# Patient Record
Sex: Female | Born: 1980 | Race: White | Hispanic: No | Marital: Single | Smoking: Former smoker
Health system: Southern US, Community
[De-identification: ages and names within clinical notes are randomized; demographics above are authoritative.]

## PROBLEM LIST (undated history)

## (undated) DIAGNOSIS — M5030 Other cervical disc degeneration, unspecified cervical region: Secondary | ICD-10-CM

## (undated) DIAGNOSIS — N1330 Unspecified hydronephrosis: Secondary | ICD-10-CM

## (undated) DIAGNOSIS — M791 Myalgia, unspecified site: Secondary | ICD-10-CM

## (undated) DIAGNOSIS — M2550 Pain in unspecified joint: Secondary | ICD-10-CM

## (undated) DIAGNOSIS — Z9114 Patient's other noncompliance with medication regimen: Secondary | ICD-10-CM

## (undated) DIAGNOSIS — E282 Polycystic ovarian syndrome: Secondary | ICD-10-CM

## (undated) DIAGNOSIS — F419 Anxiety disorder, unspecified: Secondary | ICD-10-CM

## (undated) DIAGNOSIS — F329 Major depressive disorder, single episode, unspecified: Secondary | ICD-10-CM

## (undated) DIAGNOSIS — M722 Plantar fascial fibromatosis: Secondary | ICD-10-CM

## (undated) DIAGNOSIS — F909 Attention-deficit hyperactivity disorder, unspecified type: Secondary | ICD-10-CM

## (undated) DIAGNOSIS — R918 Other nonspecific abnormal finding of lung field: Secondary | ICD-10-CM

## (undated) DIAGNOSIS — B009 Herpesviral infection, unspecified: Secondary | ICD-10-CM

## (undated) DIAGNOSIS — Z8719 Personal history of other diseases of the digestive system: Secondary | ICD-10-CM

## (undated) DIAGNOSIS — Z8711 Personal history of peptic ulcer disease: Secondary | ICD-10-CM

## (undated) DIAGNOSIS — K5190 Ulcerative colitis, unspecified, without complications: Secondary | ICD-10-CM

## (undated) HISTORY — DX: Pain in unspecified joint: M25.50

## (undated) HISTORY — DX: Other cervical disc degeneration, unspecified cervical region: M50.30

## (undated) HISTORY — DX: Personal history of other diseases of the digestive system: Z87.19

## (undated) HISTORY — DX: Unspecified hydronephrosis: N13.30

## (undated) HISTORY — DX: Myalgia, unspecified site: M79.10

## (undated) HISTORY — DX: Other nonspecific abnormal finding of lung field: R91.8

## (undated) HISTORY — PX: OTHER SURGICAL HISTORY: SHX169

## (undated) HISTORY — DX: Anxiety disorder, unspecified: F41.9

## (undated) HISTORY — DX: Attention-deficit hyperactivity disorder, unspecified type: F90.9

## (undated) HISTORY — DX: Plantar fascial fibromatosis: M72.2

## (undated) HISTORY — PX: PILONIDAL CYST EXCISION: SHX744

## (undated) HISTORY — DX: Herpesviral infection, unspecified: B00.9

## (undated) HISTORY — DX: Patient's other noncompliance with medication regimen: Z91.14

## (undated) HISTORY — DX: Major depressive disorder, single episode, unspecified: F32.9

## (undated) HISTORY — DX: Personal history of peptic ulcer disease: Z87.11

---
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
## 2006-12-09 ENCOUNTER — Observation Stay: Payer: Self-pay | Admitting: Obstetrics and Gynecology

## 2007-03-17 ENCOUNTER — Observation Stay: Payer: Self-pay | Admitting: Obstetrics and Gynecology

## 2007-03-27 ENCOUNTER — Inpatient Hospital Stay: Payer: Self-pay | Admitting: Obstetrics and Gynecology

## 2013-12-28 LAB — HM PAP SMEAR: HM Pap smear: NEGATIVE

## 2015-04-30 ENCOUNTER — Telehealth: Payer: Self-pay | Admitting: Obstetrics and Gynecology

## 2015-04-30 NOTE — Telephone Encounter (Signed)
[facility] [address] [address]

## 2015-04-30 NOTE — Telephone Encounter (Signed)
Patient would like a topical cream sent in for her hsv

## 2015-04-30 NOTE — Telephone Encounter (Signed)
PT CALLED AND WOULD LIKE A CALL BACK SHE NEEDS A REFILL CALLED IN FOR HER BUT TO A DIFFERENT PHARMACY. PT DID NOT TELL ME WHAT THE NEW PHARMACY IS ONLY THAT SHE WOULD LIKE A CALL BACK FROM YOU

## 2015-04-30 NOTE — Telephone Encounter (Signed)
Left message for patient to return call.

## 2015-05-01 ENCOUNTER — Other Ambulatory Visit: Payer: Self-pay | Admitting: Obstetrics and Gynecology

## 2015-05-01 DIAGNOSIS — R768 Other specified abnormal immunological findings in serum: Secondary | ICD-10-CM

## 2015-05-01 MED ORDER — ACYCLOVIR 5 % EX OINT: 1.0000 "application " | TOPICAL_OINTMENT | CUTANEOUS | Status: DC

## 2015-05-01 NOTE — Telephone Encounter (Signed)
Notified patient medication sent to pharmacy

## 2015-05-01 NOTE — Telephone Encounter (Signed)
Please let her know it was sent in today

## 2015-05-07 ENCOUNTER — Other Ambulatory Visit: Payer: Self-pay | Admitting: Obstetrics and Gynecology

## 2015-05-07 ENCOUNTER — Telehealth: Payer: Self-pay | Admitting: Obstetrics and Gynecology

## 2015-05-07 DIAGNOSIS — F988 Other specified behavioral and emotional disorders with onset usually occurring in childhood and adolescence: Secondary | ICD-10-CM

## 2015-05-07 MED ORDER — AMPHETAMINE-DEXTROAMPHETAMINE 20 MG PO TABS: 20.0000 mg | ORAL_TABLET | Freq: Two times a day (BID) | ORAL | Status: DC

## 2015-05-07 NOTE — Telephone Encounter (Signed)
thanks

## 2015-05-07 NOTE — Telephone Encounter (Signed)
Notified pt rx ready for pick up

## 2015-05-07 NOTE — Telephone Encounter (Signed)
Please let her know they are ready for pickup

## 2015-05-07 NOTE — Telephone Encounter (Signed)
Needs refill on adderal please

## 2015-05-08 NOTE — Telephone Encounter (Signed)
DONE. TINA

## 2015-05-21 ENCOUNTER — Ambulatory Visit (INDEPENDENT_AMBULATORY_CARE_PROVIDER_SITE_OTHER): Payer: BLUE CROSS/BLUE SHIELD | Admitting: Psychiatry

## 2015-05-21 ENCOUNTER — Encounter: Payer: Self-pay | Admitting: Psychiatry

## 2015-05-21 DIAGNOSIS — F331 Major depressive disorder, recurrent, moderate: Secondary | ICD-10-CM

## 2015-05-21 MED ORDER — BUPROPION HCL ER (XL) 300 MG PO TB24: 300.0000 mg | ORAL_TABLET | Freq: Every day | ORAL | Status: DC

## 2015-05-21 MED ORDER — QUETIAPINE FUMARATE 50 MG PO TABS: ORAL_TABLET | ORAL | Status: DC

## 2015-05-21 MED ORDER — ESCITALOPRAM OXALATE 20 MG PO TABS: 20.0000 mg | ORAL_TABLET | Freq: Every day | ORAL | Status: DC

## 2015-05-21 NOTE — Progress Notes (Signed)
Psychiatric Initial Adult Assessment   Patient Identification: Tracy Whitaker MRN:  [id number] Date of Evaluation:  05/21/2015 Referral Source: PCP Chief Complaint:  "I had a relationship just and in February." Chief Complaint    Depression     Visit Diagnosis: No diagnosis found. Diagnosis:  There are no active problems to display for this patient.  History of Present Illness:  Patient indicates that she had a relationship and in February after a 10 year period. She indicates she has an 69-year-old daughter with this person. He stated that since then she's been experiencing depressed mood, crying all the time, lack of interest, poor appetite and difficulty sleeping. She states she has been on Lexapro for 2 years and that her primary care added Wellbutrin last month. Patient indicates that the Wellbutrin has been somewhat helpful for the depression. She also states her primary care added Seroquel last month as well and that is helped her with her sleep. Of note patient indicates that she shares the custody of the daughter with this ex boyfriend and that she has now found out that the boyfriend has moved on and has a another girlfriend living in the home. She states this bothers her very much. She does still see her daughter as they split the days up evenly as to who the daughter stays with.  She indicated that in February and March she was somewhat hyper, was going out every night and had risky sexual behavior. She also states she was drinking heavily at that time and would drink until she blacked out and drink enough such that she had occasions where she fell asleep in the shower. She states that that level of drinking ended at the end of March and currently she consumes 3-4 beers a week.  Elements:  Duration:  Discussed above.. Associated Signs/Symptoms: Depression Symptoms:  depressed mood, anhedonia, insomnia, loss of energy/fatigue, disturbed sleep, decreased appetite, (Hypo) Manic  Symptoms:  Irritable Mood, Sexually Inapproprite Behavior, Decreased need for sleep. Ever patient relates these symptoms indicated at the end of March 2016 also concurrent with her stopping heavy use of alcohol. Anxiety Symptoms:  Panic Symptoms, Psychotic Symptoms:  none PTSD Symptoms: Negative  Past Medical History: No past medical history on file. No past surgical history on file. Family History: No family history on file. Social History:   History   Social History  . Marital Status: Single    Spouse Name: N/A  . Number of Children: N/A  . Years of Education: N/A   Social History Main Topics  . Smoking status: Not on file  . Smokeless tobacco: Not on file  . Alcohol Use: Not on file  . Drug Use: Not on file  . Sexual Activity: Not on file   Other Topics Concern  . Not on file   Social History Narrative  . No narrative on file   Additional Social History: Patient has been married in the past and divorced in 2003 areas she has an 81-year-old daughter from a long-term 10 year relationship.  Asian describes her childhood as good and denies any forms of abuse. She states her parents continue to be married. She has a younger sister. 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 She went to the [facility] dental assistance program and was working as a Art therapist up until recently.  She states that she had quit cigarettes for 2 years but then resumed smoking in December 2015, currently using 1 pack per day. She  denies any use of illicit drugs aside from the alcohol use discussed above.  Musculoskeletal: Strength & Muscle Tone: within normal limits Gait & Station: normal Patient leans: N/A  Psychiatric Specialty Exam: HPI  ROS  There were no vitals taken for this visit.There is no height or weight on file to calculate BMI.  General Appearance: Neat and Well Groomed  Eye Contact:  Good  Speech:  Normal Rate  Volume:  Normal  Mood:  Crying all the  time  Affect:  Depressed and Tearful  Thought Process:  Linear and Logical  Orientation:  Full (Time, Place, and Person)  Thought Content:  Negative  Suicidal Thoughts:  No  Homicidal Thoughts:  No  Memory:  Immediate;   Good Recent;   Good Remote;   Good  Judgement:  Good  Insight:  Good  Psychomotor Activity:  Negative  Concentration:  Good  Recall:  Good  Fund of Knowledge:Good  Language: Good  Akathisia:  Negative  Handed:  Right unknown   AIMS (if indicated): done, normal, no dentures or partials  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  No. Has the patient been a risk to self in the past 6 months?  No. Has the patient been a risk to self within the distant past?  Yes.   cut wrist in 2002 Is the patient a risk to others?  No. Has the patient been a risk to others in the past 6 months?  No. Has the patient been a risk to others within the distant past?  No.  Allergies:  No Known Allergies Current Medications: Current Outpatient Prescriptions  Medication Sig Dispense Refill  . acyclovir ointment (ZOVIRAX) 5 % Apply 1 application topically every 3 (three) hours. 15 g 4  . amphetamine-dextroamphetamine (ADDERALL) 20 MG tablet Take 1 tablet (20 mg total) by mouth 2 (two) times daily. 60 tablet 0  . buPROPion (WELLBUTRIN XL) 300 MG 24 hr tablet Take 1 tablet (300 mg total) by mouth daily. 30 tablet 0  . escitalopram (LEXAPRO) 20 MG tablet Take 1 tablet (20 mg total) by mouth daily. 30 tablet 0  . QUEtiapine (SEROQUEL) 50 MG tablet Take 2 tablets at night at bedtime. Use one half a tablet twice a day as needed for anxiety. 90 tablet 0   No current facility-administered medications for this visit.    Previous Psychotropic Medications: Yes   Substance Abuse History in the last 12 months:  Yes.    Consequences of Substance Abuse: Blackouts:     Medical Decision Making:  Established Problem, Worsening (2), Review of Medication Regimen & Side Effects (2) and Review of New Medication or Change in Dosage (2)  Treatment Plan Summary: Medication management we will increase her Wellbutrin XL from 150 mg a day to 300 mg a day. We will increase her Seroquel from 50 mg at bedtime to 100 mg at bedtime. We will continue her Lexapro at 20 mg daily. Patient will follow up in 1 month. I have also encouraged patient to make an appointment with her therapist to discuss relationship issues develop coping skills.  Guards to risk assessment the patient does have a past distant suicide attempt in 2002. Additional risk factors are affective illness and race. Protective factors are minor child that lives with her, female gender, reporting some benefit from medication, no current active substance use  disorder, forward thinking and describes being in a relationship at this time. At this time low risk of imminent harm to herself or others.    Faith Rogue 6/29/20162:24 PM

## 2015-05-27 ENCOUNTER — Ambulatory Visit (INDEPENDENT_AMBULATORY_CARE_PROVIDER_SITE_OTHER): Payer: BLUE CROSS/BLUE SHIELD | Admitting: Licensed Clinical Social Worker

## 2015-05-27 DIAGNOSIS — F331 Major depressive disorder, recurrent, moderate: Secondary | ICD-10-CM

## 2015-05-27 NOTE — Progress Notes (Signed)
Initial Assessment  Patient:   Tracy Whitaker   DOB:   September 12, 1981  MR Number:  [id number]  Location:  [facility], [address], [place], [place] [address]  Date of Service:   05/27/15  Start Time:   2:20 p.m. End Time:   3:30 p.m.  Provider/Observer:  Miguel Dibble, MSW, LCSW  Billing Code/Service:  [phone]  Chief Complaint:  "I cry all the time. I am sad."   Chief Complaint  Patient presents with  . Depression  . Anxiety  . Family Problem    Reason for Service:  Client here for initial outpatient therapy to learn effective strategies for managing symptoms of sadness, depression, anxiety and grief associated with loss of a long term relationship.  Current Status:  Symptomatic  Reliability of Information: Reliable  Behavioral Observation: Tracy Whitaker  presents as a 34 y.o.-year-old single white female here on referral from Dr. Jimmye Norman who she saw recently for medication evaluation.  No previous outpatient therapy per client however, in 2003, she reports that she had an inpatient hospital stay after mixing a large amount of alcohol and Klonopin following the ending of her marriage.  She saw a Psychiatrist for a while as an outpatient and then stopped.  Zenda reported "I stopped all of my Bi-Polar medicine and I was fine."  "I cry all the time. I am sad."  "My cat got attacked by a pitbull dog."  After several medical procedures the cat died.  She described her then boyfriend as non-supportive and mean to her rather than offering compassion.  The event that precipitated presenting symptoms and grief reaction started in February 2016, when herthen long term boyfriend of 10 and 1/2 years became emotionally and verbally neglectful and informed client that he did not love her or want to continue the relationship.  She and their 53 year old daughter were living with him in a house that he bought.  As a result of the break up she did  not have the financial resources to move into a place so she and daughter stayed temporarily with a friend.  She and daughter now share a one bedroom home and apparently client recently moved in her boyfriend into the home.  Her estranged ex is dating a woman and the woman and her son are living with him in the home.  Client was very tearful when stating "I feel like I'm being replaced."  Client voiced satisfaction that at least the girlfriend is kind to their daughter and although the ex continues to text client she admits "I would not want to go back with him."  Additional sadness around loss of an ideal which client described as the loss of having a family like she grew up in with parents still together after 74 plus years and her sister is also married with a family.  Additionally, there was reported infidelity in the relationship which client may not have completely dealt with.  Not wanting to leave her house and stated "I either sleep all the time or not at all."  She was extremely tearful in session. "I guess I just feel worthless at this time."  Denied feeling anxiety going to work since she has been there for 10 years and co-workers and boss are described as very supportive.  She is having panic attacks in stores to the point that she has had to leave a few times and describes self as feeling really  nervous.  Other symptoms per client include: irritable mood, rubbing places on her hands due to nervousness. Symptoms do not indicate SIB and no SI or HI indicated.  Client does not report manic symptoms.    She was receptive to therapy process and voiced agreement and understanding of role of understanding feelings and thoughts associated with loss and importance of self-care and increasing realistic expectations of self and others.  Tracy Whitaker agreed to read hand-outs provided and motivated to return to OPT.  Interactions:    Active   Attention:   within normal limits  Memory:   within normal  limits  Visuo-spatial:   within normal limits  Speech (Volume):  normal  Speech:   normal volume  Thought Process:  Relevant  Though Content:  WNL  Orientation:   person, place, time/date, situation, day of week, month of year and year  Judgment:   Fair  Planning:   Fair  Affect:    Anxious, Depressed and Tearful  Mood:    Anxious and Depressed  Insight:   Fair  Intelligence:   normal  Marital Status/Living: Singe/Living with current boyfriend whom she has known for years. The two live together with client's 42 year old daughter, Minette Brine, in the home 50% of the time and with her father the other 50% of the time.  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  Past Employment:    Substance Use:  There is a documented history of alcohol abuse confirmed by the patient.  She admits that she was drinking heavily to point of blacking out.  The time frame was about 4-6 weeks.  Denied as current behaviors.  Education:   Engineering geologist certificate for Art therapist  Medical History:  No past medical history on file. Polycystic Fibrosis (history of miscarriage)      Current outpatient prescriptions:  .  acyclovir ointment (ZOVIRAX) 5 %, Apply 1 application topically every 3 (three) hours., Disp: 15 g, Rfl: 4 .  amphetamine-dextroamphetamine (ADDERALL) 20 MG tablet, Take 1 tablet (20 mg total) by mouth 2 (two) times daily., Disp: 60 tablet, Rfl: 0 .  buPROPion (WELLBUTRIN XL) 300 MG 24 hr tablet, Take 1 tablet (300 mg total) by mouth daily., Disp: 30 tablet, Rfl: 0 .  escitalopram (LEXAPRO) 20 MG tablet, Take 1 tablet (20 mg total) by mouth daily., Disp: 30 tablet, Rfl: 0 .  QUEtiapine (SEROQUEL) 50 MG tablet, Take 1-1/2 tablets at bedtime for 1 week and then increase to 2 tablets at bedtime. Take one half a tablet twice a day as  needed for anxiety., Disp: 90 tablet, Rfl: 0  Sexual History:   History  Sexual Activity  . Sexual Activity: Not on file    Abuse/Trauma History: Denied  Psychiatric History:  Was hospitalized in 2003 after mixing Klonopin and alcohol and saw a Psychiatrist for a short time. Took Lexapro in the past.  Family Med/Psych History: No family history on file.  Risk of Suicide/Violence: low :  Protective factors: race, age, loves her daughter, family support, employed, motivated to get better  Therapist Response/Interventions:  LCSW provided client with supportive therapy with insight along with emotional and social support to establish trust and rapport.  Reviewed psychotherapy as a process of self discovery, healing and a place to gain additional self care and coping skills.  LCSW offered psycho-education on factors  that may contribute to client's ongoing depressive symptoms that appear to include real and perceived feelings of worthlessness and rejection.  Education and support around grieving loss of both relationship and the idea of having her own family and associated grief reactions.  Normalized client's feelings and offered her hand outs that discuss healing from a separation and divorce along with common thinking errors that contribute to depression and anxiety symptoms.  Encouraged client to review information for [place] to discuss at next session.  Explored with client and processed identified thoughts, feelings and fears associated with both real and imagined rejection and abandonment in personal relationships.  Supportive therapy to validate the discomfort of current situation while also normalizing this as part of disappointments, fears, confusion and other emotions when a relationship ends.  Impression/DX:  Major Depressive Disorder, Recurrent, Moderate     Anxiety  Plan:  Follow up for OPT in one week. LCSW will coordinate client's care with Dr. Jimmye Norman to maximize benefits of treatment  outcomes.  Client will keep all scheduled appointments and take medications as prescribed.  Tracy Whitaker will read all materials provided and complete homework as assigned between therapy sessions.  Diagnosis:   Major Depressive Disorder, Recurrent, Moderate

## 2015-06-06 ENCOUNTER — Ambulatory Visit: Payer: BLUE CROSS/BLUE SHIELD | Admitting: Licensed Clinical Social Worker

## 2015-06-16 ENCOUNTER — Ambulatory Visit: Payer: BLUE CROSS/BLUE SHIELD | Admitting: Psychiatry

## 2015-06-17 ENCOUNTER — Other Ambulatory Visit: Payer: Self-pay

## 2015-06-17 DIAGNOSIS — F331 Major depressive disorder, recurrent, moderate: Secondary | ICD-10-CM

## 2015-06-17 MED ORDER — QUETIAPINE FUMARATE 50 MG PO TABS: ORAL_TABLET | ORAL | Status: DC

## 2015-06-17 MED ORDER — BUPROPION HCL ER (XL) 300 MG PO TB24: 300.0000 mg | ORAL_TABLET | Freq: Every day | ORAL | Status: DC

## 2015-06-17 NOTE — Telephone Encounter (Signed)
received a fax requesting a refill on bupropion hcl xl 300mg  take 1 tablet by mouth daily. and on quetiapine fumarate 50mg  take 2 tablets at night at betime use one half a tablet twice da day as needed for anxiety.  Pt was seen on  06-16-15.

## 2015-06-18 NOTE — Telephone Encounter (Signed)
faxed rx to pharmacy. and i called pt and let them know to call office back to r/s appt that was missed.

## 2015-07-22 ENCOUNTER — Emergency Department: Payer: BLUE CROSS/BLUE SHIELD

## 2015-07-22 ENCOUNTER — Telehealth: Payer: Self-pay | Admitting: Obstetrics and Gynecology

## 2015-07-22 ENCOUNTER — Emergency Department
Admission: EM | Admit: 2015-07-22 | Discharge: 2015-07-22 | Disposition: A | Discharge status: Home / Self Care | Payer: BLUE CROSS/BLUE SHIELD | Attending: Emergency Medicine | Admitting: Emergency Medicine

## 2015-07-22 ENCOUNTER — Encounter: Payer: Self-pay | Admitting: Emergency Medicine

## 2015-07-22 DIAGNOSIS — S8011XA Contusion of right lower leg, initial encounter: Secondary | ICD-10-CM

## 2015-07-22 DIAGNOSIS — Z720 Tobacco use: Secondary | ICD-10-CM | POA: Insufficient documentation

## 2015-07-22 DIAGNOSIS — M7981 Nontraumatic hematoma of soft tissue: Secondary | ICD-10-CM | POA: Diagnosis not present

## 2015-07-22 DIAGNOSIS — T148XXA Other injury of unspecified body region, initial encounter: Secondary | ICD-10-CM

## 2015-07-22 DIAGNOSIS — Z79899 Other long term (current) drug therapy: Secondary | ICD-10-CM | POA: Diagnosis not present

## 2015-07-22 DIAGNOSIS — R51 Headache: Secondary | ICD-10-CM | POA: Insufficient documentation

## 2015-07-22 DIAGNOSIS — M79604 Pain in right leg: Secondary | ICD-10-CM | POA: Diagnosis present

## 2015-07-22 HISTORY — DX: Ulcerative colitis, unspecified, without complications: K51.90

## 2015-07-22 HISTORY — DX: Polycystic ovarian syndrome: E28.2

## 2015-07-22 NOTE — Telephone Encounter (Signed)
Still needs to be seen at ED or urgent care

## 2015-07-22 NOTE — ED Provider Notes (Signed)
[facility] Emergency Department Provider Note  ____________________________________________  Time seen: Approximately 2:00 PM  I have reviewed the triage vital signs and the nursing notes.   HISTORY  Chief Complaint Leg Pain    HPI Tracy Whitaker is a 34 y.o. female with a past medical history that includes ulcerative colitis and polycystic ovarian disease who presents with swelling and bruising to the medial aspect of her right calf.  She denies any trauma.  She states that she noticed acutely about 2 days ago and it has been persistent and "changed color "since that time.  It is mildly painful and tender to touch.  It does not radiate away from the site which is about the diameter of the palm of her hand.  It is firm to the touch.  She has been worrying about it and has also felt like she has a headache recently and just felt "not quite right".  She decided she should get it checked out.  Nothing makes it better but she has not tried taking any medications for it nor using any ice packs.  Nothing makes it worse.   Past Medical History  Diagnosis Date  . Ulcerative colitis   . Polycystic disease, ovaries     There are no active problems to display for this patient.   History reviewed. No pertinent past surgical history.  Current Outpatient Rx  Name  Route  Sig  Dispense  Refill  . acyclovir ointment (ZOVIRAX) 5 %   Topical   Apply 1 application topically every 3 (three) hours.   15 g   4   . amphetamine-dextroamphetamine (ADDERALL) 20 MG tablet   Oral   Take 1 tablet (20 mg total) by mouth 2 (two) times daily.   60 tablet   0   . buPROPion (WELLBUTRIN XL) 300 MG 24 hr tablet   Oral   Take 1 tablet (300 mg total) by mouth daily.   30 tablet   0   . escitalopram (LEXAPRO) 20 MG tablet   Oral   Take 1 tablet (20 mg total) by mouth daily.   30 tablet   0   . mesalamine (ASACOL) 400 MG EC tablet   Oral   Take 800 mg by mouth 3 (three)  times daily.         . QUEtiapine (SEROQUEL) 50 MG tablet      2 tablets at bedtime. Take one half a tablet twice a day as needed for anxiety.   90 tablet   0     Allergies Review of patient's allergies indicates no known allergies.  History reviewed. No pertinent family history.  Social History Social History  Substance Use Topics  . Smoking status: Current Every Day Smoker -- 1.00 packs/day    Types: Cigarettes  . Smokeless tobacco: None  . Alcohol Use: 2.4 oz/week    4 Standard drinks or equivalent per week    Review of Systems Constitutional: No fever/chills Eyes: No visual changes. ENT: No sore throat. Cardiovascular: Denies chest pain. Respiratory: Denies shortness of breath. Gastrointestinal: No abdominal pain.  No nausea, no vomiting.  No diarrhea.  No constipation. Genitourinary: Negative for dysuria. Musculoskeletal: Negative for back pain.  Mild pain in the right inner calf at the site of the swelling and discoloration Skin: Negative for rash. Neurological: Occasional mild headaches, no focal weakness or numbness.  10-point ROS otherwise negative.  ____________________________________________   PHYSICAL EXAM:  VITAL SIGNS: ED Triage Vitals  Enc  Vitals Group     BP 07/22/15 1219 116/74 mmHg     Pulse Rate 07/22/15 1219 94     Resp 07/22/15 1219 16     Temp 07/22/15 1219 98.8 F (37.1 C)     Temp Source 07/22/15 1219 Oral     SpO2 07/22/15 1219 100 %     Weight 07/22/15 1219 180 lb (81.647 kg)     Height 07/22/15 1219 5\' 4"  (1.626 m)     Head Cir --      Peak Flow --      Pain Score 07/22/15 1352 5     Pain Loc --      Pain Edu? --      Excl. in [place]? --     Constitutional: Alert and oriented. Well appearing and in no acute distress. Eyes: Conjunctivae are normal. PERRL. EOMI. Head: Atraumatic. Neck: No stridor.   Cardiovascular: Normal rate, regular rhythm. Grossly normal heart sounds.  Good peripheral circulation.  Normal capillary refill  in distal extremities including in the right toes. Respiratory: Normal respiratory effort.  No retractions. Lungs CTAB. Gastrointestinal: Soft and nontender. No distention. No abdominal bruits. No CVA tenderness. Musculoskeletal: Subacute ecchymosis and hematoma about the size of the palm of the patient's hand on her right occipital medial calf.  No evidence of erythema/cellulitis.  It is firm like a hematoma but still easily palpable with easily compressible compartments and no reported tenderness to palpation.  She is neurovascularly intact distal to the lesion.  There is no popliteal tenderness or swelling.  Left lower extremity is unremarkable Neurologic:  Normal speech and language. No gross focal neurologic deficits are appreciated.  Skin:  Skin is warm, dry and intact. No rash noted. Psychiatric: Mood and affect are normal. Speech and behavior are normal.  ____________________________________________   LABS (all labs ordered are listed, but only abnormal results are displayed)  Not indicated ____________________________________________  EKG  Not indicated ____________________________________________  RADIOLOGY   US Venous Img Lower Unilateral Right  07/22/2015   CLINICAL DATA:  Right leg pain and swelling for 3 days  EXAM: Right LOWER EXTREMITY VENOUS DOPPLER ULTRASOUND  TECHNIQUE: Gray-scale sonography with graded compression, as well as color Doppler and duplex ultrasound were performed to evaluate the lower extremity deep venous systems from the level of the common femoral vein and including the common femoral, femoral, profunda femoral, popliteal and calf veins including the posterior tibial, peroneal and gastrocnemius veins when visible. The superficial great saphenous vein was also interrogated. Spectral Doppler was utilized to evaluate flow at rest and with distal augmentation maneuvers in the common femoral, femoral and popliteal veins.  COMPARISON:  None.  FINDINGS:  Contralateral Common Femoral Vein: Respiratory phasicity is normal and symmetric with the symptomatic side. No evidence of thrombus. Normal compressibility.  Common Femoral Vein: No evidence of thrombus. Normal compressibility, respiratory phasicity and response to augmentation.  Saphenofemoral Junction: No evidence of thrombus.  Profunda Femoral Vein: No evidence of thrombus.  Femoral Vein: No evidence of thrombus.  Popliteal Vein: No evidence of thrombus.  Calf Veins: No evidence of thrombus.  Superficial Great Saphenous Vein: No evidence of thrombus.  IMPRESSION: No evidence of right lower extremity deep venous thrombosis.   Electronically Signed   By: Monte Fantasia M.D.   On: 07/22/2015 14:07    ____________________________________________   PROCEDURES  Procedure(s) performed: None  Critical Care performed: No ____________________________________________   INITIAL IMPRESSION / ASSESSMENT AND PLAN / ED COURSE  Pertinent labs &  imaging results that were available during my care of the patient were reviewed by me and considered in my medical decision making (see chart for details).  The ecchymosis and hematoma looks most consistent with either a minor trauma that the patient does not remember or a spontaneous rupture of a small blood vessel.  Either way it is nontender, has soft compartments, she is neurovascularly intact distal to the lesion, and there is no sign of infection.  I highly doubt deep vein thrombosis but given that she is on birth control pills and is a tobacco user it is reasonable to obtain the study.  Anticipating that the ultrasound will be negative I plan for discharge with normal recommendations and return precautions.  ____________________________________________  FINAL CLINICAL IMPRESSION(S) / ED DIAGNOSES  Final diagnoses:  Contusion of muscle  Hematoma of leg, right, initial encounter      NEW MEDICATIONS STARTED DURING THIS VISIT:  New Prescriptions   No  medications on file     Hinda Kehr, MD 07/22/15 [phone]

## 2015-07-22 NOTE — Telephone Encounter (Signed)
Patient called stating she thinks she may have a busted blood vessel or blood clot on the inside calf area of her leg. She stated its bruised purple and blue and red. Shes not sure if she has a fever but she feels hot and clammy. She does not have a PCP. Please Advise

## 2015-07-22 NOTE — Discharge Instructions (Signed)
Hematoma A hematoma is a collection of blood under the skin, in an organ, in a body space, in a joint space, or in other tissue. The blood can clot to form a lump that you can see and feel. The lump is often firm and may sometimes become sore and tender. Most hematomas get better in a few days to weeks. However, some hematomas may be serious and require medical care. Hematomas can range in size from very small to very large. CAUSES  A hematoma can be caused by a blunt or penetrating injury. It can also be caused by spontaneous leakage from a blood vessel under the skin. Spontaneous leakage from a blood vessel is more likely to occur in older people, especially those taking blood thinners. Sometimes, a hematoma can develop after certain medical procedures. SIGNS AND SYMPTOMS   A firm lump on the body.  Possible pain and tenderness in the area.  Bruising.Blue, dark blue, purple-red, or yellowish skin may appear at the site of the hematoma if the hematoma is close to the surface of the skin. For hematomas in deeper tissues or body spaces, the signs and symptoms may be subtle. For example, an intra-abdominal hematoma may cause abdominal pain, weakness, fainting, and shortness of breath. An intracranial hematoma may cause a headache or symptoms such as weakness, trouble speaking, or a change in consciousness. DIAGNOSIS  A hematoma can usually be diagnosed based on your medical history and a physical exam. Imaging tests may be needed if your health care provider suspects a hematoma in deeper tissues or body spaces, such as the abdomen, head, or chest. These tests may include ultrasonography or a CT scan.  TREATMENT  Hematomas usually go away on their own over time. Rarely does the blood need to be drained out of the body. Large hematomas or those that may affect vital organs will sometimes need surgical drainage or monitoring. HOME CARE INSTRUCTIONS   Apply ice to the injured area:   Put ice in a  plastic bag.   Place a towel between your skin and the bag.   Leave the ice on for 20 minutes, 2-3 times a day for the first 1 to 2 days.   After the first 2 days, switch to using warm compresses on the hematoma.   Elevate the injured area to help decrease pain and swelling. Wrapping the area with an elastic bandage may also be helpful. Compression helps to reduce swelling and promotes shrinking of the hematoma. Make sure the bandage is not wrapped too tight.   If your hematoma is on a lower extremity and is painful, crutches may be helpful for a couple days.   Only take over-the-counter or prescription medicines as directed by your health care provider. SEEK IMMEDIATE MEDICAL CARE IF:   You have increasing pain, or your pain is not controlled with medicine.   You have a fever.   You have worsening swelling or discoloration.   Your skin over the hematoma breaks or starts bleeding.   Your hematoma is in your chest or abdomen and you have weakness, shortness of breath, or a change in consciousness.  Your hematoma is on your scalp (caused by a fall or injury) and you have a worsening headache or a change in alertness or consciousness. MAKE SURE YOU:   Understand these instructions.  Will watch your condition.  Will get help right away if you are not doing well or get worse. Document Released: 06/22/2004 Document Revised: 07/11/2013 Document Reviewed: 04/18/2013   Exit[employer] Patient Information 2015 [place]. This information is not intended to replace advice given to you by your health care provider. Make sure you discuss any questions you have with your health care provider.  Contusion A contusion is a deep bruise. Contusions are the result of an injury that caused bleeding under the skin. The contusion may turn blue, purple, or yellow. Minor injuries will give you a painless contusion, but more severe contusions may stay painful and swollen for a few weeks.  CAUSES    A contusion is usually caused by a blow, trauma, or direct force to an area of the body. SYMPTOMS   Swelling and redness of the injured area.  Bruising of the injured area.  Tenderness and soreness of the injured area.  Pain. DIAGNOSIS  The diagnosis can be made by taking a history and physical exam. An X-ray, CT scan, or MRI may be needed to determine if there were any associated injuries, such as fractures. TREATMENT  Specific treatment will depend on what area of the body was injured. In general, the best treatment for a contusion is resting, icing, elevating, and applying cold compresses to the injured area. Over-the-counter medicines may also be recommended for pain control. Ask your caregiver what the best treatment is for your contusion. HOME CARE INSTRUCTIONS   Put ice on the injured area.  Put ice in a plastic bag.  Place a towel between your skin and the bag.  Leave the ice on for 15-20 minutes, 3-4 times a day, or as directed by your health care provider.  Only take over-the-counter or prescription medicines for pain, discomfort, or fever as directed by your caregiver. Your caregiver may recommend avoiding anti-inflammatory medicines (aspirin, ibuprofen, and naproxen) for 48 hours because these medicines may increase bruising.  Rest the injured area.  If possible, elevate the injured area to reduce swelling. SEEK IMMEDIATE MEDICAL CARE IF:   You have increased bruising or swelling.  You have pain that is getting worse.  Your swelling or pain is not relieved with medicines. MAKE SURE YOU:   Understand these instructions.  Will watch your condition.  Will get help right away if you are not doing well or get worse. Document Released: 08/18/2005 Document Revised: 11/13/2013 Document Reviewed: 09/13/2011 [facility] Patient Information 2015 [place], [place]. This information is not intended to replace advice given to you by your health care provider. Make sure you  discuss any questions you have with your health care provider.

## 2015-07-22 NOTE — Telephone Encounter (Signed)
Needs to go to ED or fast Med, as we cannot do ultrasound to rule out blood clot here, wheich is what she needs

## 2015-07-22 NOTE — Telephone Encounter (Signed)
Patient notified

## 2015-07-22 NOTE — ED Notes (Signed)
[place] gave preliminary report- negative for DVT in RLE

## 2015-07-22 NOTE — ED Notes (Signed)
Right lower leg pain and swelling onset Sunday.  Ambulates well.

## 2015-08-11 ENCOUNTER — Other Ambulatory Visit: Payer: Self-pay | Admitting: Obstetrics and Gynecology

## 2015-08-11 ENCOUNTER — Telehealth: Payer: Self-pay | Admitting: Obstetrics and Gynecology

## 2015-08-11 MED ORDER — AMPHETAMINE-DEXTROAMPHETAMINE 20 MG PO TABS: 20.0000 mg | ORAL_TABLET | Freq: Two times a day (BID) | ORAL | Status: DC

## 2015-08-11 NOTE — Telephone Encounter (Signed)
Please let her know she can pick her prescription up

## 2015-08-11 NOTE — Telephone Encounter (Signed)
pls advise

## 2015-08-11 NOTE — Telephone Encounter (Signed)
Patient called requesting a refill on adderral.Thanks

## 2015-08-12 NOTE — Telephone Encounter (Signed)
Left detailed message rx ready for pickup. 

## 2015-09-12 ENCOUNTER — Other Ambulatory Visit: Payer: Self-pay | Admitting: Obstetrics and Gynecology

## 2015-10-28 ENCOUNTER — Telehealth: Payer: Self-pay | Admitting: Obstetrics and Gynecology

## 2015-10-28 NOTE — Telephone Encounter (Signed)
pls advise

## 2015-10-28 NOTE — Telephone Encounter (Signed)
The pharmacy said her seroquil (generic) authorized again before they can fill it   [facility]

## 2015-10-29 NOTE — Telephone Encounter (Signed)
Ok to authorize- did they not send you a form?

## 2015-10-30 NOTE — Telephone Encounter (Signed)
Called and gave early refill

## 2015-10-31 ENCOUNTER — Ambulatory Visit (INDEPENDENT_AMBULATORY_CARE_PROVIDER_SITE_OTHER): Payer: BLUE CROSS/BLUE SHIELD | Admitting: Obstetrics and Gynecology

## 2015-10-31 ENCOUNTER — Ambulatory Visit (INDEPENDENT_AMBULATORY_CARE_PROVIDER_SITE_OTHER): Payer: BLUE CROSS/BLUE SHIELD

## 2015-10-31 ENCOUNTER — Encounter: Payer: Self-pay | Admitting: Obstetrics and Gynecology

## 2015-10-31 VITALS — BP 106/80 | HR 97 | Ht 64.0 in | Wt 187.9 lb

## 2015-10-31 DIAGNOSIS — R1032 Left lower quadrant pain: Secondary | ICD-10-CM | POA: Diagnosis not present

## 2015-10-31 LAB — POCT URINALYSIS DIPSTICK
Bilirubin, UA: NEGATIVE
Glucose, UA: NEGATIVE
Ketones, UA: 15
Leukocytes, UA: NEGATIVE
Nitrite, UA: NEGATIVE
Protein, UA: NEGATIVE
Spec Grav, UA: <=1.005
Urobilinogen, UA: 0.2
pH, UA: 7

## 2015-10-31 MED ORDER — NORGESTREL-ETHINYL ESTRADIOL 0.3-30 MG-MCG PO TABS: 1.0000 | ORAL_TABLET | Freq: Every day | ORAL | Status: DC

## 2015-10-31 MED ORDER — HYDROCODONE-ACETAMINOPHEN 5-325 MG PO TABS: 1.0000 | ORAL_TABLET | Freq: Four times a day (QID) | ORAL | Status: DC | PRN

## 2015-11-12 ENCOUNTER — Telehealth: Payer: Self-pay | Admitting: Obstetrics and Gynecology

## 2015-11-12 ENCOUNTER — Other Ambulatory Visit: Payer: Self-pay | Admitting: Obstetrics and Gynecology

## 2015-11-12 MED ORDER — QUETIAPINE FUMARATE 200 MG PO TABS: 200.0000 mg | ORAL_TABLET | Freq: Every day | ORAL | Status: DC

## 2015-11-12 MED ORDER — AMPHETAMINE-DEXTROAMPHETAMINE 20 MG PO TABS: 20.0000 mg | ORAL_TABLET | Freq: Two times a day (BID) | ORAL | Status: DC

## 2015-11-12 NOTE — Telephone Encounter (Signed)
pls advise

## 2015-11-12 NOTE — Telephone Encounter (Signed)
Please let her know they are ready to pick up

## 2015-11-12 NOTE — Telephone Encounter (Signed)
Left detailed message for pt rx ready

## 2015-11-12 NOTE — Telephone Encounter (Signed)
Patient needs a refill on seroquel [facility] mg [facility] [address] as well as a refill on adderrall, she is aware she will need to pick up the adderrall. Thanks

## 2015-11-14 ENCOUNTER — Other Ambulatory Visit: Payer: Self-pay | Admitting: Obstetrics and Gynecology

## 2015-11-19 ENCOUNTER — Other Ambulatory Visit: Payer: Self-pay | Admitting: *Deleted

## 2015-11-19 ENCOUNTER — Telehealth: Payer: Self-pay

## 2015-11-19 NOTE — Telephone Encounter (Signed)
Needs refill of meds please.  lexapro 20mg  qd Wellbutrin 150mg  qd  [facility] [address]  [employer]

## 2015-11-20 ENCOUNTER — Other Ambulatory Visit: Payer: Self-pay | Admitting: *Deleted

## 2015-11-20 MED ORDER — BUPROPION HCL ER (XL) 150 MG PO TB24: 150.0000 mg | ORAL_TABLET | Freq: Every day | ORAL | Status: DC

## 2015-11-20 NOTE — Telephone Encounter (Signed)
Done-ac 

## 2016-01-13 ENCOUNTER — Other Ambulatory Visit: Payer: Self-pay | Admitting: Obstetrics and Gynecology

## 2016-01-13 ENCOUNTER — Telehealth: Payer: Self-pay | Admitting: Obstetrics and Gynecology

## 2016-01-13 MED ORDER — BUPROPION HCL ER (XL) 150 MG PO TB24: 150.0000 mg | ORAL_TABLET | Freq: Every day | ORAL | Status: DC

## 2016-01-13 MED ORDER — QUETIAPINE FUMARATE 200 MG PO TABS: 200.0000 mg | ORAL_TABLET | Freq: Every day | ORAL | Status: DC

## 2016-01-13 NOTE — Telephone Encounter (Signed)
Tracy Whitaker needs her Seroquel 200 mg and Wellbutrin 150 mg refilled  [facility] [address]  (SHE SAID TODAY)

## 2016-01-13 NOTE — Telephone Encounter (Signed)
Ok to refill 

## 2016-01-18 ENCOUNTER — Other Ambulatory Visit: Payer: Self-pay | Admitting: Obstetrics and Gynecology

## 2016-02-09 ENCOUNTER — Telehealth: Payer: Self-pay | Admitting: Obstetrics and Gynecology

## 2016-02-09 ENCOUNTER — Other Ambulatory Visit (INDEPENDENT_AMBULATORY_CARE_PROVIDER_SITE_OTHER): Payer: BLUE CROSS/BLUE SHIELD

## 2016-02-09 DIAGNOSIS — N390 Urinary tract infection, site not specified: Secondary | ICD-10-CM

## 2016-02-09 LAB — POCT URINALYSIS DIPSTICK
Glucose, UA: NEGATIVE
Ketones, UA: NEGATIVE
Nitrite, UA: POSITIVE
Spec Grav, UA: 1.01
Urobilinogen, UA: 0.2
pH, UA: 6.5

## 2016-02-09 MED ORDER — NITROFURANTOIN MONOHYD MACRO 100 MG PO CAPS: 100.0000 mg | ORAL_CAPSULE | Freq: Two times a day (BID) | ORAL | Status: DC

## 2016-02-09 NOTE — Telephone Encounter (Signed)
PT CALLED AND NEEDS A REFILL ON HER ADDERRALL AND SHE STATED THAT THE WELLBUTRIN IS DIFFERENT SHE THINKS THAT IT IS NOT THE CORRECT DOSE WANTED THAT TO BE CHECKED

## 2016-02-10 ENCOUNTER — Other Ambulatory Visit: Payer: Self-pay | Admitting: Obstetrics and Gynecology

## 2016-02-10 MED ORDER — BUPROPION HCL ER (XL) 300 MG PO TB24: 300.0000 mg | ORAL_TABLET | Freq: Every day | ORAL | Status: DC

## 2016-02-10 MED ORDER — AMPHETAMINE-DEXTROAMPHETAMINE 20 MG PO TABS: 20.0000 mg | ORAL_TABLET | Freq: Two times a day (BID) | ORAL | Status: DC

## 2016-02-10 NOTE — Telephone Encounter (Signed)
Please let her know adderall RX is ready to pickup, and wellbutrin looks correct- may want to check with pharmacy to be sure it is the same that she had picked up previously.

## 2016-02-10 NOTE — Telephone Encounter (Signed)
Done-ac 

## 2016-02-10 NOTE — Telephone Encounter (Signed)
I called Tracy Whitaker states she was taking wellbutrin 340m qd, the last one she picked up was for 1552m she would like to take 30030m

## 2016-02-10 NOTE — Telephone Encounter (Signed)
Please tell her to double up on current script and I sent in another one for 328m.

## 2016-02-11 ENCOUNTER — Other Ambulatory Visit: Payer: Self-pay | Admitting: Obstetrics and Gynecology

## 2016-02-11 LAB — URINE CULTURE

## 2016-03-02 ENCOUNTER — Telehealth: Payer: Self-pay | Admitting: Obstetrics and Gynecology

## 2016-03-02 NOTE — Telephone Encounter (Signed)
She is cryselle and has been spotting for 2 weeks. She would like for you to call her.

## 2016-03-03 ENCOUNTER — Other Ambulatory Visit: Payer: BLUE CROSS/BLUE SHIELD

## 2016-03-03 NOTE — Telephone Encounter (Signed)
Called pt she thinks she has BV is coming by office 03/04/16 to do self obtained swab

## 2016-03-04 ENCOUNTER — Other Ambulatory Visit: Payer: Self-pay | Admitting: Obstetrics and Gynecology

## 2016-03-04 DIAGNOSIS — B9689 Other specified bacterial agents as the cause of diseases classified elsewhere: Secondary | ICD-10-CM

## 2016-03-04 DIAGNOSIS — N760 Acute vaginitis: Secondary | ICD-10-CM | POA: Insufficient documentation

## 2016-03-04 MED ORDER — METRONIDAZOLE 500 MG PO TABS: 500.0000 mg | ORAL_TABLET | Freq: Two times a day (BID) | ORAL | Status: DC

## 2016-03-04 NOTE — Telephone Encounter (Signed)
Pt did self obtained swab here in the office today thinks she has BV Request pills [facility]

## 2016-04-09 ENCOUNTER — Encounter: Payer: Self-pay | Admitting: Obstetrics and Gynecology

## 2016-04-16 ENCOUNTER — Other Ambulatory Visit: Payer: Self-pay | Admitting: Obstetrics and Gynecology

## 2016-04-16 ENCOUNTER — Encounter: Payer: Self-pay | Admitting: Obstetrics and Gynecology

## 2016-04-16 ENCOUNTER — Ambulatory Visit (INDEPENDENT_AMBULATORY_CARE_PROVIDER_SITE_OTHER): Payer: BLUE CROSS/BLUE SHIELD | Admitting: Obstetrics and Gynecology

## 2016-04-16 VITALS — BP 116/74 | HR 83 | Ht 64.0 in | Wt 178.2 lb

## 2016-04-16 DIAGNOSIS — F909 Attention-deficit hyperactivity disorder, unspecified type: Secondary | ICD-10-CM | POA: Insufficient documentation

## 2016-04-16 DIAGNOSIS — M25519 Pain in unspecified shoulder: Secondary | ICD-10-CM | POA: Diagnosis not present

## 2016-04-16 DIAGNOSIS — Z01419 Encounter for gynecological examination (general) (routine) without abnormal findings: Secondary | ICD-10-CM

## 2016-04-16 DIAGNOSIS — E669 Obesity, unspecified: Secondary | ICD-10-CM | POA: Diagnosis not present

## 2016-04-16 NOTE — Patient Instructions (Signed)
  Place annual gynecologic exam patient instructions here.  Thank you for enrolling in [facility]. Please follow the instructions below to securely access your online medical record. MyChart allows you to send messages to your doctor, view your test results, manage appointments, and more.   How Do I Sign Up? 1. In your Internet browser, go to [employer] and enter [email or web address]. 2. Click on the Sign Up Now link in the Sign In box. You will see the New Member Sign Up page. 3. Enter your MyChart Access Code exactly as it appears below. You will not need to use this code after you've completed the sign-up process. If you do not sign up before the expiration date, you must request a new code.  MyChart Access Code: [email or web address] Expires: 06/15/2016  3:20 PM  4. Enter your Social Security Number ([id number]) and Date of Birth (mm/dd/yyyy) as indicated and click Submit. You will be taken to the next sign-up page. 5. Create a MyChart ID. This will be your MyChart login ID and cannot be changed, so think of one that is secure and easy to remember. 6. Create a MyChart password. You can change your password at any time. 7. Enter your Password Reset Question and Answer. This can be used at a later time if you forget your password.  8. Enter your e-mail address. You will receive e-mail notification when new information is available in [place]. 9. Click Sign Up. You can now view your medical record.   Additional Information Remember, MyChart is NOT to be used for urgent needs. For medical emergencies, dial 911.

## 2016-04-16 NOTE — Progress Notes (Signed)
Subjective:   Tracy Whitaker is a 35 y.o. No obstetric history on file. Caucasian female here for a routine well-woman exam.  Patient's last menstrual period was 03/24/2016.    Current complaints: should and neck strain due to large breast size, hoped it would get bettr with weight loss, but hasn't. Causes headaches due to neck strain. PCP: me       does desire labs  Social History: Sexual: heterosexual Marital Status: single Living situation: with daughter Occupation: unknown occupation Tobacco/alcohol: no caffeine use Illicit drugs: no history of illicit drug use  The following portions of the patient's history were reviewed and updated as appropriate: allergies, current medications, past family history, past medical history, past social history, past surgical history and problem list.  Past Medical History Past Medical History  Diagnosis Date  . Ulcerative colitis ([place])   . Polycystic disease, ovaries   . ADHD (attention deficit hyperactivity disorder)     Past Surgical History History reviewed. No pertinent past surgical history.  Gynecologic History No obstetric history on file.  Patient's last menstrual period was 03/24/2016. Contraception: abstinence and OCP (estrogen/progesterone) Last Pap: 2015. Results were: normal   Obstetric History OB History  No data available    Current Medications Current Outpatient Prescriptions on File Prior to Visit  Medication Sig Dispense Refill  . acyclovir ointment (ZOVIRAX) 5 % Apply 1 application topically every 3 (three) hours. 15 g 4  . amphetamine-dextroamphetamine (ADDERALL) 20 MG tablet Take 1 tablet (20 mg total) by mouth 2 (two) times daily. 60 tablet 0  . buPROPion (WELLBUTRIN XL) 300 MG 24 hr tablet Take 1 tablet (300 mg total) by mouth daily. 30 tablet 6  . escitalopram (LEXAPRO) 20 MG tablet TAKE 1 TABLET (20 MG TOTAL) BY MOUTH DAILY. 30 tablet 4  . norgestrel-ethinyl estradiol (LO/OVRAL,CRYSELLE) 0.3-30 MG-MCG  tablet Take 1 tablet by mouth daily. 3 Package 4  . QUEtiapine (SEROQUEL) 200 MG tablet Take 1 tablet (200 mg total) by mouth at bedtime. 30 tablet 4  . HYDROcodone-acetaminophen (NORCO/VICODIN) 5-325 MG tablet Take 1 tablet by mouth every 6 (six) hours as needed. (Patient not taking: Reported on 04/16/2016) 30 tablet 0  . mesalamine (ASACOL) 400 MG EC tablet Take 800 mg by mouth 3 (three) times daily. Reported on 04/16/2016     No current facility-administered medications on file prior to visit.    Review of Systems Patient denies any headaches, blurred vision, shortness of breath, chest pain, abdominal pain, problems with bowel movements, urination, or intercourse.  Objective:  BP 116/74 mmHg  Pulse 83  Ht 5' 4"  (1.626 m)  Wt 178 lb 3.2 oz (80.831 kg)  BMI 30.57 kg/m2  LMP 03/24/2016 Physical Exam  General:  Well developed, well nourished, no acute distress. She is alert and oriented x3. Skin:  Warm and dry Neck:  Midline trachea, no thyromegaly or nodules Cardiovascular: Regular rate and rhythm, no murmur heard Lungs:  Effort normal, all lung fields clear to auscultation bilaterally Breasts:  No dominant palpable mass, retraction, or nipple discharge, large and pendulous, with permenant indentions in shoulder. Abdomen:  Soft, non tender, no hepatosplenomegaly or masses Pelvic:  External genitalia is normal in appearance.  The vagina is normal in appearance. The cervix is bulbous, no CMT.  Thin prep pap is done with HR HPV cotesting. Uterus is felt to be normal size, shape, and contour.  No adnexal masses or tenderness noted. Extremities:  No swelling or varicosities noted Psych:  She has a normal  mood and affect  Assessment:   Healthy well-woman exam ADD Obesity Should pain secondary to breast size Anxiety   Plan:  Labs obtained To restart weight loss plan soon F/U 1 year for AE, or sooner if needed  Tracy Whitaker, CNM

## 2016-04-17 LAB — COMPREHENSIVE METABOLIC PANEL
ALT: 26 IU/L (ref 0–32)
AST: 20 IU/L (ref 0–40)
Albumin/Globulin Ratio: 1.8 (ref 1.2–2.2)
Albumin: 4.3 g/dL (ref 3.5–5.5)
Alkaline Phosphatase: 59 IU/L (ref 39–117)
BUN/Creatinine Ratio: 14 (ref 9–23)
BUN: 10 mg/dL (ref 6–20)
Bilirubin Total: 0.3 mg/dL (ref 0.0–1.2)
CO2: 22 mmol/L (ref 18–29)
Calcium: 9.2 mg/dL (ref 8.7–10.2)
Chloride: 102 mmol/L (ref 96–106)
Creatinine, Ser: 0.73 mg/dL (ref 0.57–1.00)
GFR calc Af Amer: 124 mL/min/{1.73_m2} (ref >59)
GFR calc non Af Amer: 108 mL/min/{1.73_m2} (ref >59)
Globulin, Total: 2.4 g/dL (ref 1.5–4.5)
Glucose: 87 mg/dL (ref 65–99)
Potassium: 4.3 mmol/L (ref 3.5–5.2)
Sodium: 139 mmol/L (ref 134–144)
Total Protein: 6.7 g/dL (ref 6.0–8.5)

## 2016-04-17 LAB — LIPID PANEL
Chol/HDL Ratio: 4 ratio units (ref 0.0–4.4)
Cholesterol, Total: 206 mg/dL — ABNORMAL HIGH (ref 100–199)
HDL: 51 mg/dL (ref >39)
LDL Calculated: 136 mg/dL — ABNORMAL HIGH (ref 0–99)
Triglycerides: 95 mg/dL (ref 0–149)
VLDL Cholesterol Cal: 19 mg/dL (ref 5–40)

## 2016-04-17 LAB — HEMOGLOBIN A1C
Est. average glucose Bld gHb Est-mCnc: 97 mg/dL
Hgb A1c MFr Bld: 5 % (ref 4.8–5.6)

## 2016-04-17 LAB — B12 AND FOLATE PANEL
Folate: 14.2 ng/mL (ref >3.0)
Vitamin B-12: 357 pg/mL (ref 211–946)

## 2016-04-21 LAB — CYTOLOGY - PAP

## 2016-06-14 ENCOUNTER — Telehealth: Payer: Self-pay | Admitting: Obstetrics and Gynecology

## 2016-06-14 NOTE — Telephone Encounter (Signed)
Pt called and wanted to stop by office to do self swab for BV. I explained to pt that nor [facility] nurse nor provider in office but I could send them a message and she could possibly do this tomorrow or she may need an appt.  Pt very short and bluntness noted in her voice demanding an appt between 1pm and 2pm tomorrow and I would need to check for a possible 1:30p. I sent call to front desk and she was offered a 8:15a appt tomorrow and said she could not come then so no appt was scheduled.  Pt called back about 5 min later accepting the 8:15am appt per front desk.

## 2016-06-15 ENCOUNTER — Ambulatory Visit (INDEPENDENT_AMBULATORY_CARE_PROVIDER_SITE_OTHER): Payer: BLUE CROSS/BLUE SHIELD | Admitting: Obstetrics and Gynecology

## 2016-06-15 ENCOUNTER — Encounter: Payer: Self-pay | Admitting: Obstetrics and Gynecology

## 2016-06-15 VITALS — BP 114/79 | HR 97 | Ht 64.5 in | Wt 181.7 lb

## 2016-06-15 DIAGNOSIS — E669 Obesity, unspecified: Secondary | ICD-10-CM | POA: Diagnosis not present

## 2016-06-15 DIAGNOSIS — N760 Acute vaginitis: Secondary | ICD-10-CM

## 2016-06-15 DIAGNOSIS — A499 Bacterial infection, unspecified: Secondary | ICD-10-CM

## 2016-06-15 DIAGNOSIS — B9689 Other specified bacterial agents as the cause of diseases classified elsewhere: Secondary | ICD-10-CM

## 2016-06-15 MED ORDER — METRONIDAZOLE 500 MG PO TABS: 500.0000 mg | ORAL_TABLET | Freq: Two times a day (BID) | ORAL | 3 refills | Status: DC

## 2016-06-15 MED ORDER — PHENTERMINE HCL 37.5 MG PO TABS
37.5000 mg | ORAL_TABLET | Freq: Every day | ORAL | 2 refills | Status: DC
Start: 2016-06-15 — End: 2016-11-09

## 2016-06-15 MED ORDER — CYANOCOBALAMIN 1000 MCG/ML IJ SOLN: 1000.0000 ug | INTRAMUSCULAR | 1 refills | Status: DC

## 2016-06-15 NOTE — Progress Notes (Signed)
CHIEF COMPLAINT/HPI:  35 y.o. female complains of white and malodorous vaginal discharge for 3-4 days day(s). Denies abnormal vaginal bleeding, significant pelvic pain or fever. No UTI symptoms. Sexually active, does not use condoms, no change in partner.  Last unprotected intercourse __ days ago.  Denies history of known exposure to STD or symptoms in partner.  Patient's last menstrual period was 05/30/2016 (exact date).  No history of STD's.  Review of Systems  Constitutional: Negative for fever and chills Eyes: Negative for visual disturbances Respiratory: Negative for shortness of breath, dyspnea Cardiovascular: Negative for chest pain or palpitations  Gastrointestinal: Negative for vomiting, diarrhea and constipation Genitourinary: Negative for dysuria and urgency Musculoskeletal: Negative for back pain, joint pain, myalgias  Neurological: Negative for dizziness and headaches    Past Medical History: Past Medical History:  Diagnosis Date  . ADHD (attention deficit hyperactivity disorder)   . Polycystic disease, ovaries   . Ulcerative colitis ([place])     Past Surgical History: History reviewed. No pertinent surgical history.  Obstetrical History: OB History    Gravida Para Term Preterm AB Living   1             SAB TAB Ectopic Multiple Live Births                  Gynecological History: Pertinent Gynecological History: Menses: flow is light Bleeding: postcoital bleeding occasionally Contraception: OCP (estrogen/progesterone) DES exposure: denies Blood transfusions: none Sexually transmitted diseases: no past history Previous GYN Procedures: NA   Last pap: NA Date: NA    Social History:Social History   Social History  . Marital status: Single    Spouse name: N/A  . Number of children: N/A  . Years of education: N/A   Social History Main Topics  . Smoking status: Current Every Day Smoker    Packs/day: 1.00    Types: Cigarettes  . Smokeless tobacco: Never  Used  . Alcohol use 2.4 oz/week    4 Standard drinks or equivalent per week  . Drug use: No  . Sexual activity: Yes    Birth control/ protection: Pill   Other Topics Concern  . None   Social History Narrative  . None    Family History: History reviewed. No pertinent family history.  Allergies: No Known Allergies      PHYSICAL EXAM: Pelvic - normal external genitalia, vulva, vagina, cervix, uterus and adnexa, CERVIX: cervical discharge present - white, copious and malodorous, WET MOUNT done - results: negative for pathogens, normal epithelial cells, clue cells   Labs: No results found for this or any previous visit (from the past 24 hour(s)).   Assessment: Patient Active Problem List   Diagnosis Date Noted  . ADHD (attention deficit hyperactivity disorder) 04/16/2016  . Obesity (BMI 30-39.9) 04/16/2016  BV  Plan:  No orders of the defined types were placed in this encounter.  Flagyl 583m po bid 5-7 days with refills for prn use Restarted B12 and adipex- rx sent in, will have co-worker given B12 and will update me monthly on MyChart.  RTC in 3 months for med check.  Sheily Lineman [employer]

## 2016-06-15 NOTE — Patient Instructions (Addendum)
Thank you for enrolling in [facility]. Please follow the instructions below to securely access your online medical record. MyChart allows you to send messages to your doctor, view your test results, renew your prescriptions, schedule appointments, and more.  How Do I Sign Up? 1. In your Internet browser, go to [email or web address]. 2. Click on the New  User? link in the Sign In box.  3. Enter your MyChart Access Code exactly as it appears below. You will not need to use this code after you have completed the sign-up process. If you do not sign up before the expiration date, you must request a new code. MyChart Access Code: [email or web address] Expires: 06/15/2016  3:20 PM  4. Enter the last four digits of your Social Security Number (xxxx) and Date of Birth (mm/dd/yyyy) as indicated and click Next. You will be taken to the next sign-up page. 5. Create a MyChart ID. This will be your MyChart login ID and cannot be changed, so think of one that is secure and easy to remember. 6. Create a MyChart password. You can change your password at any time. 7. Enter your Password Reset Question and Answer and click Next. This can be used at a later time if you forget your password.  8. Select your communication preference, and if applicable enter your e-mail address. You will receive e-mail notification when new information is available in MyChart by choosing to receive e-mail notifications and filling in your e-mail. 9. Click Sign In. You can now view your medical record.   Additional Information If you have questions, you can email [email or web address] or call [phone] to talk to our [place] staff. Remember, MyChart is NOT to be used for urgent needs. For medical emergencies, dial 911.   Bacterial Vaginosis Bacterial vaginosis is a vaginal infection that occurs when the normal balance of bacteria in the vagina is disrupted. It results from an overgrowth of certain bacteria. This  is the most common vaginal infection in women of childbearing age. Treatment is important to prevent complications, especially in pregnant women, as it can cause a premature delivery. CAUSES  Bacterial vaginosis is caused by an increase in harmful bacteria that are normally present in smaller amounts in the vagina. Several different kinds of bacteria can cause bacterial vaginosis. However, the reason that the condition develops is not fully understood. RISK FACTORS Certain activities or behaviors can put you at an increased risk of developing bacterial vaginosis, including:  Having a new sex partner or multiple sex partners.  Douching.  Using an intrauterine device (IUD) for contraception. Women do not get bacterial vaginosis from toilet seats, bedding, swimming pools, or contact with objects around them. SIGNS AND SYMPTOMS  Some women with bacterial vaginosis have no signs or symptoms. Common symptoms include:  Grey vaginal discharge.  A fishlike odor with discharge, especially after sexual intercourse.  Itching or burning of the vagina and vulva.  Burning or pain with urination. DIAGNOSIS  Your health care provider will take a medical history and examine the vagina for signs of bacterial vaginosis. A sample of vaginal fluid may be taken. Your health care provider will look at this sample under a microscope to check for bacteria and abnormal cells. A vaginal pH test may also be done.  TREATMENT  Bacterial vaginosis may be treated with antibiotic medicines. These may be given in the form of a pill or a vaginal cream. A second round of antibiotics may be prescribed if the condition comes back  after treatment. Because bacterial vaginosis increases your risk for sexually transmitted diseases, getting treated can help reduce your risk for chlamydia, gonorrhea, HIV, and herpes. HOME CARE INSTRUCTIONS   Only take over-the-counter or prescription medicines as directed by your health care  provider.  If antibiotic medicine was prescribed, take it as directed. Make sure you finish it even if you start to feel better.  Tell all sexual partners that you have a vaginal infection. They should see their health care provider and be treated if they have problems, such as a mild rash or itching.  During treatment, it is important that you follow these instructions:  Avoid sexual activity or use condoms correctly.  Do not douche.  Avoid alcohol as directed by your health care provider.  Avoid breastfeeding as directed by your health care provider. SEEK MEDICAL CARE IF:   Your symptoms are not improving after 3 days of treatment.  You have increased discharge or pain.  You have a fever. MAKE SURE YOU:   Understand these instructions.  Will watch your condition.  Will get help right away if you are not doing well or get worse. FOR MORE INFORMATION  Centers for Disease Control and Prevention, Division of STD Prevention: AppraiserFraud.fi American Sexual Health Association (ASHA): www.ashastd.org    This information is not intended to replace advice given to you by your health care provider. Make sure you discuss any questions you have with your health care provider.   Document Released: 11/08/2005 Document Revised: 11/29/2014 Document Reviewed: 06/20/2013 Elsevier Interactive Patient Education 2016 [place] you for enrolling in [place]. Please follow the instructions below to securely access your online medical record. MyChart allows you to send messages to your doctor, view your test results, manage appointments, and more.   How Do I Sign Up? 10. In your Internet browser, go to [employer] and enter [email or web address]. 11. Click on the Sign Up Now link in the Sign In box. You will see the New Member Sign Up page. 43. Enter your MyChart Access Code exactly as it appears below. You will not need to use this code after you've completed the  sign-up process. If you do not sign up before the expiration date, you must request a new code.  MyChart Access Code: [email or web address] Expires: 08/14/2016  9:06 AM  13. Enter your Social Security Number ([id number]) and Date of Birth (mm/dd/yyyy) as indicated and click Submit. You will be taken to the next sign-up page. 14. Create a MyChart ID. This will be your MyChart login ID and cannot be changed, so think of one that is secure and easy to remember. 9. Create a MyChart password. You can change your password at any time. 22. Enter your Password Reset Question and Answer. This can be used at a later time if you forget your password.  41. Enter your e-mail address. You will receive e-mail notification when new information is available in [place]. 18. Click Sign Up. You can now view your medical record.   Additional Information Remember, MyChart is NOT to be used for urgent needs. For medical emergencies, dial 911.

## 2016-06-22 ENCOUNTER — Telehealth: Payer: Self-pay | Admitting: Obstetrics and Gynecology

## 2016-06-22 ENCOUNTER — Other Ambulatory Visit: Payer: Self-pay | Admitting: Obstetrics and Gynecology

## 2016-06-22 MED ORDER — AMPHETAMINE-DEXTROAMPHETAMINE 20 MG PO TABS: 20.0000 mg | ORAL_TABLET | Freq: Two times a day (BID) | ORAL | 0 refills | Status: DC

## 2016-06-22 NOTE — Telephone Encounter (Signed)
Please let her know it is done

## 2016-06-22 NOTE — Telephone Encounter (Signed)
Patient called requesting a refill on adderral. Thanks

## 2016-06-22 NOTE — Telephone Encounter (Signed)
See request °

## 2016-06-22 NOTE — Telephone Encounter (Signed)
Notified pt rx ready

## 2016-07-19 ENCOUNTER — Other Ambulatory Visit: Payer: Self-pay | Admitting: Obstetrics and Gynecology

## 2016-07-19 NOTE — Telephone Encounter (Signed)
See refill request.

## 2016-09-06 ENCOUNTER — Telehealth: Payer: Self-pay | Admitting: Obstetrics and Gynecology

## 2016-09-06 NOTE — Telephone Encounter (Signed)
Patient called requesting a refill on her adderral. She would like a call once its ready to be picked up. Thanks

## 2016-09-06 NOTE — Telephone Encounter (Signed)
See message.

## 2016-09-07 ENCOUNTER — Other Ambulatory Visit: Payer: Self-pay | Admitting: Obstetrics and Gynecology

## 2016-09-07 MED ORDER — AMPHETAMINE-DEXTROAMPHETAMINE 20 MG PO TABS: 20.0000 mg | ORAL_TABLET | Freq: Two times a day (BID) | ORAL | 0 refills | Status: DC

## 2016-09-08 ENCOUNTER — Other Ambulatory Visit: Payer: Self-pay | Admitting: *Deleted

## 2016-09-08 ENCOUNTER — Telehealth: Payer: Self-pay | Admitting: Obstetrics and Gynecology

## 2016-09-08 MED ORDER — ACYCLOVIR 5 % EX OINT: 1.0000 "application " | TOPICAL_OINTMENT | CUTANEOUS | 4 refills | Status: DC

## 2016-09-08 NOTE — Telephone Encounter (Signed)
Done-ac 

## 2016-09-08 NOTE — Telephone Encounter (Signed)
Pt needs alvacor ointment  Sent to pharmacy. SHE WALKED IN to pick up the other med.

## 2016-09-20 ENCOUNTER — Other Ambulatory Visit: Payer: Self-pay | Admitting: Obstetrics and Gynecology

## 2016-11-02 ENCOUNTER — Other Ambulatory Visit: Payer: Self-pay

## 2016-11-02 MED ORDER — ACYCLOVIR 800 MG PO TABS: 800.0000 mg | ORAL_TABLET | Freq: Two times a day (BID) | ORAL | 3 refills | Status: DC

## 2016-11-06 ENCOUNTER — Emergency Department: Payer: BLUE CROSS/BLUE SHIELD

## 2016-11-06 ENCOUNTER — Encounter
Admission: EM | Disposition: A | Discharge status: Home / Self Care | Payer: Self-pay | Source: Home / Self Care | Attending: Internal Medicine

## 2016-11-06 ENCOUNTER — Encounter: Payer: Self-pay | Admitting: Internal Medicine

## 2016-11-06 ENCOUNTER — Observation Stay: Payer: BLUE CROSS/BLUE SHIELD | Admitting: Anesthesiology

## 2016-11-06 ENCOUNTER — Inpatient Hospital Stay
Admission: EM | Admit: 2016-11-06 | Discharge: 2016-11-09 | DRG: 872 | Disposition: A | Discharge status: Home / Self Care | Payer: BLUE CROSS/BLUE SHIELD | Attending: Internal Medicine | Admitting: Internal Medicine

## 2016-11-06 DIAGNOSIS — K5190 Ulcerative colitis, unspecified, without complications: Secondary | ICD-10-CM | POA: Diagnosis present

## 2016-11-06 DIAGNOSIS — N1330 Unspecified hydronephrosis: Secondary | ICD-10-CM

## 2016-11-06 DIAGNOSIS — N132 Hydronephrosis with renal and ureteral calculous obstruction: Secondary | ICD-10-CM | POA: Diagnosis present

## 2016-11-06 DIAGNOSIS — Z23 Encounter for immunization: Secondary | ICD-10-CM

## 2016-11-06 DIAGNOSIS — N3289 Other specified disorders of bladder: Secondary | ICD-10-CM | POA: Diagnosis present

## 2016-11-06 DIAGNOSIS — Z79891 Long term (current) use of opiate analgesic: Secondary | ICD-10-CM

## 2016-11-06 DIAGNOSIS — E876 Hypokalemia: Secondary | ICD-10-CM | POA: Diagnosis present

## 2016-11-06 DIAGNOSIS — A419 Sepsis, unspecified organism: Secondary | ICD-10-CM | POA: Diagnosis present

## 2016-11-06 DIAGNOSIS — Z841 Family history of disorders of kidney and ureter: Secondary | ICD-10-CM

## 2016-11-06 DIAGNOSIS — N3001 Acute cystitis with hematuria: Secondary | ICD-10-CM

## 2016-11-06 DIAGNOSIS — R109 Unspecified abdominal pain: Secondary | ICD-10-CM

## 2016-11-06 DIAGNOSIS — E871 Hypo-osmolality and hyponatremia: Secondary | ICD-10-CM | POA: Diagnosis present

## 2016-11-06 DIAGNOSIS — N201 Calculus of ureter: Secondary | ICD-10-CM

## 2016-11-06 DIAGNOSIS — F17210 Nicotine dependence, cigarettes, uncomplicated: Secondary | ICD-10-CM | POA: Diagnosis present

## 2016-11-06 DIAGNOSIS — Z7289 Other problems related to lifestyle: Secondary | ICD-10-CM

## 2016-11-06 DIAGNOSIS — E282 Polycystic ovarian syndrome: Secondary | ICD-10-CM | POA: Diagnosis present

## 2016-11-06 DIAGNOSIS — Z793 Long term (current) use of hormonal contraceptives: Secondary | ICD-10-CM

## 2016-11-06 DIAGNOSIS — Z79899 Other long term (current) drug therapy: Secondary | ICD-10-CM

## 2016-11-06 DIAGNOSIS — F909 Attention-deficit hyperactivity disorder, unspecified type: Secondary | ICD-10-CM | POA: Diagnosis present

## 2016-11-06 HISTORY — PX: CYSTOSCOPY WITH STENT PLACEMENT: SHX5790

## 2016-11-06 HISTORY — DX: Unspecified hydronephrosis: N13.30

## 2016-11-06 LAB — URINALYSIS, COMPLETE (UACMP) WITH MICROSCOPIC
Bilirubin Urine: NEGATIVE
Glucose, UA: NEGATIVE mg/dL
Ketones, ur: 20 mg/dL — AB
Nitrite: NEGATIVE
Protein, ur: 100 mg/dL — AB
Specific Gravity, Urine: 1.014 (ref 1.005–1.030)
pH: 6 (ref 5.0–8.0)

## 2016-11-06 LAB — CBC
HCT: 42.2 % (ref 35.0–47.0)
Hemoglobin: 14.9 g/dL (ref 12.0–16.0)
MCH: 32.3 pg (ref 26.0–34.0)
MCHC: 35.3 g/dL (ref 32.0–36.0)
MCV: 91.3 fL (ref 80.0–100.0)
Platelets: 193 10*3/uL (ref 150–440)
RBC: 4.62 MIL/uL (ref 3.80–5.20)
RDW: 12.6 % (ref 11.5–14.5)
WBC: 9.6 10*3/uL (ref 3.6–11.0)

## 2016-11-06 LAB — COMPREHENSIVE METABOLIC PANEL
ALT: 25 U/L (ref 14–54)
AST: 26 U/L (ref 15–41)
Albumin: 3.7 g/dL (ref 3.5–5.0)
Alkaline Phosphatase: 43 U/L (ref 38–126)
Anion gap: 11 (ref 5–15)
BUN: 8 mg/dL (ref 6–20)
CO2: 21 mmol/L — ABNORMAL LOW (ref 22–32)
Calcium: 9.1 mg/dL (ref 8.9–10.3)
Chloride: 99 mmol/L — ABNORMAL LOW (ref 101–111)
Creatinine, Ser: 0.88 mg/dL (ref 0.44–1.00)
GFR calc Af Amer: >60 mL/min (ref >60)
GFR calc non Af Amer: >60 mL/min (ref >60)
Glucose, Bld: 103 mg/dL — ABNORMAL HIGH (ref 65–99)
Potassium: 3.4 mmol/L — ABNORMAL LOW (ref 3.5–5.1)
Sodium: 131 mmol/L — ABNORMAL LOW (ref 135–145)
Total Bilirubin: 0.7 mg/dL (ref 0.3–1.2)
Total Protein: 7.2 g/dL (ref 6.5–8.1)

## 2016-11-06 LAB — POC URINE PREG, ED: Preg Test, Ur: NEGATIVE

## 2016-11-06 SURGERY — CYSTOSCOPY, WITH STENT INSERTION
Anesthesia: General | Laterality: Left | Wound class: Dirty or Infected

## 2016-11-06 MED ORDER — DEXTROSE 5 % IV SOLN: 1.0000 g | INTRAVENOUS | Status: DC

## 2016-11-06 MED ORDER — ONDANSETRON HCL 4 MG PO TABS: 4.0000 mg | ORAL_TABLET | Freq: Four times a day (QID) | ORAL | Status: DC | PRN

## 2016-11-06 MED ORDER — OXYCODONE HCL 5 MG PO TABS: 5.0000 mg | ORAL_TABLET | Freq: Once | ORAL | Status: DC | PRN

## 2016-11-06 MED ORDER — SODIUM CHLORIDE 0.9 % IV BOLUS (SEPSIS)
1000.0000 mL | Freq: Once | INTRAVENOUS | Status: AC
  Administered 2016-11-06: 1000 mL via INTRAVENOUS

## 2016-11-06 MED ORDER — SODIUM CHLORIDE 0.9 % IV SOLN
1000.0000 mL | Freq: Once | INTRAVENOUS | Status: AC
  Administered 2016-11-06: 1000 mL via INTRAVENOUS

## 2016-11-06 MED ORDER — MORPHINE SULFATE (PF) 4 MG/ML IV SOLN
4.0000 mg | Freq: Once | INTRAVENOUS | Status: AC
  Administered 2016-11-06: 4 mg via INTRAVENOUS
  Filled 2016-11-06: qty 1

## 2016-11-06 MED ORDER — PROPOFOL 10 MG/ML IV BOLUS
INTRAVENOUS | Status: DC | PRN
  Administered 2016-11-06: 200 mg via INTRAVENOUS

## 2016-11-06 MED ORDER — ONDANSETRON HCL 4 MG/2ML IJ SOLN: 4.0000 mg | Freq: Four times a day (QID) | INTRAMUSCULAR | Status: DC | PRN

## 2016-11-06 MED ORDER — ONDANSETRON HCL 4 MG/2ML IJ SOLN
4.0000 mg | Freq: Once | INTRAMUSCULAR | Status: AC
  Administered 2016-11-06: 4 mg via INTRAVENOUS
  Filled 2016-11-06: qty 2

## 2016-11-06 MED ORDER — ALBUTEROL SULFATE (2.5 MG/3ML) 0.083% IN NEBU: 2.5000 mg | INHALATION_SOLUTION | RESPIRATORY_TRACT | Status: DC | PRN

## 2016-11-06 MED ORDER — QUETIAPINE FUMARATE 200 MG PO TABS
200.0000 mg | ORAL_TABLET | Freq: Every day | ORAL | Status: DC
  Administered 2016-11-06 – 2016-11-08 (×3): 200 mg via ORAL
  Filled 2016-11-06: qty 8
  Filled 2016-11-06: qty 1
  Filled 2016-11-06 (×3): qty 8
  Filled 2016-11-06 (×2): qty 1

## 2016-11-06 MED ORDER — GENTAMICIN SULFATE 40 MG/ML IJ SOLN
120.0000 mg | INTRAVENOUS | Status: AC
  Administered 2016-11-06: 120 mg via INTRAVENOUS

## 2016-11-06 MED ORDER — FENTANYL CITRATE (PF) 100 MCG/2ML IJ SOLN
INTRAMUSCULAR | Status: AC
  Administered 2016-11-06: 25 ug via INTRAVENOUS
  Filled 2016-11-06: qty 2

## 2016-11-06 MED ORDER — ACETAMINOPHEN 325 MG PO TABS
650.0000 mg | ORAL_TABLET | Freq: Four times a day (QID) | ORAL | Status: DC | PRN
  Administered 2016-11-07 (×2): 650 mg via ORAL
  Filled 2016-11-06 (×2): qty 2

## 2016-11-06 MED ORDER — MESALAMINE 400 MG PO CPDR
800.0000 mg | DELAYED_RELEASE_CAPSULE | Freq: Three times a day (TID) | ORAL | Status: DC
  Administered 2016-11-06 – 2016-11-09 (×9): 800 mg via ORAL
  Filled 2016-11-06 (×11): qty 2

## 2016-11-06 MED ORDER — KETOROLAC TROMETHAMINE 30 MG/ML IJ SOLN
30.0000 mg | Freq: Four times a day (QID) | INTRAMUSCULAR | Status: DC | PRN
  Administered 2016-11-06 – 2016-11-09 (×9): 30 mg via INTRAVENOUS
  Filled 2016-11-06 (×10): qty 1

## 2016-11-06 MED ORDER — LACTATED RINGERS IV SOLN
INTRAVENOUS | Status: DC | PRN
  Administered 2016-11-06: 15:00:00 via INTRAVENOUS

## 2016-11-06 MED ORDER — CEFTRIAXONE SODIUM-DEXTROSE 1-3.74 GM-% IV SOLR
1.0000 g | Freq: Once | INTRAVENOUS | Status: AC
  Administered 2016-11-06: 1 g via INTRAVENOUS
  Filled 2016-11-06: qty 50

## 2016-11-06 MED ORDER — NORGESTREL-ETHINYL ESTRADIOL 0.3-30 MG-MCG PO TABS: 1.0000 | ORAL_TABLET | Freq: Every day | ORAL | Status: DC

## 2016-11-06 MED ORDER — HEPARIN SODIUM (PORCINE) 5000 UNIT/ML IJ SOLN
5000.0000 [IU] | Freq: Three times a day (TID) | INTRAMUSCULAR | Status: DC
  Administered 2016-11-06 – 2016-11-09 (×8): 5000 [IU] via SUBCUTANEOUS
  Filled 2016-11-06 (×8): qty 1

## 2016-11-06 MED ORDER — OXYCODONE HCL 5 MG/5ML PO SOLN: 5.0000 mg | Freq: Once | ORAL | Status: DC | PRN

## 2016-11-06 MED ORDER — INFLUENZA VAC SPLIT QUAD 0.5 ML IM SUSY
0.5000 mL | PREFILLED_SYRINGE | INTRAMUSCULAR | Status: AC
  Administered 2016-11-09: 0.5 mL via INTRAMUSCULAR
  Filled 2016-11-06: qty 0.5

## 2016-11-06 MED ORDER — DEXTROSE 5 % IV SOLN: 1.0000 g | Freq: Once | INTRAVENOUS | Status: DC

## 2016-11-06 MED ORDER — ACETAMINOPHEN 650 MG RE SUPP: 650.0000 mg | Freq: Four times a day (QID) | RECTAL | Status: DC | PRN

## 2016-11-06 MED ORDER — AMPHETAMINE-DEXTROAMPHETAMINE 5 MG PO TABS
20.0000 mg | ORAL_TABLET | Freq: Two times a day (BID) | ORAL | Status: DC
  Administered 2016-11-07: 20 mg via ORAL
  Filled 2016-11-06 (×4): qty 4

## 2016-11-06 MED ORDER — TRAMADOL HCL 50 MG PO TABS
50.0000 mg | ORAL_TABLET | Freq: Four times a day (QID) | ORAL | Status: DC | PRN
  Administered 2016-11-06 – 2016-11-07 (×3): 50 mg via ORAL
  Filled 2016-11-06 (×4): qty 1

## 2016-11-06 MED ORDER — PNEUMOCOCCAL VAC POLYVALENT 25 MCG/0.5ML IJ INJ
0.5000 mL | INJECTION | INTRAMUSCULAR | Status: AC
  Administered 2016-11-09: 0.5 mL via INTRAMUSCULAR
  Filled 2016-11-06: qty 0.5

## 2016-11-06 MED ORDER — SODIUM CHLORIDE 0.9 % IV SOLN: INTRAVENOUS | Status: DC

## 2016-11-06 MED ORDER — ESCITALOPRAM OXALATE 10 MG PO TABS
20.0000 mg | ORAL_TABLET | Freq: Every day | ORAL | Status: DC
  Administered 2016-11-06 – 2016-11-09 (×4): 20 mg via ORAL
  Filled 2016-11-06 (×5): qty 2

## 2016-11-06 MED ORDER — POTASSIUM CHLORIDE IN NACL 20-0.9 MEQ/L-% IV SOLN
INTRAVENOUS | Status: DC
  Administered 2016-11-06: 17:00:00 via INTRAVENOUS
  Administered 2016-11-07: 1000 mL via INTRAVENOUS
  Administered 2016-11-07: 02:00:00 via INTRAVENOUS
  Administered 2016-11-07: 1000 mL via INTRAVENOUS
  Administered 2016-11-08: 01:00:00 via INTRAVENOUS
  Filled 2016-11-06 (×6): qty 1000

## 2016-11-06 MED ORDER — BELLADONNA ALKALOIDS-OPIUM 16.2-60 MG RE SUPP
1.0000 | Freq: Four times a day (QID) | RECTAL | Status: DC | PRN
  Administered 2016-11-06 – 2016-11-07 (×3): 1 via RECTAL
  Filled 2016-11-06 (×3): qty 1

## 2016-11-06 MED ORDER — MIRABEGRON ER 25 MG PO TB24
25.0000 mg | ORAL_TABLET | Freq: Every day | ORAL | Status: DC
  Administered 2016-11-06 – 2016-11-09 (×4): 25 mg via ORAL
  Filled 2016-11-06 (×4): qty 1

## 2016-11-06 MED ORDER — ACYCLOVIR 200 MG PO CAPS
800.0000 mg | ORAL_CAPSULE | Freq: Two times a day (BID) | ORAL | Status: DC
  Administered 2016-11-06 – 2016-11-09 (×6): 800 mg via ORAL
  Filled 2016-11-06 (×6): qty 4

## 2016-11-06 MED ORDER — FENTANYL CITRATE (PF) 100 MCG/2ML IJ SOLN
25.0000 ug | INTRAMUSCULAR | Status: DC | PRN
  Administered 2016-11-06 (×4): 25 ug via INTRAVENOUS

## 2016-11-06 MED ORDER — CEFTRIAXONE SODIUM-DEXTROSE 1-3.74 GM-% IV SOLR
1.0000 g | INTRAVENOUS | Status: DC
  Filled 2016-11-06: qty 50

## 2016-11-06 MED ORDER — IOTHALAMATE MEGLUMINE 17.2 % UR SOLN
URETHRAL | Status: DC | PRN
  Administered 2016-11-06: 50 mL via URETHRAL

## 2016-11-06 MED ORDER — NICOTINE 14 MG/24HR TD PT24
14.0000 mg | MEDICATED_PATCH | Freq: Every day | TRANSDERMAL | Status: DC
  Administered 2016-11-07 – 2016-11-09 (×3): 14 mg via TRANSDERMAL
  Filled 2016-11-06 (×4): qty 1

## 2016-11-06 MED ORDER — ACYCLOVIR 800 MG PO TABS
800.0000 mg | ORAL_TABLET | Freq: Two times a day (BID) | ORAL | Status: DC
  Filled 2016-11-06: qty 1

## 2016-11-06 SURGICAL SUPPLY — 17 items
BACTOSHIELD CHG 4% 4OZ (MISCELLANEOUS) ×1
GLOVE BIO SURGEON STRL SZ7.5 (GLOVE) ×2 IMPLANT
GOWN STRL REUS W/ TWL LRG LVL4 (GOWN DISPOSABLE) ×1 IMPLANT
GOWN STRL REUS W/TWL LRG LVL4 (GOWN DISPOSABLE) ×1
GOWN STRL REUS W/TWL XL LVL3 (GOWN DISPOSABLE) ×2 IMPLANT
KIT RM TURNOVER CYSTO AR (KITS) ×2 IMPLANT
PACK CYSTO AR (MISCELLANEOUS) ×2 IMPLANT
SCRUB CHG 4% DYNA-HEX 4OZ (MISCELLANEOUS) ×1 IMPLANT
SENSORWIRE 0.038 NOT ANGLED (WIRE) ×2
SET CYSTO W/LG BORE CLAMP LF (SET/KITS/TRAYS/PACK) ×2 IMPLANT
SOL .9 NS 3000ML IRR  AL (IV SOLUTION) ×1
SOL .9 NS 3000ML IRR UROMATIC (IV SOLUTION) ×1 IMPLANT
STENT URET 6FRX24 CONTOUR (STENTS) IMPLANT
STENT URET 6FRX26 CONTOUR (STENTS) ×2 IMPLANT
SURGILUBE 2OZ TUBE FLIPTOP (MISCELLANEOUS) ×2 IMPLANT
WATER STERILE IRR 1000ML POUR (IV SOLUTION) ×2 IMPLANT
WIRE SENSOR 0.038 NOT ANGLED (WIRE) ×1 IMPLANT

## 2016-11-06 NOTE — ED Notes (Signed)
This RN to bedside, received informed consent and placed on patient's chart. Pt SO at bedside at this time, pt instructed to remove all clothes and personal belongings, pt states understanding at this time. Will continue to monitor for further patient needs.

## 2016-11-06 NOTE — ED Provider Notes (Signed)
[facility] Emergency Department Provider Note   ____________________________________________    I have reviewed the triage vital signs and the nursing notes.   HISTORY  Chief Complaint Flank Pain     HPI Tracy Whitaker is a 35 y.o. female who presents with complaints of moderate left flank pain which she describes as sharp. She reports this developed proximal to 5 days ago. Yesterday evening she developed dysuria. She denies fevers or chills. No nausea or vomiting. At first she thought it may be her ulcerative colitis acting up but this does not feel the same. She denies a history of kidney stones.   Past Medical History:  Diagnosis Date  . ADHD (attention deficit hyperactivity disorder)   . Polycystic disease, ovaries   . Ulcerative colitis [facility])     Patient Active Problem List   Diagnosis Date Noted  . Hydronephrosis, left 11/06/2016  . ADHD (attention deficit hyperactivity disorder) 04/16/2016  . Obesity (BMI 30-39.9) 04/16/2016    No past surgical history on file.  Prior to Admission medications   Medication Sig Start Date End Date Taking? Authorizing Provider  escitalopram (LEXAPRO) 20 MG tablet TAKE 1 TABLET (20 MG TOTAL) BY MOUTH DAILY. 09/20/16  Yes Melody N Shambley, CNM  norgestrel-ethinyl estradiol (LO/OVRAL,CRYSELLE) 0.3-30 MG-MCG tablet Take 1 tablet by mouth daily. 10/31/15  Yes Melody N Shambley, CNM  QUEtiapine (SEROQUEL) 200 MG tablet TAKE 1 TABLET (200 MG TOTAL) BY MOUTH AT BEDTIME. 07/20/16  Yes Melody N Shambley, CNM  acyclovir (ZOVIRAX) 800 MG tablet Take 1 tablet (800 mg total) by mouth 2 (two) times daily. 11/02/16   Melody N Shambley, CNM  acyclovir ointment (ZOVIRAX) 5 % Apply 1 application topically every 3 (three) hours. 09/08/16   Melody N Shambley, CNM  amphetamine-dextroamphetamine (ADDERALL) 20 MG tablet Take 1 tablet (20 mg total) by mouth 2 (two) times daily. 09/07/16   Melody N Shambley, CNM  buPROPion  (WELLBUTRIN XL) 300 MG 24 hr tablet Take 1 tablet (300 mg total) by mouth daily. 02/10/16   Melody N Shambley, CNM  cyanocobalamin (,VITAMIN B-12,) 1000 MCG/ML injection Inject 1 mL (1,000 mcg total) into the muscle every 30 (thirty) days. 06/15/16   Melody N Shambley, CNM  DELZICOL 400 MG CPDR DR capsule Take 800 mg by mouth 3 (three) times daily. 10/05/16   Historical Provider, MD  HYDROcodone-acetaminophen (NORCO/VICODIN) 5-325 MG tablet Take 1 tablet by mouth every 6 (six) hours as needed. Patient not taking: Reported on 04/16/2016 10/31/15   Melody N Shambley, CNM  metroNIDAZOLE (FLAGYL) 500 MG tablet Take 1 tablet (500 mg total) by mouth 2 (two) times daily. 06/15/16   Melody N Shambley, CNM  phentermine (ADIPEX-P) 37.5 MG tablet Take 1 tablet (37.5 mg total) by mouth daily before breakfast. 06/15/16   Melody Rockney Ghee, CNM     Allergies Patient has no known allergies.  No family history on file.  Social History Social History  Substance Use Topics  . Smoking status: Current Every Day Smoker    Packs/day: 1.00    Types: Cigarettes  . Smokeless tobacco: Never Used  . Alcohol use 2.4 oz/week    4 Standard drinks or equivalent per week    Review of Systems  Constitutional: No fever/chills Eyes: No visual changes.  ENT: No sore throat. Cardiovascular: Denies chest pain. Respiratory: Denies shortness of breath. Gastrointestinal:As above  Genitourinary: As above Musculoskeletal: Negative for back pain. Skin: Negative for rash. Neurological: Negative for headaches  10-point ROS  otherwise negative.  ____________________________________________   PHYSICAL EXAM:  VITAL SIGNS: ED Triage Vitals  Enc Vitals Group     BP 11/06/16 1005 100/80     Pulse Rate 11/06/16 1004 95     Resp 11/06/16 1004 18     Temp 11/06/16 1004 99.2 F (37.3 C)     Temp Source 11/06/16 1004 Oral     SpO2 11/06/16 1004 97 %     Weight --      Height --      Head Circumference --      Peak Flow --       Pain Score 11/06/16 1004 10     Pain Loc --      Pain Edu? --      Excl. in [place]? --     Constitutional: Alert and oriented. Pleasant and interactive Eyes: Conjunctivae are normal.  Head: Atraumatic. Nose: No congestion/rhinnorhea. Mouth/Throat: Mucous membranes are moist.    Cardiovascular: Normal rate, regular rhythm. Grossly normal heart sounds.  Good peripheral circulation. Respiratory: Normal respiratory effort.  No retractions. Lungs CTAB. Gastrointestinal: Soft and nontender. No distention.  No CVA tenderness. Genitourinary: deferred Musculoskeletal: No lower extremity tenderness nor edema.  Warm and well perfused Neurologic:  Normal speech and language. No gross focal neurologic deficits are appreciated.  Skin:  Skin is warm, dry and intact. No rash noted. Psychiatric: Mood and affect are normal. Speech and behavior are normal.  ____________________________________________   LABS (all labs ordered are listed, but only abnormal results are displayed)  Labs Reviewed  URINALYSIS, COMPLETE (UACMP) WITH MICROSCOPIC - Abnormal; Notable for the following:       Result Value   Color, Urine GREEN (*)    APPearance CLEAR (*)    Hgb urine dipstick SMALL (*)    Ketones, ur 20 (*)    Protein, ur 100 (*)    Leukocytes, UA MODERATE (*)    Bacteria, UA MANY (*)    Squamous Epithelial / LPF 0-5 (*)    All other components within normal limits  COMPREHENSIVE METABOLIC PANEL - Abnormal; Notable for the following:    Sodium 131 (*)    Potassium 3.4 (*)    Chloride 99 (*)    CO2 21 (*)    Glucose, Bld 103 (*)    All other components within normal limits  CBC  POC URINE PREG, ED  POC URINE PREG, ED   ____________________________________________  EKG  None ____________________________________________  RADIOLOGY   Ct renal stone study shows 4 mm left ureteral stone ____________________________________________   PROCEDURES  Procedure(s) performed: No    Critical  Care performed: No ____________________________________________   INITIAL IMPRESSION / ASSESSMENT AND PLAN / ED COURSE  Pertinent labs & imaging results that were available during my care of the patient were reviewed by me and considered in my medical decision making (see chart for details).  Patient overall well-appearing, nontoxic, left leg pain but no CVA tenderness. Lab work is overall reassuring however consistent with a urinary tract infection. We'll obtain CT renal stone study to rule out kidney stone and give a dose of IV Rocephin in the ED.  ----------------------------------------- 1:36 PM on 11/06/2016 -----------------------------------------  CT scan demonstrates kidney stone. Given the significant urinary tract infection borderline blood pressure and mildly elevated temperature Discussed with Dr. Pilar Jarvis of urology who will stent the patient and asks for me to admit to the hospitalist service. Discussed with Dr. Bridgett Larsson who will accept the patient to the hospitalist service  Clinical Course    ____________________________________________   FINAL CLINICAL IMPRESSION(S) / ED DIAGNOSES  Final diagnoses:  Flank pain, acute  Ureterolithiasis  Acute cystitis with hematuria      NEW MEDICATIONS STARTED DURING THIS VISIT:  New Prescriptions   No medications on file     Note:  This document was prepared using Dragon voice recognition software and may include unintentional dictation errors.    Lavonia Drafts, MD 11/06/16 [phone]

## 2016-11-06 NOTE — ED Notes (Signed)
Report given to Jamie, RN.

## 2016-11-06 NOTE — Op Note (Signed)
Date of procedure: 11/06/16  Preoperative diagnosis:  1. Left ureteral stone 2. Probable UTI   Postoperative diagnosis:  1. Same   Procedure: 1. Cystoscopy 2. Left retrograde pyelograms with interpretation 3. Left ureteral stent placement 6 French by 26 cm  Surgeon: Baruch Gouty, MD  Anesthesia: General  Complications: None  Intraoperative findings: The patient had a hydronephrotic drip upon advancing an open a catheter past the stone. Left retrograde powder was used to identify the renal pelvis. Left ureteral stent was properly placed.  EBL: None  Specimens: Urine culture from left ureter  Drains: 6 French by 26 cm left double-J ureteral stent, 16 Fr foley catheter  Disposition: Stable to the postanesthesia care unit  Indication for procedure: The patient is a 35 y.o. female with a 4 mm left UVJ stone with concern for pending sepsis and presents today for urgent left ureteral stent .  After reviewing the management options for treatment, the patient elected to proceed with the above surgical procedure(s). We have discussed the potential benefits and risks of the procedure, side effects of the proposed treatment, the likelihood of the patient achieving the goals of the procedure, and any potential problems that might occur during the procedure or recuperation. Informed consent has been obtained.  Description of procedure: The patient was met in the preoperative area. All risks, benefits, and indications of the procedure were described in great detail. The patient consented to the procedure. Preoperative antibiotics were given. The patient was taken to the operative theater. General anesthesia was induced per the anesthesia service. The patient was then placed in the dorsal lithotomy position and prepped and draped in the usual sterile fashion. A preoperative timeout was called.   A 21 French 30 cystoscope was inserted into the patient's bladder per urethra atraumatically. The  left ureteral orifice was identified and a sensor wire and advanced past the distal stone. An open a catheter was advanced over the sensor wire proximal to the stone. Sensor wire was removed and a hydronephrotic drip was allowed to drain and this urine was sent for culture. After the hydronephrotic drip and resolved a little showed a left retrograde pyelogram was obtained to identify the collecting system. A sensor wire was advanced the renal pelvis and the open-ended catheter removed. A 6 French by 26 cm double-J ureteral stent was advanced over the sensor wire and sensor wire was removed. A curl was seen in the patient's renal pelvis on fluoroscopy and the urinary bladder under direct visualization. There is drainage of cloudy urine at this point after the stent was placed. The cystoscope was removed and a 16 French Foley catheter was placed. The patient was woken from anesthesia and transferred in stable condition to the post anesthesia care unit.  Plan: The patient will be admitted to floor for IV antibiotics. She'll continue broad-spectrum antibiotics pending culture results. She'll need definitive outpatient management of her left ureteral stone once her infection has cleared as an outpatient.   Baruch Gouty, M.D.

## 2016-11-06 NOTE — Transfer of Care (Signed)
Immediate Anesthesia Transfer of Care Note  Patient: Tracy Whitaker  Procedure(s) Performed: Procedure(s): CYSTOSCOPY WITH STENT PLACEMENT (Left)  Patient Location: PACU  Anesthesia Type:General  Level of Consciousness: awake, alert  and oriented  Airway & Oxygen Therapy: Patient Spontanous Breathing and Patient connected to face mask oxygen  Post-op Assessment: Report given to RN  Post vital signs: Reviewed and stable  Last Vitals:  Vitals:   11/06/16 1400 11/06/16 1526  BP: 108/72 (!) 113/56  Pulse: 72 (!) 118  Resp:  (!) 22  Temp:  (!) 38 C    Last Pain:  Vitals:   11/06/16 1418  TempSrc:   PainSc: 6          Complications: No apparent anesthesia complications

## 2016-11-06 NOTE — Consult Note (Signed)
2:20 PM   Tracy Whitaker 02-Oct-1981 [id number]  Referring provider: Dr. Lavonia Drafts  Chief Complaint  Patient presents with  . Flank Pain    HPI: The patient is a 35 year old female who presented to the hospital with left flank pain and was diagnosed with a left 4 mm UVJ stone. This is her first episode of nephrolithiasis. She has been experiencing worsening left flank pain for the last 4 days. She has been unable to tolerate diet for the last 2 days. She has severe nausea and vomiting. She has subjective fevers. In the emergency department, her urinalysis was suggestive of a urinary tract infection. She was also hypotensive. An urgent care where she was prior to being transferred to the emergency room, she also apparently was tachycardic at that time. Her white blood cell count in creatinine are within normal limits.     PMH: Past Medical History:  Diagnosis Date  . ADHD (attention deficit hyperactivity disorder)   . Polycystic disease, ovaries   . Ulcerative colitis [facility])     Surgical History: Past Surgical History:  Procedure Laterality Date  . Surgery for polycystic ovary       Allergies: No Known Allergies  Family History: Family History  Problem Relation Age of Onset  . Kidney Stones Father     Social History:  reports that she has been smoking Cigarettes.  She has been smoking about 1.00 pack per day. She has never used smokeless tobacco. She reports that she drinks about 2.4 oz of alcohol per week . She reports that she does not use drugs.  ROS: 12 point ROS negative except for above                                        Physical Exam: BP 108/72   Pulse 72   Temp 99.2 F (37.3 C) (Oral)   Resp 18   SpO2 99%   Constitutional:  Alert and oriented, No acute distress. HEENT: [place] AT, moist mucus membranes.  Trachea midline, no masses. Cardiovascular: No clubbing, cyanosis, or edema. Respiratory: Normal respiratory effort, no  increased work of breathing. GI: Abdomen is soft, nontender, nondistended, no abdominal masses GU: Left CVA tenderness.  Skin: No rashes, bruises or suspicious lesions. Lymph: No cervical or inguinal adenopathy. Neurologic: Grossly intact, no focal deficits, moving all 4 extremities. Psychiatric: Normal mood and affect.  Laboratory Data: Lab Results  Component Value Date   WBC 9.6 11/06/2016   HGB 14.9 11/06/2016   HCT 42.2 11/06/2016   MCV 91.3 11/06/2016   PLT 193 11/06/2016    Lab Results  Component Value Date   CREATININE 0.88 11/06/2016    No results found for: PSA  No results found for: TESTOSTERONE  Lab Results  Component Value Date   HGBA1C 5.0 04/16/2016    Urinalysis    Component Value Date/Time   COLORURINE GREEN (A) 11/06/2016 0935   APPEARANCEUR CLEAR (A) 11/06/2016 0935   LABSPEC 1.014 11/06/2016 0935   PHURINE 6.0 11/06/2016 0935   GLUCOSEU NEGATIVE 11/06/2016 0935   HGBUR SMALL (A) 11/06/2016 0935   BILIRUBINUR NEGATIVE 11/06/2016 0935   BILIRUBINUR small 02/09/2016 1548   KETONESUR 20 (A) 11/06/2016 0935   PROTEINUR 100 (A) 11/06/2016 0935   UROBILINOGEN 0.2 02/09/2016 1548   NITRITE NEGATIVE 11/06/2016 0935   LEUKOCYTESUR MODERATE (A) 11/06/2016 0935    Pertinent Imaging: Reviewed  as above  Assessment & Plan:    1. Left ureteral stone 2. Probable UTI I discussed the patient given her clinical scenario of an obstructing left ureteral stone, hypotension, tachycardia, and a positive urinalysis that she is at risk of sepsis. We discussed that she would benefit from cystoscopy with left ureteral stent placement to decompress her left upper tract collecting system. She understands the goal of this procedure will be to unobstruct the infected urine proximal to her stone. She does understand that we will not be addressing her stone burden at this time. She understands the risks, benefits, and indications for this procedure. We'll plan for cystoscopy  with left ureteral stent placement. We'll also place a Foley catheter at the end of the procedure which the patient is aware of. She'll be admitted to the hospitalist service after the procedure for management of her infection. She'll need definitive stone management in the future as an outpatient once her infection has cleared.  Due to her pending sepsis, we consider this an emergent case. She has not had any solid food for 2 days. She did have 6 ounces of clear water approximate 5 hours ago.  Nickie Retort, MD  [facility] [address], [place], [place] [address] [phone]

## 2016-11-06 NOTE — ED Triage Notes (Signed)
Pt reports left sided flank pain on Monday pt states now she is having pain with urination and a headache, pt went to urgent care who brought her to the ER for treatment

## 2016-11-06 NOTE — Anesthesia Preprocedure Evaluation (Signed)
Anesthesia Evaluation  Patient identified by MRN, date of birth, ID band Patient awake    Reviewed: Allergy & Precautions, H&P , NPO status , Patient's Chart, lab work & pertinent test results  History of Anesthesia Complications Negative for: history of anesthetic complications  Airway Mallampati: II  TM Distance: >3 FB Neck ROM: full    Dental  (+) Poor Dentition, Chipped, Implants   Pulmonary neg shortness of breath, Current Smoker,    Pulmonary exam normal breath sounds clear to auscultation       Cardiovascular Exercise Tolerance: Good (-) angina(-) Past MI and (-) DOE negative cardio ROS Normal cardiovascular exam Rhythm:regular Rate:Normal     Neuro/Psych PSYCHIATRIC DISORDERS negative neurological ROS     GI/Hepatic Neg liver ROS, PUD, GERD  Controlled,  Endo/Other    Renal/GU Renal disease     Musculoskeletal   Abdominal   Peds  Hematology negative hematology ROS (+)   Anesthesia Other Findings Past Medical History: No date: ADHD (attention deficit hyperactivity disorder) No date: Polycystic disease, ovaries No date: Ulcerative colitis ([place])  Past Surgical History: No date: Surgery for polycystic ovary     Reproductive/Obstetrics negative OB ROS                             Anesthesia Physical Anesthesia Plan  ASA: III  Anesthesia Plan: General LMA   Post-op Pain Management:    Induction:   Airway Management Planned:   Additional Equipment:   Intra-op Plan:   Post-operative Plan:   Informed Consent: I have reviewed the patients History and Physical, chart, labs and discussed the procedure including the risks, benefits and alternatives for the proposed anesthesia with the patient or authorized representative who has indicated his/her understanding and acceptance.   Dental Advisory Given  Plan Discussed with: Anesthesiologist, CRNA and Surgeon  Anesthesia  Plan Comments:         Anesthesia Quick Evaluation

## 2016-11-06 NOTE — H&P (Addendum)
[place] at [place] NAME: Tracy Whitaker    MR#:  [id number]  DATE OF BIRTH:  January 11, 1981  DATE OF ADMISSION:  11/06/2016  PRIMARY CARE PHYSICIAN: Melody Rockney Ghee, CNM   REQUESTING/REFERRING PHYSICIAN: Lavonia Drafts, MD  CHIEF COMPLAINT:   Chief Complaint  Patient presents with  . Flank Pain   Fever, chills and left flank pain for 5 days. HISTORY OF PRESENT ILLNESS:  Tracy Whitaker  is a 35 y.o. female with a known history of ADHD, polycystic ovary disease, UTI and ulcerative colitis. The patient has had fever, chills, left flank pain for 5 days. She also complains of nausea and vomiting but no diarrhea, melena or bloody stool. CAT scan of abdomen and pelvis show left sided mild hydronephrosis. His urinalysis show UTI. The patient is treated with 1 dose of Rocephin in the ED. Urologist on call will do stent placement today.  PAST MEDICAL HISTORY:   Past Medical History:  Diagnosis Date  . ADHD (attention deficit hyperactivity disorder)   . Polycystic disease, ovaries   . Ulcerative colitis ([place])     PAST SURGICAL HISTORY:   Past Surgical History:  Procedure Laterality Date  . Surgery for polycystic ovary      SOCIAL HISTORY:   Social History  Substance Use Topics  . Smoking status: Current Every Day Smoker    Packs/day: 1.00    Types: Cigarettes  . Smokeless tobacco: Never Used  . Alcohol use 2.4 oz/week    4 Standard drinks or equivalent per week    FAMILY HISTORY:   Family History  Problem Relation Age of Onset  . Kidney Stones Father     DRUG ALLERGIES:  No Known Allergies  REVIEW OF SYSTEMS:   Review of Systems  Constitutional: Positive for chills, fever and malaise/fatigue.  HENT: Negative for congestion and sore throat.   Eyes: Negative for blurred vision and double vision.  Respiratory: Negative for cough and shortness of breath.   Cardiovascular: Negative for chest pain, palpitations and leg  swelling.  Gastrointestinal: Positive for abdominal pain, nausea and vomiting. Negative for blood in stool, diarrhea and melena.  Genitourinary: Positive for dysuria, flank pain, frequency and urgency. Negative for hematuria.  Musculoskeletal: Negative for joint pain.  Skin: Negative for itching and rash.  Neurological: Negative for dizziness, focal weakness, loss of consciousness and headaches.  Psychiatric/Behavioral: Negative for depression. The patient is not nervous/anxious.     MEDICATIONS AT HOME:   Prior to Admission medications   Medication Sig Start Date End Date Taking? Authorizing Provider  acyclovir (ZOVIRAX) 800 MG tablet Take 1 tablet (800 mg total) by mouth 2 (two) times daily. 11/02/16  Yes Melody N Shambley, CNM  amphetamine-dextroamphetamine (ADDERALL) 20 MG tablet Take 1 tablet (20 mg total) by mouth 2 (two) times daily. 09/07/16  Yes Melody N Shambley, CNM  DELZICOL 400 MG CPDR DR capsule Take 800 mg by mouth 3 (three) times daily. 10/05/16  Yes Historical Provider, MD  escitalopram (LEXAPRO) 20 MG tablet TAKE 1 TABLET (20 MG TOTAL) BY MOUTH DAILY. 09/20/16  Yes Melody N Shambley, CNM  norgestrel-ethinyl estradiol (LO/OVRAL,CRYSELLE) 0.3-30 MG-MCG tablet Take 1 tablet by mouth daily. 10/31/15  Yes Melody N Shambley, CNM  QUEtiapine (SEROQUEL) 200 MG tablet TAKE 1 TABLET (200 MG TOTAL) BY MOUTH AT BEDTIME. 07/20/16  Yes Melody N Shambley, CNM  acyclovir ointment (ZOVIRAX) 5 % Apply 1 application topically every 3 (three) hours. Patient not taking: Reported on  11/06/2016 09/08/16   Melody N Shambley, CNM  buPROPion (WELLBUTRIN XL) 300 MG 24 hr tablet Take 1 tablet (300 mg total) by mouth daily. Patient not taking: Reported on 11/06/2016 02/10/16   Melody N Shambley, CNM  cyanocobalamin (,VITAMIN B-12,) 1000 MCG/ML injection Inject 1 mL (1,000 mcg total) into the muscle every 30 (thirty) days. Patient not taking: Reported on 11/06/2016 06/15/16   Melody N Shambley, CNM    HYDROcodone-acetaminophen (NORCO/VICODIN) 5-325 MG tablet Take 1 tablet by mouth every 6 (six) hours as needed. Patient not taking: Reported on 11/06/2016 10/31/15   Melody N Shambley, CNM  metroNIDAZOLE (FLAGYL) 500 MG tablet Take 1 tablet (500 mg total) by mouth 2 (two) times daily. Patient not taking: Reported on 11/06/2016 06/15/16   Melody N Shambley, CNM  phentermine (ADIPEX-P) 37.5 MG tablet Take 1 tablet (37.5 mg total) by mouth daily before breakfast. Patient not taking: Reported on 11/06/2016 06/15/16   Melody N Shambley, CNM      VITAL SIGNS:  Blood pressure 100/80, pulse 95, temperature 99.2 F (37.3 C), temperature source Oral, resp. rate 18, SpO2 97 %.  PHYSICAL EXAMINATION:  Physical Exam  GENERAL:  35 y.o.-year-old patient lying in the bed with no acute distress.  EYES: Pupils equal, round, reactive to light and accommodation. No scleral icterus. Extraocular muscles intact.  HEENT: Head atraumatic, normocephalic. Oropharynx and nasopharynx clear.  NECK:  Supple, no jugular venous distention. No thyroid enlargement, no tenderness.  LUNGS: Normal breath sounds bilaterally, no wheezing, rales,rhonchi or crepitation. No use of accessory muscles of respiration.  CARDIOVASCULAR: S1, S2 normal. No murmurs, rubs, or gallops.  ABDOMEN: Soft, nontender, nondistended. Bowel sounds present. No organomegaly or mass. Left CVA tenderness. EXTREMITIES: No pedal edema, cyanosis, or clubbing.  NEUROLOGIC: Cranial nerves II through XII are intact. Muscle strength 5/5 in all extremities. Sensation intact. Gait not checked.  PSYCHIATRIC: The patient is alert and oriented x 3.  SKIN: No obvious rash, lesion, or ulcer.   LABORATORY PANEL:   CBC  Recent Labs Lab 11/06/16 1008  WBC 9.6  HGB 14.9  HCT 42.2  PLT 193   ------------------------------------------------------------------------------------------------------------------  Chemistries   Recent Labs Lab 11/06/16 1008  NA  131*  K 3.4*  CL 99*  CO2 21*  GLUCOSE 103*  BUN 8  CREATININE 0.88  CALCIUM 9.1  AST 26  ALT 25  ALKPHOS 43  BILITOT 0.7   ------------------------------------------------------------------------------------------------------------------  Cardiac Enzymes No results for input(s): TROPONINI in the last 168 hours. ------------------------------------------------------------------------------------------------------------------  RADIOLOGY:  Ct Renal Stone Study  Result Date: 11/06/2016 CLINICAL DATA:  Left side flank pain EXAM: CT ABDOMEN AND PELVIS WITHOUT CONTRAST TECHNIQUE: Multidetector CT imaging of the abdomen and pelvis was performed following the standard protocol without IV contrast. COMPARISON:  None. FINDINGS: Lower chest: Lung bases are clear. No effusions. Heart is normal size. Hepatobiliary: No focal hepatic abnormality. Gallbladder unremarkable. Pancreas: No focal abnormality or ductal dilatation. Spleen: No focal abnormality.  Normal size. Adrenals/Urinary Tract: Mild left hydronephrosis due to 3-4 mm distal left ureteral stone. No hydronephrosis on the right. No renal stones. Adrenal glands and urinary bladder unremarkable. Stomach/Bowel: Stomach, large and small bowel grossly unremarkable. Vascular/Lymphatic: No evidence of aneurysm or adenopathy. Reproductive: Uterus and adnexa unremarkable.  No mass. Other: No free fluid or free air. Musculoskeletal: None IMPRESSION: Rear 4 mm distal left ureteral stone with mild left hydronephrosis. Electronically Signed   By: Rolm Baptise M.D.   On: 11/06/2016 12:32      IMPRESSION  AND PLAN:   Left hydronephrosis with nephrolithiasis and UTI. The patient Will be placed for observation. Continue Rocephin IV, follow-up urine culture, IV fluid support and follow-up urologist for possible stent placement.  Hyponatremia, start normal saline IV and follow-up BMP.  Hypokalemia. Potassium supplement with normal saline.  Ulcerative  colitis. Continue home medication.  Tobacco abuse. Smoking cessation was counseled for 3 minutes. Given nicotine patch.   All the records are reviewed and case discussed with ED provider. Management plans discussed with the patient, family and they are in agreement.  CODE STATUS: full code  TOTAL TIME TAKING CARE OF THIS PATIENT: 55 minutes.    Demetrios Loll M.D on 11/06/2016 at 2:01 PM  Between 7am to 6pm - Pager - [phone]  After 6pm go to [email or web address] - Proofreader  [facility]  Office  [phone]  CC: Primary care physician; Joylene Igo, CNM   Note: This dictation was prepared with Dragon dictation along with smaller phrase technology. Any transcriptional errors that result from this process are unintentional.

## 2016-11-06 NOTE — Anesthesia Postprocedure Evaluation (Signed)
Anesthesia Post Note  Patient: Tracy Whitaker  Procedure(s) Performed: Procedure(s) (LRB): CYSTOSCOPY WITH STENT PLACEMENT (Left)  Patient location during evaluation: PACU Anesthesia Type: General Level of consciousness: awake and alert Pain management: pain level controlled Vital Signs Assessment: post-procedure vital signs reviewed and stable Respiratory status: spontaneous breathing, nonlabored ventilation, respiratory function stable and patient connected to nasal cannula oxygen Cardiovascular status: blood pressure returned to baseline and stable Postop Assessment: no signs of nausea or vomiting Anesthetic complications: no    Last Vitals:  Vitals:   11/06/16 1600 11/06/16 1650  BP: 112/64 (!) 121/107  Pulse: 95 (!) 102  Resp: 16 16  Temp: 37.8 C (!) 38.7 C    Last Pain:  Vitals:   11/06/16 1650  TempSrc: Oral  PainSc:                  Precious Haws Admire [place]

## 2016-11-06 NOTE — ED Notes (Signed)
MD to bedside at this time. Pt given pain medication, states pain returned after she emptied her bladder, pt was assisted to the bathroom by her mother. Pt is alert and oriented at this time. NAD noted. Will continue to monitor for further patient needs.

## 2016-11-06 NOTE — ED Notes (Signed)
Pt taken to CT at this time. Explained would begin abx and fluids when she returned. Pt states understanding. Will continue to monitor for further patient needs.

## 2016-11-07 DIAGNOSIS — F909 Attention-deficit hyperactivity disorder, unspecified type: Secondary | ICD-10-CM | POA: Diagnosis present

## 2016-11-07 DIAGNOSIS — N201 Calculus of ureter: Secondary | ICD-10-CM | POA: Diagnosis present

## 2016-11-07 DIAGNOSIS — A419 Sepsis, unspecified organism: Secondary | ICD-10-CM | POA: Diagnosis present

## 2016-11-07 DIAGNOSIS — Z23 Encounter for immunization: Secondary | ICD-10-CM | POA: Diagnosis not present

## 2016-11-07 DIAGNOSIS — N3289 Other specified disorders of bladder: Secondary | ICD-10-CM | POA: Diagnosis present

## 2016-11-07 DIAGNOSIS — E871 Hypo-osmolality and hyponatremia: Secondary | ICD-10-CM | POA: Diagnosis present

## 2016-11-07 DIAGNOSIS — Z79891 Long term (current) use of opiate analgesic: Secondary | ICD-10-CM | POA: Diagnosis not present

## 2016-11-07 DIAGNOSIS — Z79899 Other long term (current) drug therapy: Secondary | ICD-10-CM | POA: Diagnosis not present

## 2016-11-07 DIAGNOSIS — R109 Unspecified abdominal pain: Secondary | ICD-10-CM | POA: Diagnosis not present

## 2016-11-07 DIAGNOSIS — N3001 Acute cystitis with hematuria: Secondary | ICD-10-CM | POA: Diagnosis present

## 2016-11-07 DIAGNOSIS — N132 Hydronephrosis with renal and ureteral calculous obstruction: Secondary | ICD-10-CM | POA: Diagnosis present

## 2016-11-07 DIAGNOSIS — K5190 Ulcerative colitis, unspecified, without complications: Secondary | ICD-10-CM | POA: Diagnosis present

## 2016-11-07 DIAGNOSIS — Z793 Long term (current) use of hormonal contraceptives: Secondary | ICD-10-CM | POA: Diagnosis not present

## 2016-11-07 DIAGNOSIS — F17210 Nicotine dependence, cigarettes, uncomplicated: Secondary | ICD-10-CM | POA: Diagnosis present

## 2016-11-07 DIAGNOSIS — Z841 Family history of disorders of kidney and ureter: Secondary | ICD-10-CM | POA: Diagnosis not present

## 2016-11-07 DIAGNOSIS — E876 Hypokalemia: Secondary | ICD-10-CM | POA: Diagnosis present

## 2016-11-07 DIAGNOSIS — E282 Polycystic ovarian syndrome: Secondary | ICD-10-CM | POA: Diagnosis present

## 2016-11-07 DIAGNOSIS — Z7289 Other problems related to lifestyle: Secondary | ICD-10-CM | POA: Diagnosis not present

## 2016-11-07 LAB — BASIC METABOLIC PANEL
Anion gap: 4 — ABNORMAL LOW (ref 5–15)
BUN: 6 mg/dL (ref 6–20)
CO2: 22 mmol/L (ref 22–32)
Calcium: 7.3 mg/dL — ABNORMAL LOW (ref 8.9–10.3)
Chloride: 106 mmol/L (ref 101–111)
Creatinine, Ser: 0.71 mg/dL (ref 0.44–1.00)
GFR calc Af Amer: >60 mL/min (ref >60)
GFR calc non Af Amer: >60 mL/min (ref >60)
Glucose, Bld: 96 mg/dL (ref 65–99)
Potassium: 3.5 mmol/L (ref 3.5–5.1)
Sodium: 132 mmol/L — ABNORMAL LOW (ref 135–145)

## 2016-11-07 LAB — CBC
HCT: 34.5 % — ABNORMAL LOW (ref 35.0–47.0)
Hemoglobin: 12 g/dL (ref 12.0–16.0)
MCH: 32.2 pg (ref 26.0–34.0)
MCHC: 34.8 g/dL (ref 32.0–36.0)
MCV: 92.5 fL (ref 80.0–100.0)
Platelets: 148 10*3/uL — ABNORMAL LOW (ref 150–440)
RBC: 3.73 MIL/uL — ABNORMAL LOW (ref 3.80–5.20)
RDW: 12.8 % (ref 11.5–14.5)
WBC: 6 10*3/uL (ref 3.6–11.0)

## 2016-11-07 LAB — URINE CULTURE: Culture: NO GROWTH

## 2016-11-07 MED ORDER — OXYBUTYNIN CHLORIDE 5 MG PO TABS
5.0000 mg | ORAL_TABLET | Freq: Three times a day (TID) | ORAL | Status: DC | PRN
  Administered 2016-11-07 – 2016-11-08 (×2): 5 mg via ORAL
  Filled 2016-11-07 (×2): qty 1

## 2016-11-07 MED ORDER — TAMSULOSIN HCL 0.4 MG PO CAPS
0.4000 mg | ORAL_CAPSULE | Freq: Every day | ORAL | Status: DC
  Administered 2016-11-07 – 2016-11-09 (×3): 0.4 mg via ORAL
  Filled 2016-11-07 (×3): qty 1

## 2016-11-07 MED ORDER — CALCIUM CARBONATE ANTACID 500 MG PO CHEW
1.0000 | CHEWABLE_TABLET | Freq: Three times a day (TID) | ORAL | Status: DC | PRN
  Administered 2016-11-07: 200 mg via ORAL
  Filled 2016-11-07: qty 1

## 2016-11-07 MED ORDER — CEFTRIAXONE SODIUM-DEXTROSE 1-3.74 GM-% IV SOLR: 2.0000 g | INTRAVENOUS | Status: DC

## 2016-11-07 MED ORDER — OXYCODONE HCL 5 MG PO TABS
5.0000 mg | ORAL_TABLET | ORAL | Status: DC | PRN
  Administered 2016-11-08 – 2016-11-09 (×4): 5 mg via ORAL
  Filled 2016-11-07 (×4): qty 1

## 2016-11-07 MED ORDER — CEFTRIAXONE SODIUM-DEXTROSE 2-2.22 GM-% IV SOLR
2.0000 g | INTRAVENOUS | Status: DC
  Administered 2016-11-07 – 2016-11-09 (×3): 2 g via INTRAVENOUS
  Filled 2016-11-07 (×3): qty 50

## 2016-11-07 MED ORDER — OXYBUTYNIN CHLORIDE 5 MG/5ML PO SYRP: 5.0000 mg | ORAL_SOLUTION | Freq: Three times a day (TID) | ORAL | Status: DC | PRN

## 2016-11-07 MED ORDER — TRAMADOL HCL 50 MG PO TABS
100.0000 mg | ORAL_TABLET | Freq: Four times a day (QID) | ORAL | Status: DC | PRN
  Administered 2016-11-07: 100 mg via ORAL
  Filled 2016-11-07: qty 1

## 2016-11-07 NOTE — Progress Notes (Signed)
Febrile, hypotensive, and tachycardic intermittently throughout the night C/o bladder spasms w/ relief from B & O supp Appetite improving UCx pending  Vitals:   11/06/16 1600 11/06/16 1650 11/06/16 2044 11/07/16 0615  BP: 112/64 (!) 121/107 (!) 87/50 (!) 85/49  Pulse: 95 (!) 102 76 89  Resp: 16 16 16 18   Temp: 100.1 F (37.8 C) (!) 101.7 F (38.7 C) 98.4 F (36.9 C) (!) 100.4 F (38 C)  TempSrc: Oral Oral Oral Oral  SpO2: 98% 100% 99% 95%  Weight: 174 lb 1.6 oz (79 kg)     Height: 5' 4.5" (1.638 m)      I/O last 3 completed shifts: In: 2176.3 [P.O.:240; I.V.:1936.3] Out: 1150 [Urine:1150] Total I/O In: -  Out: 400 [Urine:400]   NAD Soft NT ND Foley clear  CBC    Component Value Date/Time   WBC 6.0 11/07/2016 0624   RBC 3.73 (L) 11/07/2016 0624   HGB 12.0 11/07/2016 0624   HCT 34.5 (L) 11/07/2016 0624   PLT 148 (L) 11/07/2016 0624   MCV 92.5 11/07/2016 0624   MCH 32.2 11/07/2016 0624   MCHC 34.8 11/07/2016 0624   RDW 12.8 11/07/2016 0624   BMP Latest Ref Rng & Units 11/07/2016 11/06/2016 04/16/2016  Glucose 65 - 99 mg/dL 96 103(H) 87  BUN 6 - 20 mg/dL 6 8 10   Creatinine 0.44 - 1.00 mg/dL 0.71 0.88 0.73  BUN/Creat Ratio 9 - 23 - - 14  Sodium 135 - 145 mmol/L 132(L) 131(L) 139  Potassium 3.5 - 5.1 mmol/L 3.5 3.4(L) 4.3  Chloride 101 - 111 mmol/L 106 99(L) 102  CO2 22 - 32 mmol/L 22 21(L) 22  Calcium 8.9 - 10.3 mg/dL 7.3(L) 9.1 9.2   POD 1 cysto, L stent for infected stone -recommend foley for another day for maximal urinary drainage 2/2 fever, tachycardia, hypotension -continue abx pending c/s results -will add ditropan tid prn for bladder spasms. Continue B&O supp prn -will need outpt definitive stone management as outpatient after infection has cleared.

## 2016-11-07 NOTE — Progress Notes (Signed)
Spoke with Dr. Jannifer Franklin about patient request for tums. New order for tums given. Tracy Whitaker

## 2016-11-07 NOTE — Progress Notes (Signed)
[place] at [place] NAME: Tracy Whitaker    MRN#:  [id number]  DATE OF BIRTH:  Aug 19, 1981  SUBJECTIVE:  Hospital Day: 0 days Tracy Whitaker is a 35 y.o. female presenting with Flank Pain .   Overnight events: Left ureteral stent placed yesterday Interval Events: Still febrile this morning complains of bladder spasm  REVIEW OF SYSTEMS:  CONSTITUTIONAL: Positive fever, denies fatigue or weakness.  EYES: No blurred or double vision.  EARS, NOSE, AND THROAT: No tinnitus or ear pain.  RESPIRATORY: No cough, shortness of breath, wheezing or hemoptysis.  CARDIOVASCULAR: No chest pain, orthopnea, edema.  GASTROINTESTINAL: No nausea, vomiting, diarrhea or abdominal pain.  GENITOURINARY: No dysuria, hematuria.  ENDOCRINE: No polyuria, nocturia,  HEMATOLOGY: No anemia, easy bruising or bleeding SKIN: No rash or lesion. MUSCULOSKELETAL: No joint pain or arthritis.   NEUROLOGIC: No tingling, numbness, weakness.  PSYCHIATRY: No anxiety or depression.   DRUG ALLERGIES:  No Known Allergies  VITALS:  Blood pressure (!) 102/55, pulse 90, temperature 98.7 F (37.1 C), temperature source Oral, resp. rate 18, height 5' 4.5" (1.638 m), weight 79 kg (174 lb 1.6 oz), SpO2 100 %.  PHYSICAL EXAMINATION:  VITAL SIGNS: Vitals:   11/07/16 0615 11/07/16 1223  BP: (!) 85/49 (!) 102/55  Pulse: 89 90  Resp: 18 18  Temp: (!) 100.4 F (38 C) 98.7 F (37.1 C)   GENERAL:35 y.o.female currently in no acute distress.  HEAD: Normocephalic, atraumatic.  EYES: Pupils equal, round, reactive to light. Extraocular muscles intact. No scleral icterus.  MOUTH: Moist mucosal membrane. Dentition intact. No abscess noted.  EAR, NOSE, THROAT: Clear without exudates. No external lesions.  NECK: Supple. No thyromegaly. No nodules. No JVD.  PULMONARY: Clear to ascultation, without wheeze rails or rhonci. No use of accessory muscles, Good respiratory effort. good  air entry bilaterally CHEST: Nontender to palpation.  CARDIOVASCULAR: S1 and S2. Regular rate and rhythm. No murmurs, rubs, or gallops. No edema. Pedal pulses 2+ bilaterally.  GASTROINTESTINAL: Soft, nontender, nondistended. No masses. Positive bowel sounds. No hepatosplenomegaly.  MUSCULOSKELETAL: No swelling, clubbing, or edema. Range of motion full in all extremities.  NEUROLOGIC: Cranial nerves II through XII are intact. No gross focal neurological deficits. Sensation intact. Reflexes intact.  SKIN: No ulceration, lesions, rashes, or cyanosis. Skin warm and dry. Turgor intact.  PSYCHIATRIC: Mood, affect within normal limits. The patient is awake, alert and oriented x 3. Insight, judgment intact.      LABORATORY PANEL:   CBC  Recent Labs Lab 11/07/16 0624  WBC 6.0  HGB 12.0  HCT 34.5*  PLT 148*   ------------------------------------------------------------------------------------------------------------------  Chemistries   Recent Labs Lab 11/06/16 1008 11/07/16 0624  NA 131* 132*  K 3.4* 3.5  CL 99* 106  CO2 21* 22  GLUCOSE 103* 96  BUN 8 6  CREATININE 0.88 0.71  CALCIUM 9.1 7.3*  AST 26  --   ALT 25  --   ALKPHOS 43  --   BILITOT 0.7  --    ------------------------------------------------------------------------------------------------------------------  Cardiac Enzymes No results for input(s): TROPONINI in the last 168 hours. ------------------------------------------------------------------------------------------------------------------  RADIOLOGY:  Ct Renal Stone Study  Result Date: 11/06/2016 CLINICAL DATA:  Left side flank pain EXAM: CT ABDOMEN AND PELVIS WITHOUT CONTRAST TECHNIQUE: Multidetector CT imaging of the abdomen and pelvis was performed following the standard protocol without IV contrast. COMPARISON:  None. FINDINGS: Lower chest: Lung bases are clear. No effusions. Heart is normal size. Hepatobiliary: No focal  hepatic abnormality.  Gallbladder unremarkable. Pancreas: No focal abnormality or ductal dilatation. Spleen: No focal abnormality.  Normal size. Adrenals/Urinary Tract: Mild left hydronephrosis due to 3-4 mm distal left ureteral stone. No hydronephrosis on the right. No renal stones. Adrenal glands and urinary bladder unremarkable. Stomach/Bowel: Stomach, large and small bowel grossly unremarkable. Vascular/Lymphatic: No evidence of aneurysm or adenopathy. Reproductive: Uterus and adnexa unremarkable.  No mass. Other: No free fluid or free air. Musculoskeletal: None IMPRESSION: Rear 4 mm distal left ureteral stone with mild left hydronephrosis. Electronically Signed   By: Rolm Baptise M.D.   On: 11/06/2016 12:32    EKG:  No orders found for this or any previous visit.  ASSESSMENT AND PLAN:   Tracy Whitaker is a 35 y.o. female presenting with Flank Pain . Admitted 11/06/2016 : Day #: 0 days 1. Sepsis: Present on admission, urinary source continue current antibiotics-increased dosage of ceftriaxone 2 g, follow culture data adjust accordingly 2. Left ureteral stent placed per urology-Foley to remain in place, urology input appreciated 3. Hyponatremia: IV fluid hydration   All the records are reviewed and case discussed with Care Management/Social Workerr. Management plans discussed with the patient, family and they are in agreement.  CODE STATUS: full TOTAL TIME TAKING CARE OF THIS PATIENT: 33 minutes.   POSSIBLE D/C IN 1-2DAYS, DEPENDING ON CLINICAL CONDITION.   Tracy Whitaker,  Karenann Cai.D on 11/07/2016 at 12:31 PM  Between 7am to 6pm - Pager - [phone]  After 6pm: House Pager: - [phone]  [facility]  Office  [phone]  CC: Primary care physician; Joylene Igo, CNM

## 2016-11-07 NOTE — Progress Notes (Signed)
Pt c/o feeling "bladder fullness". F/C patent draining clear, amber urine. Bladder scan revealed no urinary retention. Pt reports relief with belladona suppositories. Will continue to monitor

## 2016-11-08 ENCOUNTER — Encounter: Payer: Self-pay | Admitting: Urology

## 2016-11-08 DIAGNOSIS — R109 Unspecified abdominal pain: Secondary | ICD-10-CM

## 2016-11-08 DIAGNOSIS — N3001 Acute cystitis with hematuria: Secondary | ICD-10-CM

## 2016-11-08 DIAGNOSIS — N201 Calculus of ureter: Secondary | ICD-10-CM

## 2016-11-08 LAB — URINALYSIS, ROUTINE W REFLEX MICROSCOPIC
Bacteria, UA: NONE SEEN
Bilirubin Urine: NEGATIVE
Glucose, UA: NEGATIVE mg/dL
Ketones, ur: 5 mg/dL — AB
Nitrite: NEGATIVE
Protein, ur: NEGATIVE mg/dL
Specific Gravity, Urine: 1.009 (ref 1.005–1.030)
Squamous Epithelial / LPF: NONE SEEN
pH: 6 (ref 5.0–8.0)

## 2016-11-08 MED ORDER — SODIUM CHLORIDE 0.9 % IV SOLN
INTRAVENOUS | Status: DC
  Administered 2016-11-08: 08:00:00 via INTRAVENOUS

## 2016-11-08 NOTE — Progress Notes (Signed)
[place] at [place] NAME: Tracy Whitaker    MRN#:  [id number]  DATE OF BIRTH:  1981/10/18  SUBJECTIVE:  Hospital Day: 1 day Tracy Whitaker is a 35 y.o. female presenting with Flank Pain .   Overnight events: No overnight events  Interval Events: States abdominal pain is better, had fever overnight  REVIEW OF SYSTEMS:  CONSTITUTIONAL: Positive fever, denies fatigue or weakness.  EYES: No blurred or double vision.  EARS, NOSE, AND THROAT: No tinnitus or ear pain.  RESPIRATORY: No cough, shortness of breath, wheezing or hemoptysis.  CARDIOVASCULAR: No chest pain, orthopnea, edema.  GASTROINTESTINAL: No nausea, vomiting, diarrhea or abdominal pain.  GENITOURINARY: No dysuria, hematuria.  ENDOCRINE: No polyuria, nocturia,  HEMATOLOGY: No anemia, easy bruising or bleeding SKIN: No rash or lesion. MUSCULOSKELETAL: No joint pain or arthritis.   NEUROLOGIC: No tingling, numbness, weakness.  PSYCHIATRY: No anxiety or depression.   DRUG ALLERGIES:  No Known Allergies  VITALS:  Blood pressure 115/76, pulse 77, temperature 98.5 F (36.9 C), temperature source Oral, resp. rate 18, height 5' 4.5" (1.638 m), weight 79 kg (174 lb 1.6 oz), SpO2 98 %.  PHYSICAL EXAMINATION:  VITAL SIGNS: Vitals:   11/08/16 0809 11/08/16 1300  BP: 110/73 115/76  Pulse: 77 77  Resp: 16 18  Temp: 98.8 F (37.1 C) 98.5 F (36.9 C)   GENERAL:35 y.o.female currently in no acute distress.  HEAD: Normocephalic, atraumatic.  EYES: Pupils equal, round, reactive to light. Extraocular muscles intact. No scleral icterus.  MOUTH: Moist mucosal membrane. Dentition intact. No abscess noted.  EAR, NOSE, THROAT: Clear without exudates. No external lesions.  NECK: Supple. No thyromegaly. No nodules. No JVD.  PULMONARY: Clear to ascultation, without wheeze rails or rhonci. No use of accessory muscles, Good respiratory effort. good air entry bilaterally CHEST:  Nontender to palpation.  CARDIOVASCULAR: S1 and S2. Regular rate and rhythm. No murmurs, rubs, or gallops. No edema. Pedal pulses 2+ bilaterally.  GASTROINTESTINAL: Soft, nontender, nondistended. No masses. Positive bowel sounds. No hepatosplenomegaly.  MUSCULOSKELETAL: No swelling, clubbing, or edema. Range of motion full in all extremities.  NEUROLOGIC: Cranial nerves II through XII are intact. No gross focal neurological deficits. Sensation intact. Reflexes intact.  SKIN: No ulceration, lesions, rashes, or cyanosis. Skin warm and dry. Turgor intact.  PSYCHIATRIC: Mood, affect within normal limits. The patient is awake, alert and oriented x 3. Insight, judgment intact.      LABORATORY PANEL:   CBC  Recent Labs Lab 11/07/16 0624  WBC 6.0  HGB 12.0  HCT 34.5*  PLT 148*   ------------------------------------------------------------------------------------------------------------------  Chemistries   Recent Labs Lab 11/06/16 1008 11/07/16 0624  NA 131* 132*  K 3.4* 3.5  CL 99* 106  CO2 21* 22  GLUCOSE 103* 96  BUN 8 6  CREATININE 0.88 0.71  CALCIUM 9.1 7.3*  AST 26  --   ALT 25  --   ALKPHOS 43  --   BILITOT 0.7  --    ------------------------------------------------------------------------------------------------------------------  Cardiac Enzymes No results for input(s): TROPONINI in the last 168 hours. ------------------------------------------------------------------------------------------------------------------  RADIOLOGY:  No results found.  EKG:  No orders found for this or any previous visit.  ASSESSMENT AND PLAN:   Tracy Whitaker is a 35 y.o. female presenting with Flank Pain . Admitted 11/06/2016 : Day #: 1 day 1. Sepsis: Present on admission, urinary source continue current antibiotics-increased dosage of ceftriaxone 2 g, follow culture data adjust accordinglyGiven continued fever and recheck  urine and blood cultures 2. Left ureteral stent  placed per urology-discontinue Foley catheter 3. Hyponatremia: IV fluid hydration If remains afebrile anticipate discharge tomorrow  All the records are reviewed and case discussed with Care Management/Social Workerr. Management plans discussed with the patient, family and they are in agreement.  CODE STATUS: full TOTAL TIME TAKING CARE OF THIS PATIENT: 33 minutes.   POSSIBLE D/C IN 1-2DAYS, DEPENDING ON CLINICAL CONDITION.   Kalasia Crafton,  Karenann Cai.D on 11/08/2016 at 2:26 PM  Between 7am to 6pm - Pager - [phone]  After 6pm: House Pager: [phone]  [facility]  Office  [phone]  CC: Primary care physician; Joylene Igo, CNM

## 2016-11-08 NOTE — Progress Notes (Signed)
Per Dr. Lavetta Nielsen okay to discontinue IV fluids. Will place order.

## 2016-11-08 NOTE — Progress Notes (Signed)
11/08/16   Urology f/u   Doing well.  Foley out and voiding.  UCx negative to date.  Fever to 101.6 at 9 pm yesterday.     Vitals:   11/08/16 0116 11/08/16 0435 11/08/16 0809 11/08/16 1300  BP:  99/62 110/73 115/76  Pulse:  76 77 77  Resp:  16 16 18   Temp: 98 F (36.7 C) 97.9 F (36.6 C) 98.8 F (37.1 C) 98.5 F (36.9 C)  TempSrc: Oral Oral Oral Oral  SpO2:  97% 100% 98%  Weight:      Height:       I/O last 3 completed shifts: In: 4663.3 [P.O.:720; I.V.:3893.3; IV Piggyback:50] Out: 8400 [Urine:8400] Total I/O In: 1146.8 [I.V.:1046.8; IV Piggyback:100] Out: 1725 [Urine:1725]   NAD Soft NT ND  CBC    Component Value Date/Time   WBC 6.0 11/07/2016 0624   RBC 3.73 (L) 11/07/2016 0624   HGB 12.0 11/07/2016 0624   HCT 34.5 (L) 11/07/2016 0624   PLT 148 (L) 11/07/2016 0624   MCV 92.5 11/07/2016 0624   MCH 32.2 11/07/2016 0624   MCHC 34.8 11/07/2016 0624   RDW 12.8 11/07/2016 0624   BMP Latest Ref Rng & Units 11/07/2016 11/06/2016 04/16/2016  Glucose 65 - 99 mg/dL 96 103(H) 87  BUN 6 - 20 mg/dL 6 8 10   Creatinine 0.44 - 1.00 mg/dL 0.71 0.88 0.73  BUN/Creat Ratio 9 - 23 - - 14  Sodium 135 - 145 mmol/L 132(L) 131(L) 139  Potassium 3.5 - 5.1 mmol/L 3.5 3.4(L) 4.3  Chloride 101 - 111 mmol/L 106 99(L) 102  CO2 22 - 32 mmol/L 22 21(L) 22  Calcium 8.9 - 10.3 mg/dL 7.3(L) 9.1 9.2   POD 2 cysto, L stent for infected stone.  Febrile to 101 last night, afebrile today.    -Foley out and voiding -continue abx pending c/s results - please discharge with abx x 7 days, flomax, ditropan and pain meds -will need outpt definitive stone management as outpatient after infection has cleared  -> apt scheduled for Jan 8th to discuss staged procedure -will sign off, please page with questions or concerns  Hollice Espy, MD

## 2016-11-09 MED ORDER — TRAMADOL HCL 50 MG PO TABS: 100.0000 mg | ORAL_TABLET | Freq: Four times a day (QID) | ORAL | 0 refills | Status: DC | PRN

## 2016-11-09 MED ORDER — CEPHALEXIN 750 MG PO CAPS: 750.0000 mg | ORAL_CAPSULE | Freq: Two times a day (BID) | ORAL | 0 refills | Status: AC

## 2016-11-09 MED ORDER — OXYBUTYNIN CHLORIDE 5 MG PO TABS: 5.0000 mg | ORAL_TABLET | Freq: Three times a day (TID) | ORAL | 0 refills | Status: DC | PRN

## 2016-11-09 MED ORDER — MIRABEGRON ER 25 MG PO TB24: 25.0000 mg | ORAL_TABLET | Freq: Every day | ORAL | 0 refills | Status: DC

## 2016-11-09 MED ORDER — OXYCODONE HCL 5 MG PO TABS: 5.0000 mg | ORAL_TABLET | ORAL | 0 refills | Status: DC | PRN

## 2016-11-09 MED ORDER — TAMSULOSIN HCL 0.4 MG PO CAPS: 0.4000 mg | ORAL_CAPSULE | Freq: Every day | ORAL | 0 refills | Status: DC

## 2016-11-09 MED ORDER — NICOTINE 14 MG/24HR TD PT24: 14.0000 mg | MEDICATED_PATCH | Freq: Every day | TRANSDERMAL | 0 refills | Status: DC

## 2016-11-09 NOTE — Progress Notes (Signed)
Patient A&O, VSS.  Pain controlled by current medication.  Up to BR voiding without difficulty; reports BM today.  Discharge instructions reviewed with patient.  Understanding was verbalized and all questions were answered.  Prescription given.  Patient discharged home via wheelchair in stable condition escorted by nursing staff.

## 2016-11-09 NOTE — Discharge Summary (Addendum)
[place] at [place] NAME: Tracy Whitaker    MR#:  [id number]  DATE OF BIRTH:  12-Jul-1981  DATE OF ADMISSION:  11/06/2016 ADMITTING PHYSICIAN: Demetrios Loll, MD  DATE OF DISCHARGE: 11/09/16  PRIMARY CARE PHYSICIAN: Melody Rockney Ghee, CNM    ADMISSION DIAGNOSIS:  Ureterolithiasis [N20.1] Acute cystitis with hematuria [N30.01] Flank pain, acute [R10.9]  DISCHARGE DIAGNOSIS:  Active Problems:   Hydronephrosis, left   Sepsis ([place])   SECONDARY DIAGNOSIS:   Past Medical History:  Diagnosis Date  . ADHD (attention deficit hyperactivity disorder)   . Polycystic disease, ovaries   . Ulcerative colitis [facility])     HOSPITAL COURSE:  Tracy Whitaker  is a 35 y.o. female admitted 11/06/2016 with chief complaint Flank Pain . Please see H&P performed by Demetrios Loll, MD for further information. Patient presented with the above symptoms meeting septic criteria on admission. Started on ceftriaxone, evaluated by urology - underwent ureteral stent placement without difficulty. Will need to follow with urology for further stone manageemnt   DISCHARGE CONDITIONS:   stable  CONSULTS OBTAINED:  Treatment Team:  Nickie Retort, MD  DRUG ALLERGIES:  No Known Allergies  DISCHARGE MEDICATIONS:   Current Discharge Medication List    START taking these medications   Details  cephALEXin (KEFLEX) 750 MG capsule Take 1 capsule (750 mg total) by mouth 2 (two) times daily. Qty: 14 capsule, Refills: 0    mirabegron ER (MYRBETRIQ) 25 MG TB24 tablet Take 1 tablet (25 mg total) by mouth daily. Qty: 30 tablet, Refills: 0    nicotine (NICODERM CQ - DOSED IN MG/24 HOURS) 14 mg/24hr patch Place 1 patch (14 mg total) onto the skin daily. Qty: 28 patch, Refills: 0    oxybutynin (DITROPAN) 5 MG tablet Take 1 tablet (5 mg total) by mouth every 8 (eight) hours as needed for bladder spasms. Qty: 30 tablet, Refills: 0    oxyCODONE (OXY IR/ROXICODONE) 5 MG  immediate release tablet Take 1 tablet (5 mg total) by mouth every 4 (four) hours as needed for moderate pain. Qty: 30 tablet, Refills: 0    tamsulosin (FLOMAX) 0.4 MG CAPS capsule Take 1 capsule (0.4 mg total) by mouth daily. Qty: 30 capsule, Refills: 0      CONTINUE these medications which have NOT CHANGED   Details  acyclovir (ZOVIRAX) 800 MG tablet Take 1 tablet (800 mg total) by mouth 2 (two) times daily. Qty: 60 tablet, Refills: 3    amphetamine-dextroamphetamine (ADDERALL) 20 MG tablet Take 1 tablet (20 mg total) by mouth 2 (two) times daily. Qty: 60 tablet, Refills: 0    DELZICOL 400 MG CPDR DR capsule Take 800 mg by mouth 3 (three) times daily. Refills: 4    escitalopram (LEXAPRO) 20 MG tablet TAKE 1 TABLET (20 MG TOTAL) BY MOUTH DAILY. Qty: 30 tablet, Refills: 4    norgestrel-ethinyl estradiol (LO/OVRAL,CRYSELLE) 0.3-30 MG-MCG tablet Take 1 tablet by mouth daily. Qty: 3 Package, Refills: 4    QUEtiapine (SEROQUEL) 200 MG tablet TAKE 1 TABLET (200 MG TOTAL) BY MOUTH AT BEDTIME. Qty: 30 tablet, Refills: 4      STOP taking these medications     acyclovir ointment (ZOVIRAX) 5 %      buPROPion (WELLBUTRIN XL) 300 MG 24 hr tablet      cyanocobalamin (,VITAMIN B-12,) 1000 MCG/ML injection      HYDROcodone-acetaminophen (NORCO/VICODIN) 5-325 MG tablet      metroNIDAZOLE (FLAGYL) 500 MG tablet  phentermine (ADIPEX-P) 37.5 MG tablet          DISCHARGE INSTRUCTIONS:    DIET:  Regular diet  DISCHARGE CONDITION:  Stable  ACTIVITY:  Activity as tolerated  OXYGEN:  Home Oxygen: No.   Oxygen Delivery: room air  DISCHARGE LOCATION:  home   If you experience worsening of your admission symptoms, develop shortness of breath, life threatening emergency, suicidal or homicidal thoughts you must seek medical attention immediately by calling 911 or calling your MD immediately  if symptoms less severe.  You Must read complete instructions/literature along with  all the possible adverse reactions/side effects for all the Medicines you take and that have been prescribed to you. Take any new Medicines after you have completely understood and accpet all the possible adverse reactions/side effects.   Please note  You were cared for by a hospitalist during your hospital stay. If you have any questions about your discharge medications or the care you received while you were in the hospital after you are discharged, you can call the unit and asked to speak with the hospitalist on call if the hospitalist that took care of you is not available. Once you are discharged, your primary care physician will handle any further medical issues. Please note that NO REFILLS for any discharge medications will be authorized once you are discharged, as it is imperative that you return to your primary care physician (or establish a relationship with a primary care physician if you do not have one) for your aftercare needs so that they can reassess your need for medications and monitor your lab values.    On the day of Discharge:   VITAL SIGNS:  Blood pressure 109/66, pulse 62, temperature 98.9 F (37.2 C), temperature source Oral, resp. rate 16, height 5' 4.5" (1.638 m), weight 79 kg (174 lb 1.6 oz), SpO2 98 %.  I/O:   Intake/Output Summary (Last 24 hours) at 11/09/16 1119 Last data filed at 11/09/16 1021  Gross per 24 hour  Intake          1123.75 ml  Output             2475 ml  Net         -1351.25 ml    PHYSICAL EXAMINATION:  GENERAL:  35 y.o.-year-old patient lying in the bed with no acute distress.  EYES: Pupils equal, round, reactive to light and accommodation. No scleral icterus. Extraocular muscles intact.  HEENT: Head atraumatic, normocephalic. Oropharynx and nasopharynx clear.  NECK:  Supple, no jugular venous distention. No thyroid enlargement, no tenderness.  LUNGS: Normal breath sounds bilaterally, no wheezing, rales,rhonchi or crepitation. No use of  accessory muscles of respiration.  CARDIOVASCULAR: S1, S2 normal. No murmurs, rubs, or gallops.  ABDOMEN: Soft, non-tender, non-distended. Bowel sounds present. No organomegaly or mass.  EXTREMITIES: No pedal edema, cyanosis, or clubbing.  NEUROLOGIC: Cranial nerves II through XII are intact. Muscle strength 5/5 in all extremities. Sensation intact. Gait not checked.  PSYCHIATRIC: The patient is alert and oriented x 3.  SKIN: No obvious rash, lesion, or ulcer.   DATA REVIEW:   CBC  Recent Labs Lab 11/07/16 0624  WBC 6.0  HGB 12.0  HCT 34.5*  PLT 148*    Chemistries   Recent Labs Lab 11/06/16 1008 11/07/16 0624  NA 131* 132*  K 3.4* 3.5  CL 99* 106  CO2 21* 22  GLUCOSE 103* 96  BUN 8 6  CREATININE 0.88 0.71  CALCIUM 9.1 7.3*  AST  26  --   ALT 25  --   ALKPHOS 43  --   BILITOT 0.7  --     Cardiac Enzymes No results for input(s): TROPONINI in the last 168 hours.  Microbiology Results  Results for orders placed or performed during the hospital encounter of 11/06/16  Urine culture     Status: None   Collection Time: 11/06/16  3:04 PM  Result Value Ref Range Status   Specimen Description URINE, RANDOM  Final   Special Requests NONE  Final   Culture NO GROWTH Performed at [facility]   Final   Report Status 11/07/2016 FINAL  Final  CULTURE, BLOOD (ROUTINE X 2) w Reflex to ID Panel     Status: None (Preliminary result)   Collection Time: 11/08/16  8:12 AM  Result Value Ref Range Status   Specimen Description BLOOD LEFT ARM  Final   Special Requests BOTTLES DRAWN AEROBIC AND ANAEROBIC 12CC  Final   Culture NO GROWTH < 24 HOURS  Final   Report Status PENDING  Incomplete  CULTURE, BLOOD (ROUTINE X 2) w Reflex to ID Panel     Status: None (Preliminary result)   Collection Time: 11/08/16  8:19 AM  Result Value Ref Range Status   Specimen Description BLOOD RIGHT ARM  Final   Special Requests   Final    BOTTLES DRAWN AEROBIC AND ANAEROBIC AER [place] ANA [place]     Culture NO GROWTH < 24 HOURS  Final   Report Status PENDING  Incomplete    RADIOLOGY:  No results found.   Management plans discussed with the patient, family and they are in agreement.  CODE STATUS:     Code Status Orders        Start     Ordered   11/06/16 1608  Full code  Continuous     11/06/16 1607    Code Status History    Date Active Date Inactive Code Status Order ID Comments User Context   This patient has a current code status but no historical code status.      TOTAL TIME TAKING CARE OF THIS PATIENT: 33 minutes.    Yaa Donnellan,  Karenann Cai.D on 11/09/2016 at 11:19 AM  Between 7am to 6pm - Pager - [phone]  After 6pm go to [email or web address] - [facility]  Office  [phone]  CC: Primary care physician; Joylene Igo, CNM

## 2016-11-09 NOTE — Progress Notes (Addendum)
   [address], [place] [address]  November 09, 2016  Patient:  Tracy Whitaker Date of Birth: [birth year] Date of Visit:  11/06/2016  To Whom it May Concern:  Please excuse Tracy Whitaker from work from 11/06/2016 until 11/09/16 as she was admitted to the [facility] for medical treatment and has been receiving appropriate care. She may return to work on 11/12/16, sooner if she feels she is able to return sooner than this date.      Please don't hesitate to contact me with questions or concerns by calling  [phone] and asking them to page me directly.   Regino Schultze, MD

## 2016-11-11 ENCOUNTER — Telehealth: Payer: Self-pay

## 2016-11-11 NOTE — Telephone Encounter (Signed)
Pt called stating she felt a sharpe pain in her side last night and after using the bathroom she felt much better. Pt stated that she saw some tiny blood clots in the toilet when she urinated. Pt asked if she had passed her stone.  Reinforced with pt to continue to drink plenty of water and keep f/u imaging and appt. Made pt aware the imaging will show if she passed her stone. Pt voiced understanding.

## 2016-11-12 ENCOUNTER — Telehealth: Payer: Self-pay

## 2016-11-12 NOTE — Telephone Encounter (Signed)
Pt called stating that she is having trouble starting her urine stream. Pt states that once she gets her urine stream started things are ok but its takes about 20 min to get urine started. Per Dr. Pilar Jarvis pt should stop oxybutynin. Pt voiced understanding.

## 2016-11-13 LAB — CULTURE, BLOOD (ROUTINE X 2)
Culture: NO GROWTH
Culture: NO GROWTH

## 2016-11-17 ENCOUNTER — Ambulatory Visit: Payer: BLUE CROSS/BLUE SHIELD

## 2016-11-17 DIAGNOSIS — N1330 Unspecified hydronephrosis: Secondary | ICD-10-CM

## 2016-11-17 NOTE — Progress Notes (Signed)
Pt presented today with c/o severe pain in vaginal area when needing to urinate and post voiding. PVR:34. Per Dr. Pilar Jarvis no ucx was sent. Myrbetriq 67m given.

## 2016-11-18 ENCOUNTER — Telehealth: Payer: Self-pay | Admitting: Urology

## 2016-11-18 ENCOUNTER — Other Ambulatory Visit: Payer: Self-pay | Admitting: *Deleted

## 2016-11-18 NOTE — Telephone Encounter (Signed)
Patient was seen in the office for a nurse visit on 11/17/2016.  Chelsea provided Myrbetriq for bladder spasms.  Patient states that the medication is helping some with the painful urination, but does not touch the constant pain.  She has a stent.

## 2016-11-19 ENCOUNTER — Other Ambulatory Visit: Payer: Self-pay | Admitting: *Deleted

## 2016-11-23 ENCOUNTER — Encounter: Payer: Self-pay | Admitting: Urology

## 2016-11-23 NOTE — Telephone Encounter (Signed)
Patient called asking for a work note for 11-23-2016 and 11-24-2016 she said she is still in a lot of pain and her work is giving her a hard time about being out of work. Can I write her a work note for these days?  Sharyn Lull

## 2016-11-24 ENCOUNTER — Telehealth: Payer: Self-pay

## 2016-11-24 NOTE — Telephone Encounter (Signed)
Pt called stating she is in a lot of pain and wanted another work note to be out for the rest of the week. Reinforced with pt she received a work note for yesterday and today. Reinforced with pt that most pt go back to work within 2 days of having stent placed. Pt stated that today she is feeling ok which is why she returned to work. Reinforced with pt can take tylenol and ibuprofen for pain until next surgery. Pt then had several questions regarding next surgery. Reinforced with pt her appt on 11/29/16 was to discuss all of that further. Pt was frustrated but voiced understanding.

## 2016-11-26 ENCOUNTER — Inpatient Hospital Stay
Admission: EM | Admit: 2016-11-26 | Discharge: 2016-11-28 | DRG: 669 | Disposition: A | Discharge status: Home / Self Care | Payer: BLUE CROSS/BLUE SHIELD | Attending: Internal Medicine | Admitting: Internal Medicine

## 2016-11-26 ENCOUNTER — Encounter: Payer: Self-pay | Admitting: Emergency Medicine

## 2016-11-26 ENCOUNTER — Emergency Department: Payer: BLUE CROSS/BLUE SHIELD

## 2016-11-26 ENCOUNTER — Telehealth: Payer: Self-pay

## 2016-11-26 DIAGNOSIS — Z87442 Personal history of urinary calculi: Secondary | ICD-10-CM | POA: Diagnosis not present

## 2016-11-26 DIAGNOSIS — F909 Attention-deficit hyperactivity disorder, unspecified type: Secondary | ICD-10-CM | POA: Diagnosis present

## 2016-11-26 DIAGNOSIS — R109 Unspecified abdominal pain: Secondary | ICD-10-CM | POA: Diagnosis present

## 2016-11-26 DIAGNOSIS — E876 Hypokalemia: Secondary | ICD-10-CM | POA: Diagnosis present

## 2016-11-26 DIAGNOSIS — E871 Hypo-osmolality and hyponatremia: Secondary | ICD-10-CM | POA: Diagnosis present

## 2016-11-26 DIAGNOSIS — Z87440 Personal history of urinary (tract) infections: Secondary | ICD-10-CM | POA: Diagnosis not present

## 2016-11-26 DIAGNOSIS — F17210 Nicotine dependence, cigarettes, uncomplicated: Secondary | ICD-10-CM | POA: Diagnosis present

## 2016-11-26 DIAGNOSIS — T8384XA Pain from genitourinary prosthetic devices, implants and grafts, initial encounter: Secondary | ICD-10-CM | POA: Diagnosis present

## 2016-11-26 DIAGNOSIS — Y838 Other surgical procedures as the cause of abnormal reaction of the patient, or of later complication, without mention of misadventure at the time of the procedure: Secondary | ICD-10-CM | POA: Diagnosis present

## 2016-11-26 DIAGNOSIS — E282 Polycystic ovarian syndrome: Secondary | ICD-10-CM | POA: Diagnosis present

## 2016-11-26 DIAGNOSIS — Z8711 Personal history of peptic ulcer disease: Secondary | ICD-10-CM | POA: Diagnosis not present

## 2016-11-26 DIAGNOSIS — N132 Hydronephrosis with renal and ureteral calculous obstruction: Principal | ICD-10-CM | POA: Diagnosis present

## 2016-11-26 DIAGNOSIS — K5190 Ulcerative colitis, unspecified, without complications: Secondary | ICD-10-CM | POA: Diagnosis present

## 2016-11-26 DIAGNOSIS — N201 Calculus of ureter: Secondary | ICD-10-CM | POA: Diagnosis not present

## 2016-11-26 DIAGNOSIS — Z793 Long term (current) use of hormonal contraceptives: Secondary | ICD-10-CM

## 2016-11-26 DIAGNOSIS — Z79899 Other long term (current) drug therapy: Secondary | ICD-10-CM

## 2016-11-26 DIAGNOSIS — R52 Pain, unspecified: Secondary | ICD-10-CM | POA: Diagnosis present

## 2016-11-26 DIAGNOSIS — Z91040 Latex allergy status: Secondary | ICD-10-CM

## 2016-11-26 LAB — CBC
HCT: 37.7 % (ref 35.0–47.0)
Hemoglobin: 13 g/dL (ref 12.0–16.0)
MCH: 32.1 pg (ref 26.0–34.0)
MCHC: 34.4 g/dL (ref 32.0–36.0)
MCV: 93.4 fL (ref 80.0–100.0)
Platelets: 253 10*3/uL (ref 150–440)
RBC: 4.03 MIL/uL (ref 3.80–5.20)
RDW: 13.9 % (ref 11.5–14.5)
WBC: 7.2 10*3/uL (ref 3.6–11.0)

## 2016-11-26 LAB — URINALYSIS, COMPLETE (UACMP) WITH MICROSCOPIC
Bilirubin Urine: NEGATIVE
Glucose, UA: NEGATIVE mg/dL
Ketones, ur: NEGATIVE mg/dL
Nitrite: POSITIVE — AB
Protein, ur: NEGATIVE mg/dL
Specific Gravity, Urine: 1.004 — ABNORMAL LOW (ref 1.005–1.030)
pH: 7 (ref 5.0–8.0)

## 2016-11-26 LAB — HEPATIC FUNCTION PANEL
ALT: 15 U/L (ref 14–54)
AST: 21 U/L (ref 15–41)
Albumin: 3.5 g/dL (ref 3.5–5.0)
Alkaline Phosphatase: 40 U/L (ref 38–126)
Bilirubin, Direct: <0.1 mg/dL — ABNORMAL LOW (ref 0.1–0.5)
Total Bilirubin: 0.4 mg/dL (ref 0.3–1.2)
Total Protein: 6.4 g/dL — ABNORMAL LOW (ref 6.5–8.1)

## 2016-11-26 LAB — BASIC METABOLIC PANEL
Anion gap: 6 (ref 5–15)
BUN: 11 mg/dL (ref 6–20)
CO2: 23 mmol/L (ref 22–32)
Calcium: 8.5 mg/dL — ABNORMAL LOW (ref 8.9–10.3)
Chloride: 107 mmol/L (ref 101–111)
Creatinine, Ser: 0.81 mg/dL (ref 0.44–1.00)
GFR calc Af Amer: >60 mL/min (ref >60)
GFR calc non Af Amer: >60 mL/min (ref >60)
Glucose, Bld: 95 mg/dL (ref 65–99)
Potassium: 4 mmol/L (ref 3.5–5.1)
Sodium: 136 mmol/L (ref 135–145)

## 2016-11-26 LAB — HCG, QUANTITATIVE, PREGNANCY: hCG, Beta Chain, Quant, S: <1 m[IU]/mL (ref <5)

## 2016-11-26 LAB — LIPASE, BLOOD: Lipase: 21 U/L (ref 11–51)

## 2016-11-26 MED ORDER — MORPHINE SULFATE (PF) 4 MG/ML IV SOLN
INTRAVENOUS | Status: AC
  Filled 2016-11-26: qty 1

## 2016-11-26 MED ORDER — ONDANSETRON HCL 4 MG/2ML IJ SOLN
4.0000 mg | Freq: Once | INTRAMUSCULAR | Status: AC
  Administered 2016-11-26: 4 mg via INTRAVENOUS

## 2016-11-26 MED ORDER — MORPHINE SULFATE (PF) 4 MG/ML IV SOLN
4.0000 mg | Freq: Once | INTRAVENOUS | Status: AC
  Administered 2016-11-26: 4 mg via INTRAVENOUS

## 2016-11-26 MED ORDER — KETOROLAC TROMETHAMINE 30 MG/ML IJ SOLN
30.0000 mg | Freq: Once | INTRAMUSCULAR | Status: AC
  Administered 2016-11-26: 30 mg via INTRAVENOUS

## 2016-11-26 MED ORDER — MORPHINE SULFATE (PF) 4 MG/ML IV SOLN
INTRAVENOUS | Status: AC
  Administered 2016-11-26: 4 mg via INTRAVENOUS
  Filled 2016-11-26: qty 1

## 2016-11-26 MED ORDER — ONDANSETRON HCL 4 MG/2ML IJ SOLN
INTRAMUSCULAR | Status: AC
  Administered 2016-11-26: 4 mg via INTRAVENOUS
  Filled 2016-11-26: qty 2

## 2016-11-26 MED ORDER — HYDROMORPHONE HCL 1 MG/ML IJ SOLN
1.0000 mg | INTRAMUSCULAR | Status: DC | PRN
  Administered 2016-11-27 (×5): 1 mg via INTRAVENOUS
  Filled 2016-11-26 (×5): qty 1

## 2016-11-26 MED ORDER — SODIUM CHLORIDE 0.9 % IV BOLUS (SEPSIS)
1000.0000 mL | Freq: Once | INTRAVENOUS | Status: AC
  Administered 2016-11-26: 1000 mL via INTRAVENOUS

## 2016-11-26 MED ORDER — FENTANYL CITRATE (PF) 100 MCG/2ML IJ SOLN
50.0000 ug | INTRAMUSCULAR | Status: AC | PRN
  Administered 2016-11-26 (×2): 50 ug via INTRAVENOUS
  Filled 2016-11-26 (×2): qty 2

## 2016-11-26 NOTE — H&P (Signed)
History and Physical   [facility] [place] [facility] Admission History and Physical [employer], D.O.  Patient Name: Tracy Whitaker MR#: [id number] Date of Birth: [birth year] Date of Admission: 11/26/2016  Referring MD/NP/PA: Delman Kitten, MD Primary Care Physician: Joylene Igo, CNM Outpatient Specialists: Harolyn Rutherford, MD   Patient coming from: Home  Chief Complaint: "Left flank pain"  HPI: Tracy Whitaker is a 36 y.o. female with a known history of UC, PCOS, kidney stones, L Hydronephrosis, L uretal stent placement 11/06/16 and frequent UTI presents to the emergency department for evaluation of increasing left flank pain.  Patient was in a usual state of health until 3 days prior to arrival when the left sided flank pain became increasingly painful to the point she can no longer manage it. She reports the pain is intermittent, sharp, spastic in nature. She has been taking oxycodone but this has not helped. She admits burning with urination, urgency and nausea. Denies hematuria, fever, chills, diarrhea/constipation, CP or SOB. Denies pregnancy, vaginal discharge.    Patient was recently hospitalized with sepsis 2/2 kidney stone and underwent L uretal stent placement on 11/06/16. States she has been experiencing some pain for 3 weeks prior to admission which was manageable.  Patient has been taking medication as prescribed and there has been no recent change in medication or diet.  There has been no travel or sick contacts.     ED Course: Given Fentanyl, Zofran x 2, Morphine x4, and Toradol.  Review of Systems:  CONSTITUTIONAL: No fever/chills, fatigue, weakness, weight gain/loss, headache. EYES: No blurry or double vision. ENT: No tinnitus, postnasal drip, redness or soreness of the oropharynx. RESPIRATORY: No cough, dyspnea, wheeze, hemoptysis.  CARDIOVASCULAR: No chest pain, palpitations, syncope, orthopnea,  GASTROINTESTINAL: Positive for LLQ abdominal pain. No  nausea, vomiting, constipation, diarrhea.  No hematemesis, melena or hematochezia. GENITOURINARY: Positive for dysuria, frequency. No hematuria. ENDOCRINE: No polyuria or nocturia. No heat or cold intolerance. HEMATOLOGY: No anemia, bruising, bleeding. INTEGUMENTARY: No rashes, ulcers, lesions. MUSCULOSKELETAL: Positive for left flank pain. No arthritis, gout, dyspnea.   NEUROLOGIC: No numbness, tingling, ataxia, seizure-type activity, weakness. PSYCHIATRIC: No anxiety, depression, insomnia.   Past Medical History:  Diagnosis Date  . ADHD (attention deficit hyperactivity disorder)   . History of stomach ulcers   . Polycystic disease, ovaries   . Ulcerative colitis [facility])     Past Surgical History:  Procedure Laterality Date  . CYSTOSCOPY WITH STENT PLACEMENT Left 11/06/2016   Procedure: CYSTOSCOPY WITH STENT PLACEMENT;  Surgeon: Nickie Retort, MD;  Location: [facility] ORS;  Service: Urology;  Laterality: Left;  . Surgery for polycystic ovary       reports that she has been smoking Cigarettes.  She has been smoking about 1.00 pack per day. She has never used smokeless tobacco. She reports that she drinks about 2.4 oz of alcohol per week . She reports that she does not use drugs.  Allergies  Allergen Reactions  . Latex Dermatitis    Family History  Problem Relation Age of Onset  . Kidney Stones Father   . Kidney cancer Neg Hx   . Kidney disease Neg Hx   . Prostate cancer Neg Hx    Family history has been reviewed and confirmed with patient.   Prior to Admission medications   Medication Sig Start Date End Date Taking? Authorizing Provider  acyclovir (ZOVIRAX) 800 MG tablet Take 1 tablet (800 mg total) by mouth 2 (two) times daily. 11/02/16   Melody  N Shambley, CNM  amphetamine-dextroamphetamine (ADDERALL) 20 MG tablet Take 1 tablet (20 mg total) by mouth 2 (two) times daily. 09/07/16   Melody N Shambley, CNM  DELZICOL 400 MG CPDR DR capsule Take 800 mg by mouth 3 (three)  times daily. 10/05/16   Historical Provider, MD  escitalopram (LEXAPRO) 20 MG tablet TAKE 1 TABLET (20 MG TOTAL) BY MOUTH DAILY. 09/20/16   Melody N Shambley, CNM  mirabegron ER (MYRBETRIQ) 25 MG TB24 tablet Take 1 tablet (25 mg total) by mouth daily. 11/10/16   Lytle Butte, MD  nicotine (NICODERM CQ - DOSED IN MG/24 HOURS) 14 mg/24hr patch Place 1 patch (14 mg total) onto the skin daily. 11/10/16   Lytle Butte, MD  norgestrel-ethinyl estradiol (LO/OVRAL,CRYSELLE) 0.3-30 MG-MCG tablet Take 1 tablet by mouth daily. 10/31/15   Melody N Shambley, CNM  oxybutynin (DITROPAN) 5 MG tablet Take 1 tablet (5 mg total) by mouth every 8 (eight) hours as needed for bladder spasms. 11/09/16   Lytle Butte, MD  oxyCODONE (OXY IR/ROXICODONE) 5 MG immediate release tablet Take 1 tablet (5 mg total) by mouth every 4 (four) hours as needed for moderate pain. 11/09/16   Lytle Butte, MD  QUEtiapine (SEROQUEL) 200 MG tablet TAKE 1 TABLET (200 MG TOTAL) BY MOUTH AT BEDTIME. 07/20/16   Melody N Shambley, CNM  tamsulosin (FLOMAX) 0.4 MG CAPS capsule Take 1 capsule (0.4 mg total) by mouth daily. 11/10/16   Lytle Butte, MD    Physical Exam: Vitals:   11/26/16 1701 11/26/16 1705  BP:  115/75  Pulse:  86  Resp:  16  Temp:  98.3 F (36.8 C)  TempSrc:  Oral  SpO2:  95%  Weight: 80.3 kg (177 lb)   Height: 5' 4"  (1.626 m)     GENERAL: 36 y.o.-year-old caucasian patient, well-developed, well-nourished lying in the bed in no acute distress.  Pleasant and cooperative.   HEENT: Head atraumatic, normocephalic. Pupils equal, round, reactive to light and accommodation. No scleral icterus. Extraocular muscles intact. Nares are patent. Oropharynx is clear. Mucus membranes moist. NECK: Supple, full range of motion. No JVD, no bruit heard. No thyroid enlargement, no tenderness, no cervical lymphadenopathy. CHEST: Normal breath sounds bilaterally. No wheezing, rales, rhonchi or crackles. No use of accessory muscles of  respiration.  No reproducible chest wall tenderness.  CARDIOVASCULAR: S1, S2 normal. No murmurs, rubs, or gallops. Cap refill <2 seconds. Pulses intact distally.  ABDOMEN: Soft, nondistended, nontender. No rebound, guarding, rigidity. Normoactive bowel sounds present in all four quadrants. No organomegaly or mass. Left CVA tenderness.  EXTREMITIES: No pedal edema, cyanosis, or clubbing. NEUROLOGIC: Cranial nerves II through XII are grossly intact with no focal sensorimotor deficit. Muscle strength 5/5 in all extremities. Sensation intact. Gait not checked. PSYCHIATRIC: The patient is alert and oriented x 3. Normal affect, mood, thought content. SKIN: Warm, dry, and intact without obvious rash, lesion, or ulcer.   Labs on Admission: I have personally reviewed following labs and imaging studies  CBC:  Recent Labs Lab 11/26/16 1712  WBC 7.2  HGB 13.0  HCT 37.7  MCV 93.4  PLT 426   Basic Metabolic Panel:  Recent Labs Lab 11/26/16 1712  NA 136  K 4.0  CL 107  CO2 23  GLUCOSE 95  BUN 11  CREATININE 0.81  CALCIUM 8.5*   GFR: Estimated Creatinine Clearance: 99.3 mL/min (by C-G formula based on SCr of 0.81 mg/dL). Liver Function Tests:  Recent Labs Lab 11/26/16 1712  AST 21  ALT 15  ALKPHOS 40  BILITOT 0.4  PROT 6.4*  ALBUMIN 3.5    Recent Labs Lab 11/26/16 1712  LIPASE 21   No results for input(s): AMMONIA in the last 168 hours. Coagulation Profile: No results for input(s): INR, PROTIME in the last 168 hours. Cardiac Enzymes: No results for input(s): CKTOTAL, CKMB, CKMBINDEX, TROPONINI in the last 168 hours. BNP (last 3 results) No results for input(s): PROBNP in the last 8760 hours. HbA1C: No results for input(s): HGBA1C in the last 72 hours. CBG: No results for input(s): GLUCAP in the last 168 hours. Lipid Profile: No results for input(s): CHOL, HDL, LDLCALC, TRIG, CHOLHDL, LDLDIRECT in the last 72 hours. Thyroid Function Tests: No results for input(s):  TSH, T4TOTAL, FREET4, T3FREE, THYROIDAB in the last 72 hours. Anemia Panel: No results for input(s): VITAMINB12, FOLATE, FERRITIN, TIBC, IRON, RETICCTPCT in the last 72 hours. Urine analysis:    Component Value Date/Time   COLORURINE AMBER (A) 11/26/2016 1950   APPEARANCEUR CLEAR (A) 11/26/2016 1950   LABSPEC 1.004 (L) 11/26/2016 1950   PHURINE 7.0 11/26/2016 1950   GLUCOSEU NEGATIVE 11/26/2016 1950   HGBUR SMALL (A) 11/26/2016 1950   BILIRUBINUR NEGATIVE 11/26/2016 1950   BILIRUBINUR small 02/09/2016 [place] 11/26/2016 1950   PROTEINUR NEGATIVE 11/26/2016 1950   UROBILINOGEN 0.2 02/09/2016 1548   NITRITE POSITIVE (A) 11/26/2016 1950   LEUKOCYTESUR TRACE (A) 11/26/2016 1950   Sepsis Labs: @LABRCNTIP (procalcitonin:4,lacticidven:4) )No results found for this or any previous visit (from the past 240 hour(s)).   Radiological Exams on Admission: Ct Renal Stone Study  Result Date: 11/26/2016 CLINICAL DATA:  Recent ureteral calculus with stent placement on the left. Persistent left flank pain EXAM: CT ABDOMEN AND PELVIS WITHOUT CONTRAST TECHNIQUE: Multidetector CT imaging of the abdomen and pelvis was performed following the standard protocol without oral or intravenous contrast material administration. COMPARISON:  November 06, 2016 FINDINGS: Lower chest: Lung bases are clear. Hepatobiliary: No focal liver lesions are evident on this noncontrast enhanced study. Gallbladder wall is not appreciably thickened. There is no biliary duct dilatation. Pancreas: There is no pancreatic mass or inflammatory focus. Spleen: Spleen measures 14.4 x 9.6 x 6.5 cm with a measured splenic volume of 449 cubic cm. No focal splenic lesions are evident. Adrenals/Urinary Tract: Adrenals appear normal bilaterally. Left kidney is subtly edematous. There is no renal mass on either side. There is no hydronephrosis on the right. There is mild hydronephrosis on the right. A double-J stent extends from the  left renal pelvis to the urinary bladder. There is a 1 mm calculus in the upper pole of the left kidney. There is no demonstrable calculus on the right. No ureteral calculus is evident on either side. Note that the double-J stent could obscure a small ureteral calculus. Urinary bladder is midline with wall thickness within normal limits. Stomach/Bowel: There are occasional sigmoid diverticula without diverticulitis. There is no bowel wall or mesenteric thickening. No evident bowel obstruction. No free air or portal venous air. Vascular/Lymphatic: There is no abdominal aortic aneurysm. No vascular lesions are evident on this noncontrast enhanced study. There is no appreciable adenopathy in abdomen or pelvis. Reproductive: Uterus is anteverted. There is no pelvic mass or pelvic fluid collection. Other: Appendix appears normal. There is no ascites or abscess in the abdomen or pelvis. There is soft tissue stranding along the course the left ureter extending from the left renal pelvis to the bladder. No abnormal fluid collection is seen  in this area. Musculoskeletal: There are no blastic or lytic bone lesions. There is no intramuscular or abdominal wall lesion. IMPRESSION: Persistent mild hydronephrosis on the left with double-J stent on the left extending from the renal pelvis to the bladder. There is soft tissue stranding along the course of the left ureter without abnormal fluid or abscess. There is a 1 mm calculus in the upper pole of the left kidney. Left kidney is subtly edematous. No ureteral calculi are evident on either side. It must be cautioned that a small left ureteral calculus could be obscured by the double-J stent. Urinary bladder wall thickness is normal. There are occasional sigmoid diverticula without diverticulitis. No bowel obstruction or bowel wall thickening. No abscess. Appendix appears normal. Prominent spleen without focal splenic lesion evident. Electronically Signed   By: Lowella Grip III  M.D.   On: 11/26/2016 21:47     Assessment/Plan Active Problems:   Intractable pain    This is a 36 y.o. female with a history of UC, PCOS, kidney stones, L Hydronephrosis, L uretal stent placement and frequent UTI now being admitted with:  Intractable Pain secondary to ureteral stent spasm, nephrolithiasis with hydronephrosis and stent placement, left sided - Admit to inpatient - Acetaminophen, Morphine, Dilaudid for pain - Zofran for nausea - NPO for possible stent removal in AM.  - IVF, Myrbetriq, Flomax - BMP, CBC in AM - Monitor I/O, urine strainer - GU Dr. Erlene Quan was consulted by ED Physician who will see patient in AM.  - Given dysuria, urgency and pain will cover with Rocephin and follow up urine cultures.    H/O Ulcerative Colitis - Continue Mesalamine   Admission status: Inpatient IV Fluids: NS @100  mL/hr Diet/Nutrition: NPO Consults called: Urology  DVT Px: SCDs and early ambulation Code Status: Full Code  Disposition Plan: To home in 1-2 days   All the records are reviewed and case discussed with ED provider. Management plans discussed with the patient and/or family who express understanding and agree with plan of care.  Eulon Allnutt D.O. on 11/26/2016 at 11:00 PM Between 7am to 6pm - Pager - [phone] After 6pm go to [email or web address] - [employer] [facility] Office [phone] CC: Primary care physician; Melody Rockney Ghee, CNM  Harvie Bridge MD [facility] Pager [phone]   If 7PM-7AM, please contact night-coverage [email or web address] Password [email or web address]  11/26/2016, 11:00 PM

## 2016-11-26 NOTE — ED Notes (Signed)
Patient transported to CT 

## 2016-11-26 NOTE — ED Provider Notes (Signed)
[facility] Emergency Department Provider Note   ____________________________________________   First MD Initiated Contact with Patient 11/26/16 1934     (approximate)  I have reviewed the triage vital signs and the nursing notes.   HISTORY  Chief Complaint Flank Pain    HPI Tracy Whitaker is a 36 y.o. female history recent kidney stone with ureteral stent placement, and sepsis.  Patient reports last 3 weeks she's had a persistent pain in the left flank, and the last 2-3 days and seems to be worsening sharp and spastic in nature. She's been taking oxycodone, oxybutynin, and multiple medications including Azo as well with no relief of her pain is becoming more severe to the point that she cannot control her pain and on home.  Denies pregnancy. No fevers or chills. Does report nausea but no vomiting. No chest pain or shortness of breath. No fever. 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   Past Medical History:  Diagnosis Date  . ADHD (attention deficit hyperactivity disorder)   . History of stomach ulcers   . Polycystic disease, ovaries   . Ulcerative colitis [facility])     Patient Active Problem List   Diagnosis Date Noted  . Intractable pain 11/26/2016  . Sepsis ([place]) 11/07/2016  . Hydronephrosis, left 11/06/2016  . ADHD (attention deficit hyperactivity disorder) 04/16/2016  . Obesity (BMI 30-39.9) 04/16/2016    Past Surgical History:  Procedure Laterality Date  . CYSTOSCOPY WITH STENT PLACEMENT Left 11/06/2016   Procedure: CYSTOSCOPY WITH STENT PLACEMENT;  Surgeon: Nickie Retort, MD;  Location: [facility] ORS;  Service: Urology;  Laterality: Left;  . Surgery for polycystic ovary      Prior to Admission medications   Medication Sig Start Date End Date Taking? Authorizing Provider  acyclovir (ZOVIRAX) 800 MG tablet Take 1 tablet (800 mg total) by  mouth 2 (two) times daily. 11/02/16   Melody N Shambley, CNM  amphetamine-dextroamphetamine (ADDERALL) 20 MG tablet Take 1 tablet (20 mg total) by mouth 2 (two) times daily. 09/07/16   Melody N Shambley, CNM  DELZICOL 400 MG CPDR DR capsule Take 800 mg by mouth 3 (three) times daily. 10/05/16   Historical Provider, MD  escitalopram (LEXAPRO) 20 MG tablet TAKE 1 TABLET (20 MG TOTAL) BY MOUTH DAILY. 09/20/16   Melody N Shambley, CNM  mirabegron ER (MYRBETRIQ) 25 MG TB24 tablet Take 1 tablet (25 mg total) by mouth daily. 11/10/16   Lytle Butte, MD  nicotine (NICODERM CQ - DOSED IN MG/24 HOURS) 14 mg/24hr patch Place 1 patch (14 mg total) onto the skin daily. 11/10/16   Lytle Butte, MD  norgestrel-ethinyl estradiol (LO/OVRAL,CRYSELLE) 0.3-30 MG-MCG tablet Take 1 tablet by mouth daily. 10/31/15   Melody N Shambley, CNM  oxybutynin (DITROPAN) 5 MG tablet Take 1 tablet (5 mg total) by mouth every 8 (eight) hours as needed for bladder spasms. 11/09/16   Lytle Butte, MD  oxyCODONE (OXY IR/ROXICODONE) 5 MG immediate release tablet Take 1 tablet (5 mg total) by mouth every 4 (four) hours as needed for moderate pain. 11/09/16   Lytle Butte, MD  QUEtiapine (SEROQUEL) 200 MG tablet TAKE 1 TABLET (200 MG TOTAL) BY MOUTH AT BEDTIME. 07/20/16   Melody N Shambley, CNM  tamsulosin (FLOMAX) 0.4 MG CAPS capsule Take 1 capsule (0.4 mg total) by mouth daily. 11/10/16   Lytle Butte, MD    Allergies  Latex  Family History  Problem Relation Age of Onset  . Kidney Stones Father   . Kidney cancer Neg Hx   . Kidney disease Neg Hx   . Prostate cancer Neg Hx     Social History Social History  Substance Use Topics  . Smoking status: Current Every Day Smoker    Packs/day: 1.00    Types: Cigarettes  . Smokeless tobacco: Never Used  . Alcohol use 2.4 oz/week    4 Standard drinks or equivalent per week    Review of Systems Constitutional: No fever/chills Eyes: No visual changes. ENT: No sore  throat. Cardiovascular: Denies chest pain. Respiratory: Denies shortness of breath. Gastrointestinal: No diarrhea.  No constipation. Genitourinary: Burning and discomfort with urination, better than a month ago but still and discomfort herbal feeling. No vaginal discharge or discomfort Musculoskeletal: Negative for back pain except for left flank. Skin: Negative for rash. Neurological: Negative for headaches, focal weakness or numbness.  10-point ROS otherwise negative.  ____________________________________________   PHYSICAL EXAM:  VITAL SIGNS: ED Triage Vitals  Enc Vitals Group     BP 11/26/16 1705 115/75     Pulse Rate 11/26/16 1705 86     Resp 11/26/16 1705 16     Temp 11/26/16 1705 98.3 F (36.8 C)     Temp Source 11/26/16 1705 Oral     SpO2 11/26/16 1705 95 %     Weight 11/26/16 1701 177 lb (80.3 kg)     Height 11/26/16 1701 5' 4"  (1.626 m)     Head Circumference --      Peak Flow --      Pain Score 11/26/16 1701 10     Pain Loc --      Pain Edu? --      Excl. in [place]? --     Constitutional: Alert and oriented. Well appearing and in no acute distress. Eyes: Conjunctivae are normal. PERRL. EOMI. Head: Atraumatic. Nose: No congestion/rhinnorhea. Mouth/Throat: Mucous membranes are Slightly dry.  Oropharynx non-erythematous. Neck: No stridor.   Cardiovascular: Normal rate, regular rhythm. Grossly normal heart sounds.  Good peripheral circulation. Respiratory: Normal respiratory effort.  No retractions. Lungs CTAB. Gastrointestinal: Soft and nontender. Moderate to severe left-sided CVA tenderness. No tenderness in the lower abdomen bilaterally. No rebound or guarding No distention.  Musculoskeletal: No lower extremity tenderness nor edema.  No joint effusions. Neurologic:  Normal speech and language. No gross focal neurologic deficits are appreciated. No gait instability. Skin:  Skin is warm, dry and intact. No rash noted. Psychiatric: Mood and affect are normal. Speech  and behavior are normal.  ____________________________________________   LABS (all labs ordered are listed, but only abnormal results are displayed)  Labs Reviewed  URINALYSIS, COMPLETE (UACMP) WITH MICROSCOPIC - Abnormal; Notable for the following:       Result Value   Color, Urine AMBER (*)    APPearance CLEAR (*)    Specific Gravity, Urine 1.004 (*)    Hgb urine dipstick SMALL (*)    Nitrite POSITIVE (*)    Leukocytes, UA TRACE (*)    Bacteria, UA RARE (*)    Squamous Epithelial / LPF 0-5 (*)    All other components within normal limits  BASIC METABOLIC PANEL - Abnormal; Notable for the following:    Calcium 8.5 (*)    All other components within normal limits  HEPATIC FUNCTION PANEL - Abnormal; Notable for the following:    Total Protein 6.4 (*)    Bilirubin, Direct <0.1 (*)  All other components within normal limits  URINE CULTURE  CBC  LIPASE, BLOOD  HCG, QUANTITATIVE, PREGNANCY   ____________________________________________  EKG   ____________________________________________  RADIOLOGY  Ct Renal Stone Study  Result Date: 11/26/2016 CLINICAL DATA:  Recent ureteral calculus with stent placement on the left. Persistent left flank pain EXAM: CT ABDOMEN AND PELVIS WITHOUT CONTRAST TECHNIQUE: Multidetector CT imaging of the abdomen and pelvis was performed following the standard protocol without oral or intravenous contrast material administration. COMPARISON:  November 06, 2016 FINDINGS: Lower chest: Lung bases are clear. Hepatobiliary: No focal liver lesions are evident on this noncontrast enhanced study. Gallbladder wall is not appreciably thickened. There is no biliary duct dilatation. Pancreas: There is no pancreatic mass or inflammatory focus. Spleen: Spleen measures 14.4 x 9.6 x 6.5 cm with a measured splenic volume of 449 cubic cm. No focal splenic lesions are evident. Adrenals/Urinary Tract: Adrenals appear normal bilaterally. Left kidney is subtly edematous.  There is no renal mass on either side. There is no hydronephrosis on the right. There is mild hydronephrosis on the right. A double-J stent extends from the left renal pelvis to the urinary bladder. There is a 1 mm calculus in the upper pole of the left kidney. There is no demonstrable calculus on the right. No ureteral calculus is evident on either side. Note that the double-J stent could obscure a small ureteral calculus. Urinary bladder is midline with wall thickness within normal limits. Stomach/Bowel: There are occasional sigmoid diverticula without diverticulitis. There is no bowel wall or mesenteric thickening. No evident bowel obstruction. No free air or portal venous air. Vascular/Lymphatic: There is no abdominal aortic aneurysm. No vascular lesions are evident on this noncontrast enhanced study. There is no appreciable adenopathy in abdomen or pelvis. Reproductive: Uterus is anteverted. There is no pelvic mass or pelvic fluid collection. Other: Appendix appears normal. There is no ascites or abscess in the abdomen or pelvis. There is soft tissue stranding along the course the left ureter extending from the left renal pelvis to the bladder. No abnormal fluid collection is seen in this area. Musculoskeletal: There are no blastic or lytic bone lesions. There is no intramuscular or abdominal wall lesion. IMPRESSION: Persistent mild hydronephrosis on the left with double-J stent on the left extending from the renal pelvis to the bladder. There is soft tissue stranding along the course of the left ureter without abnormal fluid or abscess. There is a 1 mm calculus in the upper pole of the left kidney. Left kidney is subtly edematous. No ureteral calculi are evident on either side. It must be cautioned that a small left ureteral calculus could be obscured by the double-J stent. Urinary bladder wall thickness is normal. There are occasional sigmoid diverticula without diverticulitis. No bowel obstruction or bowel  wall thickening. No abscess. Appendix appears normal. Prominent spleen without focal splenic lesion evident. Electronically Signed   By: Lowella Grip III M.D.   On: 11/26/2016 21:47    ____________________________________________   PROCEDURES  Procedure(s) performed: None  Procedures  Critical Care performed: No  ____________________________________________   INITIAL IMPRESSION / ASSESSMENT AND PLAN / ED COURSE  Pertinent labs & imaging results that were available during my care of the patient were reviewed by me and considered in my medical decision making (see chart for details).  Differential diagnosis includes but is not limited to, abdominal perforation, aortic dissection, cholecystitis, appendicitis, diverticulitis, colitis, esophagitis/gastritis, kidney stone, pyelonephritis, urinary tract infection, aortic aneurysm. All are considered in decision and treatment  plan. Based upon the patient's presentation and risk factors, concerned about potential, [place] or spasm about the left ureter and stent region as the primary cause of pain. She does not have any infectious symptoms, labs are reassuring with no elevated white count, no fever, and her urinalysis appears quite clean considering she has a ureteral stent in place. I think this argues against sepsis or acute urinary tract infection/pyelonephritis  CT scan reassuring, carefully discussed evaluation labs and presentation with Dr. Hollice Espy of urology who advises trial of Toradol, and if thereafter ongoing pain and intractable pain she would evaluate the patient for admission under her service. The patient strongly requests to be admitted due to the intractable nature of her pain, though it has improved somewhat with multiple doses of morphine she does appear uncomfortable and continuing to be in moderate discomfort.   Clinical Course    ----------------------------------------- 11:02 PM on  11/26/2016 ----------------------------------------- Patient continues to note 8 out of 10 pain despite a total of 12 mg of morphine and 30 of Toradol. Does appear to continue having at least modest pain  Patient being admitted, discussed case with Dr. Erlene Quan who requested admission to the internal medicine service for management of the patient's ongoing pain and urology consult in the morning. Patient agreeable with admission, discussed admission and will be admitted under medicine service. Discussed with Dr. Karmen Bongo.   ____________________________________________   FINAL CLINICAL IMPRESSION(S) / ED DIAGNOSES  Final diagnoses:  Acute left flank pain  Intractable pain      NEW MEDICATIONS STARTED DURING THIS VISIT:  New Prescriptions   No medications on file     Note:  This document was prepared using Dragon voice recognition software and may include unintentional dictation errors.     Delman Kitten, MD 11/26/16 2303

## 2016-11-26 NOTE — Telephone Encounter (Signed)
Pt called crying stating she is in severe pain and doesn't know what to do. Pt stated that her stomach is distended and the pain is a 10/10. Reinforced with pt to take myrbetriq, take pain medication, apply heat to the area and drink plenty of fluids. Pt stated that she has been doing all of these without any relief. Reinforced with pt is pain medication is not helping the pain then nor are the other measurements then she should seek help at the ER. Pt voiced understanding stating she really needs help now.

## 2016-11-26 NOTE — ED Notes (Signed)
Pt moved to room 7 from cpod.  Report off to shannon rn.

## 2016-11-26 NOTE — ED Notes (Signed)
Admitting MD at bedside.

## 2016-11-26 NOTE — ED Notes (Signed)
Pt given graham crackers and peanut butter with water.

## 2016-11-26 NOTE — ED Triage Notes (Signed)
Patient states that she is having pain in the left flank area, patient also states that she is having problems with urination, patient states that she was diagnosed with a kidney stone about 2 weeks ago and had a stent put in.  Patient states that she has had pain for the past 2 weeks for the pain got worse today.

## 2016-11-26 NOTE — ED Notes (Signed)
Pt reports he has left flank pain.  Pt states she has stent and kidney stones.  Pt reports pain is worse today.  Iv in place.

## 2016-11-27 ENCOUNTER — Encounter: Payer: Self-pay | Admitting: Anesthesiology

## 2016-11-27 ENCOUNTER — Inpatient Hospital Stay: Payer: BLUE CROSS/BLUE SHIELD | Admitting: Anesthesiology

## 2016-11-27 ENCOUNTER — Encounter
Admission: EM | Disposition: A | Discharge status: Home / Self Care | Payer: Self-pay | Source: Home / Self Care | Attending: Internal Medicine

## 2016-11-27 ENCOUNTER — Telehealth: Payer: Self-pay | Admitting: Urology

## 2016-11-27 DIAGNOSIS — N201 Calculus of ureter: Secondary | ICD-10-CM

## 2016-11-27 DIAGNOSIS — R109 Unspecified abdominal pain: Secondary | ICD-10-CM

## 2016-11-27 HISTORY — PX: CYSTOSCOPY/URETEROSCOPY/HOLMIUM LASER/STENT PLACEMENT: SHX6546

## 2016-11-27 LAB — CBC
HCT: 33.1 % — ABNORMAL LOW (ref 35.0–47.0)
Hemoglobin: 11.3 g/dL — ABNORMAL LOW (ref 12.0–16.0)
MCH: 32.2 pg (ref 26.0–34.0)
MCHC: 34.1 g/dL (ref 32.0–36.0)
MCV: 94.5 fL (ref 80.0–100.0)
Platelets: 204 K/uL (ref 150–440)
RBC: 3.5 MIL/uL — ABNORMAL LOW (ref 3.80–5.20)
RDW: 13.6 % (ref 11.5–14.5)
WBC: 5.5 K/uL (ref 3.6–11.0)

## 2016-11-27 LAB — BASIC METABOLIC PANEL WITH GFR
Anion gap: 3 — ABNORMAL LOW (ref 5–15)
BUN: 9 mg/dL (ref 6–20)
CO2: 25 mmol/L (ref 22–32)
Calcium: 8.3 mg/dL — ABNORMAL LOW (ref 8.9–10.3)
Chloride: 109 mmol/L (ref 101–111)
Creatinine, Ser: 0.66 mg/dL (ref 0.44–1.00)
GFR calc Af Amer: >60 mL/min
GFR calc non Af Amer: >60 mL/min
Glucose, Bld: 89 mg/dL (ref 65–99)
Potassium: 3.8 mmol/L (ref 3.5–5.1)
Sodium: 137 mmol/L (ref 135–145)

## 2016-11-27 SURGERY — CYSTOSCOPY/URETEROSCOPY/HOLMIUM LASER/STENT PLACEMENT
Anesthesia: General | Laterality: Left | Wound class: Contaminated

## 2016-11-27 MED ORDER — ACYCLOVIR 200 MG PO CAPS
800.0000 mg | ORAL_CAPSULE | Freq: Two times a day (BID) | ORAL | Status: DC
  Administered 2016-11-27 – 2016-11-28 (×2): 800 mg via ORAL
  Filled 2016-11-27: qty 4

## 2016-11-27 MED ORDER — MAGNESIUM CITRATE PO SOLN: 1.0000 | Freq: Once | ORAL | Status: DC | PRN

## 2016-11-27 MED ORDER — OXYBUTYNIN CHLORIDE 5 MG PO TABS: 5.0000 mg | ORAL_TABLET | Freq: Three times a day (TID) | ORAL | Status: DC | PRN

## 2016-11-27 MED ORDER — ALBUTEROL SULFATE (2.5 MG/3ML) 0.083% IN NEBU: 2.5000 mg | INHALATION_SOLUTION | Freq: Four times a day (QID) | RESPIRATORY_TRACT | Status: DC | PRN

## 2016-11-27 MED ORDER — ONDANSETRON HCL 4 MG/2ML IJ SOLN: 4.0000 mg | Freq: Once | INTRAMUSCULAR | Status: DC | PRN

## 2016-11-27 MED ORDER — CEFTRIAXONE SODIUM-DEXTROSE 1-3.74 GM-% IV SOLR
1.0000 g | Freq: Every day | INTRAVENOUS | Status: DC
  Administered 2016-11-27 (×2): 1 g via INTRAVENOUS
  Filled 2016-11-27 (×3): qty 50

## 2016-11-27 MED ORDER — FENTANYL CITRATE (PF) 100 MCG/2ML IJ SOLN
INTRAMUSCULAR | Status: DC | PRN
  Administered 2016-11-27 (×2): 50 ug via INTRAVENOUS

## 2016-11-27 MED ORDER — LACTATED RINGERS IV SOLN
INTRAVENOUS | Status: DC | PRN
  Administered 2016-11-27: 15:00:00 via INTRAVENOUS

## 2016-11-27 MED ORDER — MIRABEGRON ER 25 MG PO TB24
25.0000 mg | ORAL_TABLET | Freq: Every day | ORAL | Status: DC
  Administered 2016-11-28: 25 mg via ORAL
  Filled 2016-11-27 (×2): qty 1

## 2016-11-27 MED ORDER — ACETAMINOPHEN 650 MG RE SUPP: 650.0000 mg | Freq: Four times a day (QID) | RECTAL | Status: DC | PRN

## 2016-11-27 MED ORDER — MORPHINE SULFATE (PF) 4 MG/ML IV SOLN
2.0000 mg | INTRAVENOUS | Status: DC | PRN
Start: 2016-11-27 — End: 2016-11-28
  Administered 2016-11-27: 2 mg via INTRAVENOUS
  Filled 2016-11-27: qty 1

## 2016-11-27 MED ORDER — ACETAMINOPHEN 325 MG PO TABS: 650.0000 mg | ORAL_TABLET | Freq: Four times a day (QID) | ORAL | Status: DC | PRN

## 2016-11-27 MED ORDER — ESCITALOPRAM OXALATE 10 MG PO TABS
20.0000 mg | ORAL_TABLET | Freq: Every day | ORAL | Status: DC
  Administered 2016-11-28: 20 mg via ORAL
  Filled 2016-11-27: qty 2

## 2016-11-27 MED ORDER — MESALAMINE 400 MG PO CPDR
800.0000 mg | DELAYED_RELEASE_CAPSULE | Freq: Three times a day (TID) | ORAL | Status: DC
  Administered 2016-11-27 – 2016-11-28 (×2): 800 mg via ORAL
  Filled 2016-11-27 (×5): qty 2

## 2016-11-27 MED ORDER — OXYCODONE-ACETAMINOPHEN 5-325 MG PO TABS
1.0000 | ORAL_TABLET | ORAL | Status: DC | PRN
  Administered 2016-11-27: 1 via ORAL
  Administered 2016-11-28 (×2): 2 via ORAL
  Filled 2016-11-27 (×2): qty 2
  Filled 2016-11-27: qty 1

## 2016-11-27 MED ORDER — NICOTINE 14 MG/24HR TD PT24
14.0000 mg | MEDICATED_PATCH | Freq: Every day | TRANSDERMAL | Status: DC
  Administered 2016-11-27: 14 mg via TRANSDERMAL
  Filled 2016-11-27: qty 1

## 2016-11-27 MED ORDER — ONDANSETRON HCL 4 MG/2ML IJ SOLN
INTRAMUSCULAR | Status: DC | PRN
  Administered 2016-11-27: 4 mg via INTRAVENOUS

## 2016-11-27 MED ORDER — OXYCODONE-ACETAMINOPHEN 5-325 MG PO TABS: 1.0000 | ORAL_TABLET | ORAL | 0 refills | Status: DC | PRN

## 2016-11-27 MED ORDER — SENNOSIDES-DOCUSATE SODIUM 8.6-50 MG PO TABS: 1.0000 | ORAL_TABLET | Freq: Every evening | ORAL | Status: DC | PRN

## 2016-11-27 MED ORDER — FENTANYL CITRATE (PF) 100 MCG/2ML IJ SOLN
INTRAMUSCULAR | Status: AC
  Administered 2016-11-27: 25 ug via INTRAVENOUS
  Filled 2016-11-27: qty 2

## 2016-11-27 MED ORDER — BISACODYL 5 MG PO TBEC: 5.0000 mg | DELAYED_RELEASE_TABLET | Freq: Every day | ORAL | Status: DC | PRN

## 2016-11-27 MED ORDER — ONDANSETRON HCL 4 MG/2ML IJ SOLN: 4.0000 mg | Freq: Four times a day (QID) | INTRAMUSCULAR | Status: DC | PRN

## 2016-11-27 MED ORDER — FENTANYL CITRATE (PF) 100 MCG/2ML IJ SOLN
25.0000 ug | INTRAMUSCULAR | Status: DC | PRN
  Administered 2016-11-27 (×2): 25 ug via INTRAVENOUS

## 2016-11-27 MED ORDER — SODIUM CHLORIDE 0.9 % IV SOLN
INTRAVENOUS | Status: DC
  Administered 2016-11-27 (×2): via INTRAVENOUS

## 2016-11-27 MED ORDER — RISAQUAD PO CAPS
2.0000 | ORAL_CAPSULE | Freq: Three times a day (TID) | ORAL | Status: DC
  Administered 2016-11-27 – 2016-11-28 (×2): 2 via ORAL
  Filled 2016-11-27 (×2): qty 2

## 2016-11-27 MED ORDER — ONDANSETRON HCL 4 MG PO TABS: 4.0000 mg | ORAL_TABLET | Freq: Four times a day (QID) | ORAL | Status: DC | PRN

## 2016-11-27 MED ORDER — QUETIAPINE FUMARATE 100 MG PO TABS
200.0000 mg | ORAL_TABLET | Freq: Every day | ORAL | Status: DC
  Administered 2016-11-27 (×2): 200 mg via ORAL
  Filled 2016-11-27 (×2): qty 2

## 2016-11-27 MED ORDER — MIDAZOLAM HCL 2 MG/2ML IJ SOLN
INTRAMUSCULAR | Status: DC | PRN
  Administered 2016-11-27: 2 mg via INTRAVENOUS

## 2016-11-27 MED ORDER — ACYCLOVIR 800 MG PO TABS
800.0000 mg | ORAL_TABLET | Freq: Two times a day (BID) | ORAL | Status: DC
  Filled 2016-11-27 (×2): qty 1

## 2016-11-27 MED ORDER — PROPOFOL 10 MG/ML IV BOLUS
INTRAVENOUS | Status: DC | PRN
  Administered 2016-11-27: 120 mg via INTRAVENOUS

## 2016-11-27 MED ORDER — FENTANYL CITRATE (PF) 100 MCG/2ML IJ SOLN
INTRAMUSCULAR | Status: AC
  Filled 2016-11-27: qty 2

## 2016-11-27 MED ORDER — IOTHALAMATE MEGLUMINE 43 % IV SOLN
INTRAVENOUS | Status: DC | PRN
  Administered 2016-11-27: 15 mL via URETHRAL

## 2016-11-27 MED ORDER — AMPHETAMINE-DEXTROAMPHETAMINE 5 MG PO TABS: 20.0000 mg | ORAL_TABLET | Freq: Two times a day (BID) | ORAL | Status: DC

## 2016-11-27 MED ORDER — NORGESTREL-ETHINYL ESTRADIOL 0.3-30 MG-MCG PO TABS: 1.0000 | ORAL_TABLET | Freq: Every day | ORAL | Status: DC

## 2016-11-27 MED ORDER — MIDAZOLAM HCL 2 MG/2ML IJ SOLN
INTRAMUSCULAR | Status: AC
  Filled 2016-11-27: qty 2

## 2016-11-27 MED ORDER — TAMSULOSIN HCL 0.4 MG PO CAPS: 0.4000 mg | ORAL_CAPSULE | Freq: Every day | ORAL | Status: DC

## 2016-11-27 SURGICAL SUPPLY — 30 items
BAG DRAIN CYSTO-URO LG1000N (MISCELLANEOUS) ×2 IMPLANT
BASKET ZERO TIP 1.9FR (BASKET) ×2 IMPLANT
BRUSH SCRUB EZ 1% IODOPHOR (MISCELLANEOUS) ×2 IMPLANT
CATH URETL 5X70 OPEN END (CATHETERS) ×2 IMPLANT
CNTNR SPEC 2.5X3XGRAD LEK (MISCELLANEOUS) ×1
CONRAY 43 FOR UROLOGY 50M (MISCELLANEOUS) ×2 IMPLANT
CONT SPEC 4OZ STER OR WHT (MISCELLANEOUS) ×1
CONTAINER SPEC 2.5X3XGRAD LEK (MISCELLANEOUS) ×1 IMPLANT
DRAPE UTILITY 15X26 TOWEL STRL (DRAPES) ×2 IMPLANT
FIBER LASER LITHO 273 (Laser) IMPLANT
GLOVE BIO SURGEON STRL SZ 6.5 (GLOVE) IMPLANT
GLOVE SKINSENSE NS SZ6.5 (GLOVE) ×3
GLOVE SKINSENSE STRL SZ6.5 (GLOVE) ×3 IMPLANT
GOWN STRL REUS W/ TWL LRG LVL3 (GOWN DISPOSABLE) ×2 IMPLANT
GOWN STRL REUS W/TWL LRG LVL3 (GOWN DISPOSABLE) ×2
GUIDEWIRE SUPER STIFF (WIRE) IMPLANT
INFUSOR MANOMETER BAG 3000ML (MISCELLANEOUS) ×2 IMPLANT
INTRODUCER DILATOR DOUBLE (INTRODUCER) IMPLANT
KIT RM TURNOVER CYSTO AR (KITS) ×2 IMPLANT
PACK CYSTO AR (MISCELLANEOUS) ×2 IMPLANT
SENSORWIRE 0.038 NOT ANGLED (WIRE) ×4
SET CYSTO W/LG BORE CLAMP LF (SET/KITS/TRAYS/PACK) ×2 IMPLANT
SHEATH URETERAL 12FRX35CM (MISCELLANEOUS) IMPLANT
SOL .9 NS 3000ML IRR  AL (IV SOLUTION) ×1
SOL .9 NS 3000ML IRR UROMATIC (IV SOLUTION) ×1 IMPLANT
STENT URET 6FRX24 CONTOUR (STENTS) IMPLANT
STENT URET 6FRX26 CONTOUR (STENTS) IMPLANT
SURGILUBE 2OZ TUBE FLIPTOP (MISCELLANEOUS) ×2 IMPLANT
WATER STERILE IRR 1000ML POUR (IV SOLUTION) ×2 IMPLANT
WIRE SENSOR 0.038 NOT ANGLED (WIRE) ×2 IMPLANT

## 2016-11-27 NOTE — Anesthesia Preprocedure Evaluation (Signed)
Anesthesia Evaluation  Patient identified by MRN, date of birth, ID band Patient awake    Reviewed: Allergy & Precautions, NPO status , Patient's Chart, lab work & pertinent test results, reviewed documented beta blocker date and time   Airway Mallampati: III  TM Distance: >3 FB     Dental  (+) Chipped   Pulmonary Current Smoker,           Cardiovascular      Neuro/Psych    GI/Hepatic   Endo/Other    Renal/GU      Musculoskeletal   Abdominal   Peds  Hematology   Anesthesia Other Findings Obese.  Reproductive/Obstetrics                             Anesthesia Physical Anesthesia Plan  ASA: III  Anesthesia Plan: General   Post-op Pain Management:    Induction: Intravenous  Airway Management Planned: Oral ETT and LMA  Additional Equipment:   Intra-op Plan:   Post-operative Plan:   Informed Consent: I have reviewed the patients History and Physical, chart, labs and discussed the procedure including the risks, benefits and alternatives for the proposed anesthesia with the patient or authorized representative who has indicated his/her understanding and acceptance.     Plan Discussed with: CRNA  Anesthesia Plan Comments:         Anesthesia Quick Evaluation

## 2016-11-27 NOTE — Anesthesia Postprocedure Evaluation (Signed)
Anesthesia Post Note  Patient: Tracy Whitaker  Procedure(s) Performed: Procedure(s) (LRB): CYSTOSCOPY/URETEROSCOPY/HOLMIUM LASER/STENT  rePLACEMENT (Left)  Patient location during evaluation: PACU Anesthesia Type: General Level of consciousness: awake and alert Pain management: pain level controlled Vital Signs Assessment: post-procedure vital signs reviewed and stable Respiratory status: spontaneous breathing, nonlabored ventilation, respiratory function stable and patient connected to nasal cannula oxygen Cardiovascular status: blood pressure returned to baseline and stable Postop Assessment: no signs of nausea or vomiting Anesthetic complications: no     Last Vitals:  Vitals:   11/27/16 1622 11/27/16 1649  BP: 102/75 111/78  Pulse: 75 62  Resp: 18 16  Temp:  36.6 C    Last Pain:  Vitals:   11/27/16 1649  TempSrc: Oral  PainSc:                  Zannie Runkle S

## 2016-11-27 NOTE — Plan of Care (Signed)
Problem: Pain Managment: Goal: General experience of comfort will improve Outcome: Progressing Patient's pain is adequately controlled with PRN medications.

## 2016-11-27 NOTE — Progress Notes (Signed)
PT Cancellation Note  Patient Details Name: Tracy Whitaker MRN: [id number] DOB: January 11, 1981   Cancelled Treatment:    Reason Eval/Treat Not Completed: Other (comment) (Declined due to getting around fine).  Will leave order open as pt is having a procedure today and may need involvement with PT afterward.   Ramond Dial 11/27/2016, 2:25 PM   Mee Hives, PT MS Acute Rehab Dept. Number: [place]

## 2016-11-27 NOTE — Anesthesia Preprocedure Evaluation (Addendum)
Anesthesia Evaluation  Patient identified by MRN, date of birth, ID band Patient awake    Reviewed: Allergy & Precautions, NPO status , Patient's Chart, lab work & pertinent test results, reviewed documented beta blocker date and time   Airway Mallampati: II  TM Distance: >3 FB     Dental  (+) Chipped   Pulmonary Current Smoker,           Cardiovascular      Neuro/Psych    GI/Hepatic   Endo/Other    Renal/GU      Musculoskeletal   Abdominal   Peds  Hematology  (+) anemia ,   Anesthesia Other Findings U-preg done in ER last nite - negative. Pt aware. Hb 11.3.  Reproductive/Obstetrics                            Anesthesia Physical Anesthesia Plan  ASA: II  Anesthesia Plan: General   Post-op Pain Management:    Induction: Intravenous  Airway Management Planned: LMA  Additional Equipment:   Intra-op Plan:   Post-operative Plan:   Informed Consent: I have reviewed the patients History and Physical, chart, labs and discussed the procedure including the risks, benefits and alternatives for the proposed anesthesia with the patient or authorized representative who has indicated his/her understanding and acceptance.     Plan Discussed with: CRNA  Anesthesia Plan Comments:         Anesthesia Quick Evaluation

## 2016-11-27 NOTE — Progress Notes (Signed)
Pharmacy Antibiotic Note  Tracy Whitaker is a 36 y.o. female admitted on 11/26/2016 with UTI.  Pharmacy has been consulted for ceftriaxone dosing.  Plan: Ceftriaxone 1 gm IV Q24H  Height: 5\' 4"  (162.6 cm) Weight: 180 lb 1.6 oz (81.7 kg) IBW/kg (Calculated) : 54.7  Temp (24hrs), Avg:98 F (36.7 C), Min:97.7 F (36.5 C), Max:98.3 F (36.8 C)   Recent Labs Lab 11/26/16 1712  WBC 7.2  CREATININE 0.81    Estimated Creatinine Clearance: 100.2 mL/min (by C-G formula based on SCr of 0.81 mg/dL).    Allergies  Allergen Reactions  . Latex Dermatitis    Thank you for allowing pharmacy to be a part of this patient's care.  Laural Benes, Pharm.D., BCPS Clinical Pharmacist 11/27/2016 12:45 AM

## 2016-11-27 NOTE — Consult Note (Addendum)
Urology Consult  I have been asked to see the patient by Dr. Jacqualine Code, for evaluation and management of left stent colic.  Chief Complaint: left flank pain  History of Present Illness: Tracy Whitaker is a 36 y.o. year old with a history of an obstructing 4 mm left UVJ stone status emergent left ureteral stent placement on 11/06/2016 in the setting of urosepsis.  She was ultimately discharged on antibiotics and has been struggling with stent colic since the time of her discharge. She returned to the emergency room overnight with severe ongoing poorly controlled left flank pain that comes and goes. Her pain has been difficult to control with oral pain medication along with multiple anti-cholinergic medications. She's had to miss all but one day of work since the time of her initial illness.  In the emergency room overnight, she was administered multiple rounds of IV narcotic followed by Toradol without significant improvement in her pain. She was admitted for pain control. This morning, she remains nothing by mouth.  She denies any fevers or chills. She does have dysuria and urinary frequency/urgency related to the stent. She has some associated nausea with severe pain that no vomiting.  She is hemodynamically stable, afebrile, UA negative other than for nitrites but is currently taking Pyridium.  No leukocytosis.  CT scan in the emergency room shows mild left hydronephrosis with some subtle perinephric stranding and the stent in good position. The small stone is not able to be clearly visualized but likely related to size.    Past Medical History:  Diagnosis Date  . ADHD (attention deficit hyperactivity disorder)   . History of stomach ulcers   . Polycystic disease, ovaries   . Ulcerative colitis [facility])     Past Surgical History:  Procedure Laterality Date  . CYSTOSCOPY WITH STENT PLACEMENT Left 11/06/2016   Procedure: CYSTOSCOPY WITH STENT PLACEMENT;  Surgeon: Nickie Retort,  MD;  Location: [facility] ORS;  Service: Urology;  Laterality: Left;  . Surgery for polycystic ovary      Home Medications:  Current Meds  Medication Sig  . acyclovir (ZOVIRAX) 800 MG tablet Take 1 tablet (800 mg total) by mouth 2 (two) times daily.  Marland Kitchen amphetamine-dextroamphetamine (ADDERALL) 20 MG tablet Take 1 tablet (20 mg total) by mouth 2 (two) times daily.  . AZO-CRANBERRY PO Take 1 tablet by mouth.  . DELZICOL 400 MG CPDR DR capsule Take 800 mg by mouth 3 (three) times daily.  Marland Kitchen escitalopram (LEXAPRO) 20 MG tablet TAKE 1 TABLET (20 MG TOTAL) BY MOUTH DAILY.  . mirabegron ER (MYRBETRIQ) 25 MG TB24 tablet Take 1 tablet (25 mg total) by mouth daily.  . nicotine (NICODERM CQ - DOSED IN MG/24 HOURS) 14 mg/24hr patch Place 1 patch (14 mg total) onto the skin daily.  . norgestrel-ethinyl estradiol (LO/OVRAL,CRYSELLE) 0.3-30 MG-MCG tablet Take 1 tablet by mouth daily.  Marland Kitchen oxybutynin (DITROPAN) 5 MG tablet Take 1 tablet (5 mg total) by mouth every 8 (eight) hours as needed for bladder spasms.  Marland Kitchen oxyCODONE (OXY IR/ROXICODONE) 5 MG immediate release tablet Take 1 tablet (5 mg total) by mouth every 4 (four) hours as needed for moderate pain.  Marland Kitchen QUEtiapine (SEROQUEL) 200 MG tablet TAKE 1 TABLET (200 MG TOTAL) BY MOUTH AT BEDTIME.  . tamsulosin (FLOMAX) 0.4 MG CAPS capsule Take 1 capsule (0.4 mg total) by mouth daily.    Allergies:  Allergies  Allergen Reactions  . Latex Dermatitis    Family History  Problem  Relation Age of Onset  . Kidney Stones Father   . Kidney cancer Neg Hx   . Kidney disease Neg Hx   . Prostate cancer Neg Hx     Social History:  reports that she has been smoking Cigarettes.  She has been smoking about 1.00 pack per day. She has never used smokeless tobacco. She reports that she drinks about 2.4 oz of alcohol per week . She reports that she does not use drugs.  ROS: A complete review of systems was performed.  All systems are negative except for pertinent findings as  noted.  Physical Exam:  Vital signs in last 24 hours: Temp:  [97.7 F (36.5 C)-98.3 F (36.8 C)] 97.8 F (36.6 C) (01/06 0448) Pulse Rate:  [60-86] 60 (01/06 0450) Resp:  [16-20] 19 (01/06 0450) BP: (75-115)/(47-76) 98/66 (01/06 0758) SpO2:  [91 %-95 %] 91 % (01/06 0450) Weight:  [177 lb (80.3 kg)-180 lb 1.6 oz (81.7 kg)] 180 lb 1.6 oz (81.7 kg) (01/06 0031) Constitutional:  Alert and oriented, No acute distress HEENT: [place] AT, moist mucus membranes.  Trachea midline, no masses Cardiovascular: Regular rate and rhythm, no clubbing, cyanosis, or edema. Respiratory: Normal respiratory effort, lungs clear bilaterally GI: Abdomen is soft, nontender, nondistended, no abdominal masses GU: No CVA tenderness Skin: No rashes, bruises or suspicious lesions Lymph: No cervical or inguinal adenopathy Neurologic: Grossly intact, no focal deficits, moving all 4 extremities Psychiatric: Normal mood and affect   Laboratory Data:   Recent Labs  11/26/16 1712 11/27/16 0501  WBC 7.2 5.5  HGB 13.0 11.3*  HCT 37.7 33.1*    Recent Labs  11/26/16 1712 11/27/16 0501  NA 136 137  K 4.0 3.8  CL 107 109  CO2 23 25  GLUCOSE 95 89  BUN 11 9  CREATININE 0.81 0.66  CALCIUM 8.5* 8.3*   No results for input(s): LABPT, INR in the last 72 hours. No results for input(s): LABURIN in the last 72 hours. Results for orders placed or performed during the hospital encounter of 11/06/16  Urine culture     Status: None   Collection Time: 11/06/16  3:04 PM  Result Value Ref Range Status   Specimen Description URINE, RANDOM  Final   Special Requests NONE  Final   Culture NO GROWTH Performed at [facility]   Final   Report Status 11/07/2016 FINAL  Final  CULTURE, BLOOD (ROUTINE X 2) w Reflex to ID Panel     Status: None   Collection Time: 11/08/16  8:12 AM  Result Value Ref Range Status   Specimen Description BLOOD LEFT ARM  Final   Special Requests BOTTLES DRAWN AEROBIC AND ANAEROBIC 12CC   Final   Culture NO GROWTH 5 DAYS  Final   Report Status 11/13/2016 FINAL  Final  CULTURE, BLOOD (ROUTINE X 2) w Reflex to ID Panel     Status: None   Collection Time: 11/08/16  8:19 AM  Result Value Ref Range Status   Specimen Description BLOOD RIGHT ARM  Final   Special Requests   Final    BOTTLES DRAWN AEROBIC AND ANAEROBIC AER [place] ANA [place]   Culture NO GROWTH 5 DAYS  Final   Report Status 11/13/2016 FINAL  Final     Component     Latest Ref Rng & Units 11/26/2016  Color, Urine     YELLOW AMBER (A)  Appearance     CLEAR CLEAR (A)  Specific Gravity, Urine     1.005 -  1.030 1.004 (L)  pH     5.0 - 8.0 7.0  Glucose     NEGATIVE mg/dL NEGATIVE  Hgb urine dipstick     NEGATIVE SMALL (A)  Bilirubin Urine     NEGATIVE NEGATIVE  Ketones, ur     NEGATIVE mg/dL NEGATIVE  Protein     NEGATIVE mg/dL NEGATIVE  Nitrite     NEGATIVE POSITIVE (A)  Leukocytes, UA     NEGATIVE TRACE (A)  RBC / HPF     0 - 5 RBC/hpf 0-5  WBC, UA     0 - 5 WBC/hpf 0-5  Bacteria, UA     NONE SEEN RARE (A)  Squamous Epithelial / LPF     NONE SEEN 0-5 (A)  Mucous      PRESENT    Radiologic Imaging: Ct Renal Stone Study  Result Date: 11/26/2016 CLINICAL DATA:  Recent ureteral calculus with stent placement on the left. Persistent left flank pain EXAM: CT ABDOMEN AND PELVIS WITHOUT CONTRAST TECHNIQUE: Multidetector CT imaging of the abdomen and pelvis was performed following the standard protocol without oral or intravenous contrast material administration. COMPARISON:  November 06, 2016 FINDINGS: Lower chest: Lung bases are clear. Hepatobiliary: No focal liver lesions are evident on this noncontrast enhanced study. Gallbladder wall is not appreciably thickened. There is no biliary duct dilatation. Pancreas: There is no pancreatic mass or inflammatory focus. Spleen: Spleen measures 14.4 x 9.6 x 6.5 cm with a measured splenic volume of 449 cubic cm. No focal splenic lesions are evident. Adrenals/Urinary  Tract: Adrenals appear normal bilaterally. Left kidney is subtly edematous. There is no renal mass on either side. There is no hydronephrosis on the right. There is mild hydronephrosis on the right. A double-J stent extends from the left renal pelvis to the urinary bladder. There is a 1 mm calculus in the upper pole of the left kidney. There is no demonstrable calculus on the right. No ureteral calculus is evident on either side. Note that the double-J stent could obscure a small ureteral calculus. Urinary bladder is midline with wall thickness within normal limits. Stomach/Bowel: There are occasional sigmoid diverticula without diverticulitis. There is no bowel wall or mesenteric thickening. No evident bowel obstruction. No free air or portal venous air. Vascular/Lymphatic: There is no abdominal aortic aneurysm. No vascular lesions are evident on this noncontrast enhanced study. There is no appreciable adenopathy in abdomen or pelvis. Reproductive: Uterus is anteverted. There is no pelvic mass or pelvic fluid collection. Other: Appendix appears normal. There is no ascites or abscess in the abdomen or pelvis. There is soft tissue stranding along the course the left ureter extending from the left renal pelvis to the bladder. No abnormal fluid collection is seen in this area. Musculoskeletal: There are no blastic or lytic bone lesions. There is no intramuscular or abdominal wall lesion. IMPRESSION: Persistent mild hydronephrosis on the left with double-J stent on the left extending from the renal pelvis to the bladder. There is soft tissue stranding along the course of the left ureter without abnormal fluid or abscess. There is a 1 mm calculus in the upper pole of the left kidney. Left kidney is subtly edematous. No ureteral calculi are evident on either side. It must be cautioned that a small left ureteral calculus could be obscured by the double-J stent. Urinary bladder wall thickness is normal. There are occasional  sigmoid diverticula without diverticulitis. No bowel obstruction or bowel wall thickening. No abscess. Appendix appears normal. Prominent spleen without  focal splenic lesion evident. Electronically Signed   By: Lowella Grip III M.D.   On: 11/26/2016 21:47   CT scan personally reviewed.  Impression/Assessment:  36 year old female with indwelling left ureteral stent placed for history of septic stone with refractory stent colic requiring admission.  Signs or symptoms of ongoing infection. No other pathology identifiable contributing to her severe pain.  Plan:  -Given her inability to tolerate the stent and absence of infection today, I have recommended proceeding to the operating room for ureteroscopy in order to clear her of her stone burden possibly with stent replacement as deemed necessary intraoperatively for a short period of time following surgery. --Alternative option including stent removal without ureteroscopy was discussed, however, given her history of sepsis and possibility of retained stone, I have advised against this -Risks and benefits of ureteroscopy were reviewed including but not limited to infection, bleeding, pain, ureteral injury which could require open surgery versus prolonged indwelling if ureteralperforation occurs, persistent stone disease, requirement for staged procedure, possible stent, and global anesthesia risks. Patient expressed understanding and desires to proceed with ureteroscopy. -NPO, site marked on left side, consent signed and reviewed -Questions answered  Ms. Supan can likely be discharged this afternoon following her procedure pending her pain control. I'll provide scripts in the chart in preparation for her discharge. She will need to follow-up with [place] in 4 weeks with renal ultrasound following stent removal (will arrange for this). Post op instructions will be provided in her discharge.  11/27/2016, 8:19 AM  Hollice Espy,  MD

## 2016-11-27 NOTE — Telephone Encounter (Signed)
This patient was admitted over the weekend with severe stent pain. I went ahead and did her ureteroscopy and removed her stone (no stent). Please cancel her follow-up with Dr. Pilar Jarvis next week. Her appointment needs to be rescheduled for in 4 weeks with a renal ultrasound prior. Order was placed in this encounter.  Hollice Espy, MD

## 2016-11-27 NOTE — Progress Notes (Signed)
Prescription provided to patient's mother at patient's request so that Mom can drop off at pharmacy this evening pending discharge tomorrow a.m.  Notified pharmacy tech this a.m. of need to complete med reconciliation.  Tech stated he would be up to complete.

## 2016-11-27 NOTE — Anesthesia Procedure Notes (Signed)
Procedure Name: LMA Insertion Date/Time: 11/27/2016 3:10 PM Performed by: [id number], Giankarlo Leamer Pre-anesthesia Checklist: Patient identified, Timeout performed, Patient being monitored, Suction available and Emergency Drugs available Patient Re-evaluated:Patient Re-evaluated prior to inductionOxygen Delivery Method: Circle system utilized Preoxygenation: Pre-oxygenation with 100% oxygen Intubation Type: IV induction LMA: LMA inserted LMA Size: 3.5 Number of attempts: 1

## 2016-11-27 NOTE — Anesthesia Preprocedure Evaluation (Incomplete Revision)
Anesthesia Evaluation  Patient identified by MRN, date of birth, ID band Patient awake    Reviewed: Allergy & Precautions, NPO status , Patient's Chart, lab work & pertinent test results, reviewed documented beta blocker date and time   Airway Mallampati: III  TM Distance: >3 FB     Dental  (+) Chipped   Pulmonary Current Smoker,           Cardiovascular      Neuro/Psych    GI/Hepatic   Endo/Other    Renal/GU      Musculoskeletal   Abdominal   Peds  Hematology   Anesthesia Other Findings Obese.  Reproductive/Obstetrics                             Anesthesia Physical Anesthesia Plan  ASA: III  Anesthesia Plan: General   Post-op Pain Management:    Induction: Intravenous  Airway Management Planned: Oral ETT and LMA  Additional Equipment:   Intra-op Plan:   Post-operative Plan:   Informed Consent: I have reviewed the patients History and Physical, chart, labs and discussed the procedure including the risks, benefits and alternatives for the proposed anesthesia with the patient or authorized representative who has indicated his/her understanding and acceptance.     Plan Discussed with: CRNA  Anesthesia Plan Comments:         Anesthesia Quick Evaluation

## 2016-11-27 NOTE — Transfer of Care (Signed)
Immediate Anesthesia Transfer of Care Note  Patient: Tracy Whitaker  Procedure(s) Performed: Procedure(s): CYSTOSCOPY/URETEROSCOPY/HOLMIUM LASER/STENT  rePLACEMENT (Left)  Patient Location: PACU  Anesthesia Type:General  Level of Consciousness: awake, alert  and oriented  Airway & Oxygen Therapy: Patient Spontanous Breathing  Post-op Assessment: Report given to RN  Post vital signs: Reviewed and stable  Last Vitals:  Vitals:   11/27/16 1252 11/27/16 1543  BP: (!) 92/59 116/80  Pulse: (!) 56 68  Resp: 16 16  Temp: 36.9 C 36.4 C    Last Pain:  Vitals:   11/27/16 1416  TempSrc:   PainSc: 5       Patients Stated Pain Goal: 0 ([id number])  Complications: No apparent anesthesia complications

## 2016-11-27 NOTE — Op Note (Signed)
Date of procedure: 11/27/16  Preoperative diagnosis:  1. Left distal ureteral stone 2. Refractory stent colic   Postoperative diagnosis:  1. Same as above   Procedure: 1. Left ureteroscopy 2. Basket extraction of ureteral calculus 3. Left retrograde pyelogram 4. Left ureteral stent removal  Surgeon: Hollice Espy, MD  Anesthesia: General  Complications: None  Intraoperative findings: 4 mm left distal ureteral stone identified, extracted via basket and block with minimal trauma. Postprocedure retrograde without hydroureteronephrosis, brisk drainage of the left kidney.  EBL: Minimal  Specimens: Stone  Drains: None  Indication: Tracy Whitaker is a 36 y.o. patient with history of a 4 mm left distal ureteral stone status post ureteral stent placement previously in the setting of urosepsis. She was treated adequately for that infection. She returned to the emergency room with severe stent colic requiring admission..  After reviewing the management options for treatment, she elected to proceed with the above surgical procedure(s). We have discussed the potential benefits and risks of the procedure, side effects of the proposed treatment, the likelihood of the patient achieving the goals of the procedure, and any potential problems that might occur during the procedure or recuperation. Informed consent has been obtained.  Description of procedure:  The patient was taken to the operating room and general anesthesia was induced.  The patient was placed in the dorsal lithotomy position, prepped and draped in the usual sterile fashion, and preoperative antibiotics were administered. A preoperative time-out was performed.   A 21 French cystoscope was advanced per urethra into the bladder.  Attention was turned to the left ureteral orifice from which a ureteral stent was seen emanating. There was some bullous edema around the UO but otherwise the bladder was unremarkable. The distal coil of  the stent was grasped and brought to level of the urethral meatus. The stent was then cannulated using a sensor wire up to level of the kidney and the stent was removed leaving the wire in place. A 4.5 French semirigid ureteroscope was then advanced through the distal ureter up to the level of the stone within the distal ureter. The ureter was fairly significantly dilated and the stone was deemed amenable to basket extraction. 1.9 [place] to plus nitinol basket was then used to extract the stone atraumatically. The scope was then able to be advanced all the way up to the level of the proximal ureter and no additional stones or stone fragments were identified. Scope was then removed along with a safety wire. The cystoscope was reintroduced back into the bladder. A gentle retrograde pyelogram was performed on the left which revealed a decompressed collecting system with a patent ureter without narrowing. The left kidney draining briskly. Given her history of severe stent colic and atraumatic procedure, the decision was made to forego leaving ureteral stent. The bladder was then drained and the scope was removed. She was cleaned and dried, repositioned the supine position, reversed from general anesthesia, taken the PACU in stable condition.  Plan: Patient may be discharged later today if pain adequately controlled. She'll follow-up with the urology clinic in 4 weeks with a renal ultrasound prior to this. A prescription for additional narcotics was placed on her chart, otherwise no additional prescriptions needed.  Hollice Espy, M.D.

## 2016-11-27 NOTE — Discharge Instructions (Signed)
° °                                                      Ureteroscopy  A Ureteroscopy is an examination of the upper urinary tract, performed with a ureteroscope that is passed through the urethra, the bladder, and then directly into the ureter. It is performed to find the cause of urine blockage in a ureter, perform a biopsy or identify and evaluate other abnormalities inside the ureters or kidneys. It is useful in the diagnosis and treatment of disorders such as kidney stones, ureteral strictures or other abnormalities of the ureter. Smaller stones in the bladder or lower ureter can be removed in one piece, while bigger ones are usually broken before removal during ureteroscopy.  *After your ureteroscopy, your doctor may need to place a stent in a ureter to drain urine from the kidney to the bladder while swelling in the ureter goes away. The stent may cause some discomfort to your kidney and ureter. The discomfort is generally mild. The stent may be left in for a week or more. You will be instructed to either remove the stent yourself at home by pulling a string protruding from your urethra or to return to our office for removal by your doctor.  After a ureteroscopy you may  have a mild burning feeling when urinating see small amounts of blood in the urine have mild discomfort in the bladder area or kidney area when urinating need to urinate more frequently or urgently *These problems should not last more than 24 hours unless a ureteral stent was placed.  If a stent was placed symptoms symptoms may continue until it is removed. You should notify your doctor right away if bleeding or pain is severe.  To prevent or relieve discomfort it can be helpful to drink 16 ounces of water each hour for 2 hours after the procedure take a warm bath to relieve the burning feeling hold a warm, damp washcloth over the urethral opening to relieve discomfort take an over-the-counter pain reliever  Please notify  our office if you experience any of the following symptoms burning with urination lasting more than 24hrs Fever greater than 100F or present directly to emergency department abdominal or flank pain very bloody, cloudy or foul smelling urine  *If you have any additional questions or concerns please call our office at [phone]  [facility] [address] [place], [place] [address] [phone]

## 2016-11-27 NOTE — Progress Notes (Signed)
[facility] [place] NAME: Tracy Whitaker    MR#:  [id number]  DATE OF BIRTH:  1981/07/29  SUBJECTIVE:  CHIEF COMPLAINT:   Chief Complaint  Patient presents with  . Flank Pain     with flank pain, she had IgA stent placed in her left ureter in the past. Seen by urologist and taken for a procedure to remove a double-J stent and a small ureteral stone.   As per urology, patient can be discharged today but she received general anesthesia during the procedure and we held to make sure she has somebody at home and she is not alone tonight, but she could not arrange for anybody to stay with her so we have to keep her for observation tonight in hospital. No complaint of pain anymore.  REVIEW OF SYSTEMS:  CONSTITUTIONAL: No fever, fatigue or weakness.  EYES: No blurred or double vision.  EARS, NOSE, AND THROAT: No tinnitus or ear pain.  RESPIRATORY: No cough, shortness of breath, wheezing or hemoptysis.  CARDIOVASCULAR: No chest pain, orthopnea, edema.  GASTROINTESTINAL: No nausea, vomiting, diarrhea or abdominal pain.  GENITOURINARY: No dysuria, hematuria.  ENDOCRINE: No polyuria, nocturia,  HEMATOLOGY: No anemia, easy bruising or bleeding SKIN: No rash or lesion. MUSCULOSKELETAL: No joint pain or arthritis.   NEUROLOGIC: No tingling, numbness, weakness.  PSYCHIATRY: No anxiety or depression.   ROS  DRUG ALLERGIES:   Allergies  Allergen Reactions  . Latex Dermatitis    VITALS:  Blood pressure 111/78, pulse 62, temperature 97.9 F (36.6 C), temperature source Oral, resp. rate 16, height 5' 4"  (1.626 m), weight 81.7 kg (180 lb 1.6 oz), last menstrual period 11/06/2016, SpO2 98 %.  PHYSICAL EXAMINATION:  GENERAL:  36 y.o.-year-old patient lying in the bed with no acute distress.  EYES: Pupils equal, round, reactive to light and accommodation. No scleral icterus. Extraocular muscles intact.  HEENT: Head atraumatic, normocephalic. Oropharynx  and nasopharynx clear.  NECK:  Supple, no jugular venous distention. No thyroid enlargement, no tenderness.  LUNGS: Normal breath sounds bilaterally, no wheezing, rales,rhonchi or crepitation. No use of accessory muscles of respiration.  CARDIOVASCULAR: S1, S2 normal. No murmurs, rubs, or gallops.  ABDOMEN: Soft, nontender, nondistended. Bowel sounds present. No organomegaly or mass.  EXTREMITIES: No pedal edema, cyanosis, or clubbing.  NEUROLOGIC: Cranial nerves II through XII are intact. Muscle strength 5/5 in all extremities. Sensation intact. Gait not checked.  PSYCHIATRIC: The patient is alert and oriented x 3.  SKIN: No obvious rash, lesion, or ulcer.   Physical Exam LABORATORY PANEL:   CBC  Recent Labs Lab 11/27/16 0501  WBC 5.5  HGB 11.3*  HCT 33.1*  PLT 204   ------------------------------------------------------------------------------------------------------------------  Chemistries   Recent Labs Lab 11/26/16 1712 11/27/16 0501  NA 136 137  K 4.0 3.8  CL 107 109  CO2 23 25  GLUCOSE 95 89  BUN 11 9  CREATININE 0.81 0.66  CALCIUM 8.5* 8.3*  AST 21  --   ALT 15  --   ALKPHOS 40  --   BILITOT 0.4  --    ------------------------------------------------------------------------------------------------------------------  Cardiac Enzymes No results for input(s): TROPONINI in the last 168 hours. ------------------------------------------------------------------------------------------------------------------  RADIOLOGY:  Ct Renal Stone Study  Result Date: 11/26/2016 CLINICAL DATA:  Recent ureteral calculus with stent placement on the left. Persistent left flank pain EXAM: CT ABDOMEN AND PELVIS WITHOUT CONTRAST TECHNIQUE: Multidetector CT imaging of the abdomen and pelvis was performed following the standard protocol without  oral or intravenous contrast material administration. COMPARISON:  November 06, 2016 FINDINGS: Lower chest: Lung bases are clear.  Hepatobiliary: No focal liver lesions are evident on this noncontrast enhanced study. Gallbladder wall is not appreciably thickened. There is no biliary duct dilatation. Pancreas: There is no pancreatic mass or inflammatory focus. Spleen: Spleen measures 14.4 x 9.6 x 6.5 cm with a measured splenic volume of 449 cubic cm. No focal splenic lesions are evident. Adrenals/Urinary Tract: Adrenals appear normal bilaterally. Left kidney is subtly edematous. There is no renal mass on either side. There is no hydronephrosis on the right. There is mild hydronephrosis on the right. A double-J stent extends from the left renal pelvis to the urinary bladder. There is a 1 mm calculus in the upper pole of the left kidney. There is no demonstrable calculus on the right. No ureteral calculus is evident on either side. Note that the double-J stent could obscure a small ureteral calculus. Urinary bladder is midline with wall thickness within normal limits. Stomach/Bowel: There are occasional sigmoid diverticula without diverticulitis. There is no bowel wall or mesenteric thickening. No evident bowel obstruction. No free air or portal venous air. Vascular/Lymphatic: There is no abdominal aortic aneurysm. No vascular lesions are evident on this noncontrast enhanced study. There is no appreciable adenopathy in abdomen or pelvis. Reproductive: Uterus is anteverted. There is no pelvic mass or pelvic fluid collection. Other: Appendix appears normal. There is no ascites or abscess in the abdomen or pelvis. There is soft tissue stranding along the course the left ureter extending from the left renal pelvis to the bladder. No abnormal fluid collection is seen in this area. Musculoskeletal: There are no blastic or lytic bone lesions. There is no intramuscular or abdominal wall lesion. IMPRESSION: Persistent mild hydronephrosis on the left with double-J stent on the left extending from the renal pelvis to the bladder. There is soft tissue  stranding along the course of the left ureter without abnormal fluid or abscess. There is a 1 mm calculus in the upper pole of the left kidney. Left kidney is subtly edematous. No ureteral calculi are evident on either side. It must be cautioned that a small left ureteral calculus could be obscured by the double-J stent. Urinary bladder wall thickness is normal. There are occasional sigmoid diverticula without diverticulitis. No bowel obstruction or bowel wall thickening. No abscess. Appendix appears normal. Prominent spleen without focal splenic lesion evident. Electronically Signed   By: Lowella Grip III M.D.   On: 11/26/2016 21:47    ASSESSMENT AND PLAN:   Active Problems:   Intractable pain   Acute left flank pain  * Acute left flank pain   With ureteral stone      Double-J stent and stone is removed by ureteroscopy, done by urologist.   No need for antibiotic therapy anymore.   Pain medicine prescriptions are given by urologist.   Observation hospital for tonight as she is post-general anesthesia, discharge tomorrow morning.  * Hyponatremia- resolved * Hypokalemia- resolved. * Ulcerative colitis- no acute episode.  All the records are reviewed and case discussed with Care Management/Social Workerr. Management plans discussed with the patient, family and they are in agreement.  CODE STATUS: Full code  TOTAL TIME TAKING CARE OF THIS PATIENT: 35 minutes.   Patient's mother was present in the room during my visit.  POSSIBLE D/C IN 1-2 DAYS, DEPENDING ON CLINICAL CONDITION.   Vaughan Basta M.D on 11/27/2016   Between 7am to 6pm - Pager - [phone]  After 6pm go to [email or web address] - password [email or web address] [facility]  Office  [phone]  CC: Primary care physician; Joylene Igo, CNM  Note: This dictation was prepared with Dragon dictation along with smaller phrase technology. Any transcriptional errors that result from this process are  unintentional.

## 2016-11-28 LAB — URINE CULTURE
Culture: NO GROWTH
Special Requests: NORMAL

## 2016-11-28 NOTE — Discharge Summary (Signed)
[facility] at [place] NAME: Tracy Whitaker    MR#:  [id number]  DATE OF BIRTH:  01-09-1981  DATE OF ADMISSION:  11/26/2016 ADMITTING PHYSICIAN: Harvie Bridge, DO  DATE OF DISCHARGE: 11/28/2016  PRIMARY CARE PHYSICIAN: Melody Rockney Ghee, CNM    ADMISSION DIAGNOSIS:  Acute left flank pain [R10.9] Intractable pain [R52]  DISCHARGE DIAGNOSIS:  Active Problems:   Intractable pain   Acute left flank pain   Ureteral stone  SECONDARY DIAGNOSIS:   Past Medical History:  Diagnosis Date  . ADHD (attention deficit hyperactivity disorder)   . History of stomach ulcers   . Polycystic disease, ovaries   . Ulcerative colitis [facility])     HOSPITAL COURSE:   * Acute left flank pain   With ureteral stone      Double-J stent and stone is removed by ureteroscopy, done by urologist.   No need for antibiotic therapy anymore.   Pain medicine prescriptions are given by urologist.   advised to follow in urology office in 3-4 weeks.  * Hyponatremia- resolved * Hypokalemia- resolved. * Ulcerative colitis- no acute episode.  DISCHARGE CONDITIONS:   Stable.  CONSULTS OBTAINED:  Treatment Team:  Hollice Espy, MD  DRUG ALLERGIES:   Allergies  Allergen Reactions  . Latex Dermatitis    DISCHARGE MEDICATIONS:   Current Discharge Medication List    START taking these medications   Details  oxyCODONE-acetaminophen (PERCOCET) 5-325 MG tablet Take 1-2 tablets by mouth every 4 (four) hours as needed for moderate pain or severe pain. Qty: 10 tablet, Refills: 0      CONTINUE these medications which have NOT CHANGED   Details  acyclovir (ZOVIRAX) 800 MG tablet Take 1 tablet (800 mg total) by mouth 2 (two) times daily. Qty: 60 tablet, Refills: 3    amphetamine-dextroamphetamine (ADDERALL) 20 MG tablet Take 1 tablet (20 mg total) by mouth 2 (two) times daily. Qty: 60 tablet, Refills: 0    AZO-CRANBERRY PO Take 1 tablet by mouth.     DELZICOL 400 MG CPDR DR capsule Take 800 mg by mouth 3 (three) times daily. Refills: 4    escitalopram (LEXAPRO) 20 MG tablet TAKE 1 TABLET (20 MG TOTAL) BY MOUTH DAILY. Qty: 30 tablet, Refills: 4    mirabegron ER (MYRBETRIQ) 25 MG TB24 tablet Take 1 tablet (25 mg total) by mouth daily. Qty: 30 tablet, Refills: 0    nicotine (NICODERM CQ - DOSED IN MG/24 HOURS) 14 mg/24hr patch Place 1 patch (14 mg total) onto the skin daily. Qty: 28 patch, Refills: 0    norgestrel-ethinyl estradiol (LO/OVRAL,CRYSELLE) 0.3-30 MG-MCG tablet Take 1 tablet by mouth daily. Qty: 3 Package, Refills: 4    oxybutynin (DITROPAN) 5 MG tablet Take 1 tablet (5 mg total) by mouth every 8 (eight) hours as needed for bladder spasms. Qty: 30 tablet, Refills: 0    oxyCODONE (OXY IR/ROXICODONE) 5 MG immediate release tablet Take 1 tablet (5 mg total) by mouth every 4 (four) hours as needed for moderate pain. Qty: 30 tablet, Refills: 0    QUEtiapine (SEROQUEL) 200 MG tablet TAKE 1 TABLET (200 MG TOTAL) BY MOUTH AT BEDTIME. Qty: 30 tablet, Refills: 4    tamsulosin (FLOMAX) 0.4 MG CAPS capsule Take 1 capsule (0.4 mg total) by mouth daily. Qty: 30 capsule, Refills: 0         DISCHARGE INSTRUCTIONS:    Follow with urology in 3-4 weeks.  If you experience worsening of your admission  symptoms, develop shortness of breath, life threatening emergency, suicidal or homicidal thoughts you must seek medical attention immediately by calling 911 or calling your MD immediately  if symptoms less severe.  You Must read complete instructions/literature along with all the possible adverse reactions/side effects for all the Medicines you take and that have been prescribed to you. Take any new Medicines after you have completely understood and accept all the possible adverse reactions/side effects.   Please note  You were cared for by a hospitalist during your hospital stay. If you have any questions about your discharge  medications or the care you received while you were in the hospital after you are discharged, you can call the unit and asked to speak with the hospitalist on call if the hospitalist that took care of you is not available. Once you are discharged, your primary care physician will handle any further medical issues. Please note that NO REFILLS for any discharge medications will be authorized once you are discharged, as it is imperative that you return to your primary care physician (or establish a relationship with a primary care physician if you do not have one) for your aftercare needs so that they can reassess your need for medications and monitor your lab values.    Today   CHIEF COMPLAINT:   Chief Complaint  Patient presents with  . Flank Pain    HISTORY OF PRESENT ILLNESS:  Tracy Whitaker  is a 36 y.o. female with a known history of UC, PCOS, kidney stones, L Hydronephrosis, L uretal stent placement 11/06/16 and frequent UTI presents to the emergency department for evaluation of increasing left flank pain.  Patient was in a usual state of health until 3 days prior to arrival when the left sided flank pain became increasingly painful to the point she can no longer manage it. She reports the pain is intermittent, sharp, spastic in nature. She has been taking oxycodone but this has not helped. She admits burning with urination, urgency and nausea. Denies hematuria, fever, chills, diarrhea/constipation, CP or SOB. Denies pregnancy, vaginal discharge.    Patient was recently hospitalized with sepsis 2/2 kidney stone and underwent L uretal stent placement on 11/06/16. States she has been experiencing some pain for 3 weeks prior to admission which was manageable.  Patient has been taking medication as prescribed and there has been no recent change in medication or diet.  There has been no travel or sick contacts.     VITAL SIGNS:  Blood pressure 116/64, pulse (!) 58, temperature 98.2 F (36.8 C),  temperature source Oral, resp. rate 16, height 5' 4"  (1.626 m), weight 81.7 kg (180 lb 1.6 oz), last menstrual period 11/06/2016, SpO2 97 %.  I/O:   Intake/Output Summary (Last 24 hours) at 11/28/16 0857 Last data filed at 11/28/16 0517  Gross per 24 hour  Intake             1362 ml  Output             2325 ml  Net             -963 ml    PHYSICAL EXAMINATION:  GENERAL:  36 y.o.-year-old patient lying in the bed with no acute distress.  EYES: Pupils equal, round, reactive to light and accommodation. No scleral icterus. Extraocular muscles intact.  HEENT: Head atraumatic, normocephalic. Oropharynx and nasopharynx clear.  NECK:  Supple, no jugular venous distention. No thyroid enlargement, no tenderness.  LUNGS: Normal breath sounds bilaterally, no wheezing,  rales,rhonchi or crepitation. No use of accessory muscles of respiration.  CARDIOVASCULAR: S1, S2 normal. No murmurs, rubs, or gallops.  ABDOMEN: Soft, non-tender, non-distended. Bowel sounds present. No organomegaly or mass.  EXTREMITIES: No pedal edema, cyanosis, or clubbing.  NEUROLOGIC: Cranial nerves II through XII are intact. Muscle strength 5/5 in all extremities. Sensation intact. Gait not checked.  PSYCHIATRIC: The patient is alert and oriented x 3.  SKIN: No obvious rash, lesion, or ulcer.   DATA REVIEW:   CBC  Recent Labs Lab 11/27/16 0501  WBC 5.5  HGB 11.3*  HCT 33.1*  PLT 204    Chemistries   Recent Labs Lab 11/26/16 1712 11/27/16 0501  NA 136 137  K 4.0 3.8  CL 107 109  CO2 23 25  GLUCOSE 95 89  BUN 11 9  CREATININE 0.81 0.66  CALCIUM 8.5* 8.3*  AST 21  --   ALT 15  --   ALKPHOS 40  --   BILITOT 0.4  --     Cardiac Enzymes No results for input(s): TROPONINI in the last 168 hours.  Microbiology Results  Results for orders placed or performed during the hospital encounter of 11/26/16  Urine culture     Status: None   Collection Time: 11/26/16  7:50 PM  Result Value Ref Range Status    Specimen Description URINE, CLEAN CATCH  Final   Special Requests Normal  Final   Culture NO GROWTH Performed at [facility]   Final   Report Status 11/28/2016 FINAL  Final    RADIOLOGY:  Ct Renal Stone Study  Result Date: 11/26/2016 CLINICAL DATA:  Recent ureteral calculus with stent placement on the left. Persistent left flank pain EXAM: CT ABDOMEN AND PELVIS WITHOUT CONTRAST TECHNIQUE: Multidetector CT imaging of the abdomen and pelvis was performed following the standard protocol without oral or intravenous contrast material administration. COMPARISON:  November 06, 2016 FINDINGS: Lower chest: Lung bases are clear. Hepatobiliary: No focal liver lesions are evident on this noncontrast enhanced study. Gallbladder wall is not appreciably thickened. There is no biliary duct dilatation. Pancreas: There is no pancreatic mass or inflammatory focus. Spleen: Spleen measures 14.4 x 9.6 x 6.5 cm with a measured splenic volume of 449 cubic cm. No focal splenic lesions are evident. Adrenals/Urinary Tract: Adrenals appear normal bilaterally. Left kidney is subtly edematous. There is no renal mass on either side. There is no hydronephrosis on the right. There is mild hydronephrosis on the right. A double-J stent extends from the left renal pelvis to the urinary bladder. There is a 1 mm calculus in the upper pole of the left kidney. There is no demonstrable calculus on the right. No ureteral calculus is evident on either side. Note that the double-J stent could obscure a small ureteral calculus. Urinary bladder is midline with wall thickness within normal limits. Stomach/Bowel: There are occasional sigmoid diverticula without diverticulitis. There is no bowel wall or mesenteric thickening. No evident bowel obstruction. No free air or portal venous air. Vascular/Lymphatic: There is no abdominal aortic aneurysm. No vascular lesions are evident on this noncontrast enhanced study. There is no appreciable  adenopathy in abdomen or pelvis. Reproductive: Uterus is anteverted. There is no pelvic mass or pelvic fluid collection. Other: Appendix appears normal. There is no ascites or abscess in the abdomen or pelvis. There is soft tissue stranding along the course the left ureter extending from the left renal pelvis to the bladder. No abnormal fluid collection is seen in this area. Musculoskeletal:  There are no blastic or lytic bone lesions. There is no intramuscular or abdominal wall lesion. IMPRESSION: Persistent mild hydronephrosis on the left with double-J stent on the left extending from the renal pelvis to the bladder. There is soft tissue stranding along the course of the left ureter without abnormal fluid or abscess. There is a 1 mm calculus in the upper pole of the left kidney. Left kidney is subtly edematous. No ureteral calculi are evident on either side. It must be cautioned that a small left ureteral calculus could be obscured by the double-J stent. Urinary bladder wall thickness is normal. There are occasional sigmoid diverticula without diverticulitis. No bowel obstruction or bowel wall thickening. No abscess. Appendix appears normal. Prominent spleen without focal splenic lesion evident. Electronically Signed   By: Lowella Grip III M.D.   On: 11/26/2016 21:47    EKG:  No orders found for this or any previous visit.    Management plans discussed with the patient, family and they are in agreement.  CODE STATUS:     Code Status Orders        Start     Ordered   11/27/16 0029  Full code  Continuous     11/27/16 0028    Code Status History    Date Active Date Inactive Code Status Order ID Comments User Context   11/06/2016  4:07 PM 11/09/2016  5:52 PM Full Code [id number]  Demetrios Loll, MD Inpatient      TOTAL TIME TAKING CARE OF THIS PATIENT: 35 minutes.    Vaughan Basta M.D on 11/28/2016 at 8:57 AM  Between 7am to 6pm - Pager - [phone]  After 6pm go to  [email or web address] - password [email or web address] [facility]  Office  [phone]  CC: Primary care physician; Joylene Igo, CNM   Note: This dictation was prepared with Dragon dictation along with smaller phrase technology. Any transcriptional errors that result from this process are unintentional.

## 2016-11-28 NOTE — Progress Notes (Signed)
Patient discharge this shift ny staff with significant other By side; voices understanding of discharge instructions; denies pain at this time.

## 2016-11-28 NOTE — Progress Notes (Signed)
Urology Consult Follow Up  Subjective: No issues overnight.  Stayed for social reasons.  Denies pain this AM.  Voiding without difficulty.    Anti-infectives: Anti-infectives    Start     Dose/Rate Route Frequency Ordered Stop   11/27/16 2200  acyclovir (ZOVIRAX) 200 MG capsule 800 mg     800 mg Oral 2 times daily 11/27/16 2125     11/27/16 1000  acyclovir (ZOVIRAX) tablet 800 mg  Status:  Discontinued     800 mg Oral 2 times daily 11/27/16 0028 11/27/16 2125   11/27/16 0200  cefTRIAXone (ROCEPHIN) IVPB 1 g     1 g 100 mL/hr over 30 Minutes Intravenous Daily at bedtime 11/27/16 0045        Current Facility-Administered Medications  Medication Dose Route Frequency Provider Last Rate Last Dose  . acetaminophen (TYLENOL) tablet 650 mg  650 mg Oral Q6H PRN Alexis Hugelmeyer, DO       Or  . acetaminophen (TYLENOL) suppository 650 mg  650 mg Rectal Q6H PRN Alexis Hugelmeyer, DO      . acidophilus (RISAQUAD) capsule 2 capsule  2 capsule Oral TID Alexis Hugelmeyer, DO   2 capsule at 11/28/16 0921  . acyclovir (ZOVIRAX) 200 MG capsule 800 mg  800 mg Oral BID Vaughan Basta, MD   800 mg at 11/28/16 0921  . albuterol (PROVENTIL) (2.5 MG/3ML) 0.083% nebulizer solution 2.5 mg  2.5 mg Nebulization Q6H PRN Alexis Hugelmeyer, DO      . amphetamine-dextroamphetamine (ADDERALL) tablet 20 mg  20 mg Oral BID Alexis Hugelmeyer, DO      . bisacodyl (DULCOLAX) EC tablet 5 mg  5 mg Oral Daily PRN Alexis Hugelmeyer, DO      . cefTRIAXone (ROCEPHIN) IVPB 1 g  1 g Intravenous QHS Alexis Hugelmeyer, DO   1 g at 11/27/16 2121  . escitalopram (LEXAPRO) tablet 20 mg  20 mg Oral Daily Alexis Hugelmeyer, DO   20 mg at 11/28/16 0921  . HYDROmorphone (DILAUDID) injection 1 mg  1 mg Intravenous Q4H PRN Alexis Hugelmeyer, DO   1 mg at 11/27/16 1807  . magnesium citrate solution 1 Bottle  1 Bottle Oral Once PRN Alexis Hugelmeyer, DO      . Mesalamine (ASACOL) DR capsule 800 mg  800 mg Oral TID Alexis Hugelmeyer, DO    800 mg at 11/28/16 0921  . mirabegron ER (MYRBETRIQ) tablet 25 mg  25 mg Oral Daily Alexis Hugelmeyer, DO   25 mg at 11/28/16 0921  . morphine 4 MG/ML injection 2 mg  2 mg Intravenous Q4H PRN Alexis Hugelmeyer, DO   2 mg at 11/27/16 0806  . nicotine (NICODERM CQ - dosed in mg/24 hours) patch 14 mg  14 mg Transdermal Daily Alexis Hugelmeyer, DO   14 mg at 11/27/16 0944  . norgestrel-ethinyl estradiol (LO/OVRAL,CRYSELLE) 0.3-30 MG-MCG per tablet 1 tablet  1 tablet Oral Daily Alexis Hugelmeyer, DO      . ondansetron (ZOFRAN) tablet 4 mg  4 mg Oral Q6H PRN Alexis Hugelmeyer, DO       Or  . ondansetron (ZOFRAN) injection 4 mg  4 mg Intravenous Q6H PRN Alexis Hugelmeyer, DO      . oxybutynin (DITROPAN) tablet 5 mg  5 mg Oral Q8H PRN Alexis Hugelmeyer, DO      . oxyCODONE-acetaminophen (PERCOCET/ROXICET) 5-325 MG per tablet 1-2 tablet  1-2 tablet Oral Q4H PRN Alexis Hugelmeyer, DO   2 tablet at 11/28/16 0921  . QUEtiapine (SEROQUEL) tablet 200 mg  200 mg Oral QHS Alexis Hugelmeyer, DO   200 mg at 11/27/16 2121  . senna-docusate (Senokot-S) tablet 1 tablet  1 tablet Oral QHS PRN Alexis Hugelmeyer, DO      . tamsulosin (FLOMAX) capsule 0.4 mg  0.4 mg Oral Daily Alexis Hugelmeyer, DO         Objective: Vital signs in last 24 hours: Temp:  [97.5 F (36.4 C)-98.7 F (37.1 C)] 98.2 F (36.8 C) (01/07 0806) Pulse Rate:  [56-78] 58 (01/07 0806) Resp:  [12-18] 16 (01/07 0806) BP: (92-116)/(54-80) 116/64 (01/07 0806) SpO2:  [94 %-100 %] 97 % (01/07 0806)  Intake/Output from previous day: 01/06 0701 - 01/07 0700 In: 2044 [P.O.:240; I.V.:1804] Out: 2325 [Urine:2325] Intake/Output this shift: Total I/O In: 480 [P.O.:480] Out: -    Physical Exam  Constitutional: She is oriented to person, place, and time and well-developed, well-nourished, and in no distress.  Pulmonary/Chest: Effort normal and breath sounds normal.  Abdominal: Soft. She exhibits no distension. There is no tenderness.   Musculoskeletal: Normal range of motion.  Neurological: She is alert and oriented to person, place, and time.  Skin: Skin is warm and dry.  Psychiatric: Affect and judgment normal.    Lab Results:   Recent Labs  11/26/16 1712 11/27/16 0501  WBC 7.2 5.5  HGB 13.0 11.3*  HCT 37.7 33.1*  PLT 253 204   BMET  Recent Labs  11/26/16 1712 11/27/16 0501  NA 136 137  K 4.0 3.8  CL 107 109  CO2 23 25  GLUCOSE 95 89  BUN 11 9  CREATININE 0.81 0.66  CALCIUM 8.5* 8.3*   PT/INR No results for input(s): LABPROT, INR in the last 72 hours. ABG No results for input(s): PHART, HCO3 in the last 72 hours.  Invalid input(s): PCO2, PO2  Studies/Results: Ct Renal Stone Study  Result Date: 11/26/2016 CLINICAL DATA:  Recent ureteral calculus with stent placement on the left. Persistent left flank pain EXAM: CT ABDOMEN AND PELVIS WITHOUT CONTRAST TECHNIQUE: Multidetector CT imaging of the abdomen and pelvis was performed following the standard protocol without oral or intravenous contrast material administration. COMPARISON:  November 06, 2016 FINDINGS: Lower chest: Lung bases are clear. Hepatobiliary: No focal liver lesions are evident on this noncontrast enhanced study. Gallbladder wall is not appreciably thickened. There is no biliary duct dilatation. Pancreas: There is no pancreatic mass or inflammatory focus. Spleen: Spleen measures 14.4 x 9.6 x 6.5 cm with a measured splenic volume of 449 cubic cm. No focal splenic lesions are evident. Adrenals/Urinary Tract: Adrenals appear normal bilaterally. Left kidney is subtly edematous. There is no renal mass on either side. There is no hydronephrosis on the right. There is mild hydronephrosis on the right. A double-J stent extends from the left renal pelvis to the urinary bladder. There is a 1 mm calculus in the upper pole of the left kidney. There is no demonstrable calculus on the right. No ureteral calculus is evident on either side. Note that the  double-J stent could obscure a small ureteral calculus. Urinary bladder is midline with wall thickness within normal limits. Stomach/Bowel: There are occasional sigmoid diverticula without diverticulitis. There is no bowel wall or mesenteric thickening. No evident bowel obstruction. No free air or portal venous air. Vascular/Lymphatic: There is no abdominal aortic aneurysm. No vascular lesions are evident on this noncontrast enhanced study. There is no appreciable adenopathy in abdomen or pelvis. Reproductive: Uterus is anteverted. There is no pelvic mass or pelvic fluid collection. Other: Appendix  appears normal. There is no ascites or abscess in the abdomen or pelvis. There is soft tissue stranding along the course the left ureter extending from the left renal pelvis to the bladder. No abnormal fluid collection is seen in this area. Musculoskeletal: There are no blastic or lytic bone lesions. There is no intramuscular or abdominal wall lesion. IMPRESSION: Persistent mild hydronephrosis on the left with double-J stent on the left extending from the renal pelvis to the bladder. There is soft tissue stranding along the course of the left ureter without abnormal fluid or abscess. There is a 1 mm calculus in the upper pole of the left kidney. Left kidney is subtly edematous. No ureteral calculi are evident on either side. It must be cautioned that a small left ureteral calculus could be obscured by the double-J stent. Urinary bladder wall thickness is normal. There are occasional sigmoid diverticula without diverticulitis. No bowel obstruction or bowel wall thickening. No abscess. Appendix appears normal. Prominent spleen without focal splenic lesion evident. Electronically Signed   By: Lowella Grip III M.D.   On: 11/26/2016 21:47     Assessment: s/p Procedure(s): Postop day 1 status post left ureteroscopy, basket extraction of stone, left ureteral stent removal, retrograde pyelogram, doing  well  Plan: -Appropriate for discharge  -Scripts on chart -Follow-up arranged, renal ultrasound in 4 weeks  -Warning symptoms reviewed     LOS: 2 days    Hollice Espy 11/28/2016

## 2016-11-28 NOTE — Pre-Procedure Instructions (Signed)
11/28/16  Urology follow-up  Subjective:  No issues overnight. Pain now minimal. Forwarding with minimal discomfort. Anxious to go home this morning.  Vitals reviewed. No acute distress. Alert and oriented times three. No respiratory distress. Abdomen soft, nontender, nondistended. No CVA tenderness bilaterally. No suprapubic tenderness. No alerts from the Edema.  36 year old female admitted for refr actory stent colic postop day one status post left ureteroscopy, basket extraction of stone, stent removal. -OK for discharge from urology perspective -  narcotic script given -Outpatient follow-up with renal ultrasound in four weeks obtained -warning symptoms reviewed  Hollice Espy

## 2016-11-29 ENCOUNTER — Ambulatory Visit: Payer: Self-pay | Admitting: Urology

## 2016-11-29 ENCOUNTER — Encounter: Payer: Self-pay | Admitting: Urology

## 2016-11-29 NOTE — Telephone Encounter (Signed)
I have taken care of this and I will contact the patient to give her the appt information.  Sharyn Lull

## 2016-12-03 LAB — STONE ANALYSIS
Ca Oxalate,Monohydr.: 20 %
Ca phos cry stone ql IR: 78 %
Stone Weight KSTONE: 31.2 mg

## 2016-12-27 ENCOUNTER — Ambulatory Visit
Admission: RE | Admit: 2016-12-27 | Discharge: 2016-12-27 | Disposition: A | Discharge status: Home / Self Care | Payer: BLUE CROSS/BLUE SHIELD | Source: Ambulatory Visit | Attending: Urology | Admitting: Urology

## 2016-12-27 ENCOUNTER — Ambulatory Visit: Payer: Self-pay

## 2016-12-27 DIAGNOSIS — N201 Calculus of ureter: Secondary | ICD-10-CM

## 2016-12-31 ENCOUNTER — Ambulatory Visit (INDEPENDENT_AMBULATORY_CARE_PROVIDER_SITE_OTHER): Payer: BLUE CROSS/BLUE SHIELD | Admitting: Urology

## 2016-12-31 ENCOUNTER — Encounter: Payer: Self-pay | Admitting: Urology

## 2016-12-31 VITALS — BP 96/63 | HR 84 | Ht 64.0 in | Wt 184.5 lb

## 2016-12-31 DIAGNOSIS — N200 Calculus of kidney: Secondary | ICD-10-CM

## 2016-12-31 NOTE — Progress Notes (Signed)
12/31/2016 3:57 PM   Tracy Whitaker 1981-10-11 [id number]  Referring provider: Joylene Igo, CNM [address] [place], [place] [address]  Chief Complaint  Patient presents with  . Nephrolithiasis    HPI: The patient is a 36 year old female who recently underwent left ureteroscopy for a 4 mm stone. She initially presented with a UTI and was temporized the stent before undergoing definitive management. There was no stent left at the end of her last procedure due to poor tolerance of the stent previously. She presents today for post-instrumentation renal ultrasound which is normal with no sign of hydronephrosis. Her stone was 20% calcium oxalate monohydrate in 70% calcium phosphate. "Two percent of the specimen appears to be consistent an unknown compound" per the report.  PMH: Past Medical History:  Diagnosis Date  . ADHD (attention deficit hyperactivity disorder)   . History of stomach ulcers   . Polycystic disease, ovaries   . Ulcerative colitis [facility])     Surgical History: Past Surgical History:  Procedure Laterality Date  . CYSTOSCOPY WITH STENT PLACEMENT Left 11/06/2016   Procedure: CYSTOSCOPY WITH STENT PLACEMENT;  Surgeon: Nickie Retort, MD;  Location: [facility] ORS;  Service: Urology;  Laterality: Left;  . CYSTOSCOPY/URETEROSCOPY/HOLMIUM LASER/STENT PLACEMENT Left 11/27/2016   Procedure: CYSTOSCOPY/URETEROSCOPY/HOLMIUM LASER/STENT  rePLACEMENT;  Surgeon: Hollice Espy, MD;  Location: [facility] ORS;  Service: Urology;  Laterality: Left;  . Surgery for polycystic ovary      Home Medications:  Allergies as of 12/31/2016      Reactions   Latex Dermatitis      Medication List       Accurate as of 12/31/16  3:57 PM. Always use your most recent med list.          acyclovir 800 MG tablet Commonly known as:  ZOVIRAX Take 1 tablet (800 mg total) by mouth 2 (two) times daily.   amphetamine-dextroamphetamine 20 MG tablet Commonly known as:  ADDERALL Take  1 tablet (20 mg total) by mouth 2 (two) times daily.   AZO-CRANBERRY PO Take 1 tablet by mouth.   DELZICOL 400 MG Cpdr DR capsule Generic drug:  Mesalamine Take 800 mg by mouth 3 (three) times daily.   escitalopram 20 MG tablet Commonly known as:  LEXAPRO TAKE 1 TABLET (20 MG TOTAL) BY MOUTH DAILY.   mirabegron ER 25 MG Tb24 tablet Commonly known as:  MYRBETRIQ Take 1 tablet (25 mg total) by mouth daily.   nicotine 14 mg/24hr patch Commonly known as:  NICODERM CQ - dosed in mg/24 hours Place 1 patch (14 mg total) onto the skin daily.   norgestrel-ethinyl estradiol 0.3-30 MG-MCG tablet Commonly known as:  LO/OVRAL,CRYSELLE Take 1 tablet by mouth daily.   oxybutynin 5 MG tablet Commonly known as:  DITROPAN Take 1 tablet (5 mg total) by mouth every 8 (eight) hours as needed for bladder spasms.   oxyCODONE 5 MG immediate release tablet Commonly known as:  Oxy IR/ROXICODONE Take 1 tablet (5 mg total) by mouth every 4 (four) hours as needed for moderate pain.   oxyCODONE-acetaminophen 5-325 MG tablet Commonly known as:  PERCOCET Take 1-2 tablets by mouth every 4 (four) hours as needed for moderate pain or severe pain.   QUEtiapine 200 MG tablet Commonly known as:  SEROQUEL TAKE 1 TABLET (200 MG TOTAL) BY MOUTH AT BEDTIME.   tamsulosin 0.4 MG Caps capsule Commonly known as:  FLOMAX Take 1 capsule (0.4 mg total) by mouth daily.  Allergies:  Allergies  Allergen Reactions  . Latex Dermatitis    Family History: Family History  Problem Relation Age of Onset  . Kidney Stones Father   . Kidney cancer Neg Hx   . Kidney disease Neg Hx   . Prostate cancer Neg Hx     Social History:  reports that she has been smoking Cigarettes.  She has been smoking about 1.00 pack per day. She has never used smokeless tobacco. She reports that she drinks about 2.4 oz of alcohol per week . She reports that she does not use drugs.  ROS: UROLOGY Frequent Urination?: No Hard to  postpone urination?: Yes Burning/pain with urination?: No Get up at night to urinate?: No Leakage of urine?: No Urine stream starts and stops?: No Trouble starting stream?: No Do you have to strain to urinate?: No Blood in urine?: No Urinary tract infection?: No Sexually transmitted disease?: No Injury to kidneys or bladder?: No Painful intercourse?: No Weak stream?: No Currently pregnant?: No Vaginal bleeding?: No Last menstrual period?: n  Gastrointestinal Nausea?: No Vomiting?: No Indigestion/heartburn?: No Diarrhea?: No Constipation?: No  Constitutional Fever: No Night sweats?: No Weight loss?: No Fatigue?: No  Skin Skin rash/lesions?: No Itching?: No  Eyes Blurred vision?: No Double vision?: No  Ears/Nose/Throat Sore throat?: No Sinus problems?: No  Hematologic/Lymphatic Swollen glands?: No Easy bruising?: No  Cardiovascular Leg swelling?: No Chest pain?: No  Respiratory Cough?: No Shortness of breath?: No  Endocrine Excessive thirst?: No  Musculoskeletal Back pain?: No Joint pain?: No  Neurological Headaches?: No Dizziness?: No  Psychologic Depression?: No Anxiety?: No  Physical Exam: BP 96/63 (BP Location: Left Arm, Patient Position: Sitting, Cuff Size: Large)   Pulse 84   Ht 5\' 4"  (1.626 m)   Wt 184 lb 8 oz (83.7 kg)   BMI 31.67 kg/m   Constitutional:  Alert and oriented, No acute distress. HEENT: [place] AT, moist mucus membranes.  Trachea midline, no masses. Cardiovascular: No clubbing, cyanosis, or edema. Respiratory: Normal respiratory effort, no increased work of breathing. GI: Abdomen is soft, nontender, nondistended, no abdominal masses GU: No CVA tenderness.  Skin: No rashes, bruises or suspicious lesions. Lymph: No cervical or inguinal adenopathy. Neurologic: Grossly intact, no focal deficits, moving all 4 extremities. Psychiatric: Normal mood and affect.  Laboratory Data: Lab Results  Component Value Date   WBC 5.5  11/27/2016   HGB 11.3 (L) 11/27/2016   HCT 33.1 (L) 11/27/2016   MCV 94.5 11/27/2016   PLT 204 11/27/2016    Lab Results  Component Value Date   CREATININE 0.66 11/27/2016    No results found for: PSA  No results found for: TESTOSTERONE  Lab Results  Component Value Date   HGBA1C 5.0 04/16/2016    Urinalysis    Component Value Date/Time   COLORURINE AMBER (A) 11/26/2016 1950   APPEARANCEUR CLEAR (A) 11/26/2016 1950   LABSPEC 1.004 (L) 11/26/2016 1950   PHURINE 7.0 11/26/2016 1950   GLUCOSEU NEGATIVE 11/26/2016 1950   HGBUR SMALL (A) 11/26/2016 1950   BILIRUBINUR NEGATIVE 11/26/2016 1950   BILIRUBINUR small 02/09/2016 [place] 11/26/2016 1950   PROTEINUR NEGATIVE 11/26/2016 1950   UROBILINOGEN 0.2 02/09/2016 1548   NITRITE POSITIVE (A) 11/26/2016 1950   LEUKOCYTESUR TRACE (A) 11/26/2016 1950    Pertinent Imaging: Renal ultrasound reviewed as above.  Assessment & Plan:   1. Nephrolithiasis I went over the patient's renal ultrasound report as well as her stone analysis. We discussed dietary modifications  to minimize the chance of recurrent nephrolithiasis. We discussed also increasing fluid intake. We were over the ABCs of stone formation and she was given a copy of this booklet   Return if symptoms worsen or fail to improve.  Nickie Retort, MD  [facility] [address], [place],  [address] [phone]

## 2017-01-06 ENCOUNTER — Other Ambulatory Visit: Payer: Self-pay | Admitting: *Deleted

## 2017-01-06 MED ORDER — NORGESTREL-ETHINYL ESTRADIOL 0.3-30 MG-MCG PO TABS: 1.0000 | ORAL_TABLET | Freq: Every day | ORAL | 2 refills | Status: DC

## 2017-01-10 ENCOUNTER — Telehealth: Payer: Self-pay | Admitting: Obstetrics and Gynecology

## 2017-01-10 NOTE — Telephone Encounter (Signed)
Pt needs refill on her   amphetamine-dextroamphetamine (ADDERALL) 20 MG tablet  Please call pt when ready to pick up.  Thanks [employer]

## 2017-01-11 ENCOUNTER — Other Ambulatory Visit: Payer: Self-pay | Admitting: *Deleted

## 2017-01-11 MED ORDER — AMPHETAMINE-DEXTROAMPHETAMINE 20 MG PO TABS: 20.0000 mg | ORAL_TABLET | Freq: Two times a day (BID) | ORAL | 0 refills | Status: DC

## 2017-01-11 NOTE — Telephone Encounter (Signed)
Done-ac 

## 2017-01-21 ENCOUNTER — Telehealth: Payer: Self-pay | Admitting: Obstetrics and Gynecology

## 2017-01-21 NOTE — Telephone Encounter (Signed)
PT CALLED AND SHE WANTED TO KNO WIF HER MEDS HAD BEEN PRI AUTH.

## 2017-01-27 ENCOUNTER — Other Ambulatory Visit: Payer: Self-pay | Admitting: *Deleted

## 2017-01-27 MED ORDER — SPIRONOLACTONE 100 MG PO TABS: 100.0000 mg | ORAL_TABLET | Freq: Every day | ORAL | 2 refills | Status: DC

## 2017-01-27 NOTE — Telephone Encounter (Signed)
Done-ac 

## 2017-02-02 ENCOUNTER — Telehealth: Payer: Self-pay | Admitting: Obstetrics and Gynecology

## 2017-02-02 ENCOUNTER — Other Ambulatory Visit: Payer: Self-pay | Admitting: *Deleted

## 2017-02-02 MED ORDER — METRONIDAZOLE 500 MG PO TABS: 500.0000 mg | ORAL_TABLET | Freq: Two times a day (BID) | ORAL | 2 refills | Status: DC

## 2017-02-02 NOTE — Telephone Encounter (Signed)
Patient needs a refill on her medicine that she uses for her BV. She uses the [facility] on [address]. Please advise.

## 2017-02-02 NOTE — Telephone Encounter (Signed)
Done-ac 

## 2017-03-16 ENCOUNTER — Other Ambulatory Visit: Payer: Self-pay | Admitting: *Deleted

## 2017-03-16 ENCOUNTER — Telehealth: Payer: Self-pay | Admitting: Obstetrics and Gynecology

## 2017-03-16 MED ORDER — AMPHETAMINE-DEXTROAMPHETAMINE 20 MG PO TABS: 20.0000 mg | ORAL_TABLET | Freq: Two times a day (BID) | ORAL | 0 refills | Status: DC

## 2017-03-16 NOTE — Telephone Encounter (Signed)
Patient needs a refill on her Adderall. Please advise

## 2017-03-16 NOTE — Telephone Encounter (Signed)
Put on [facility] desk to sign 

## 2017-04-17 ENCOUNTER — Other Ambulatory Visit: Payer: Self-pay | Admitting: Obstetrics and Gynecology

## 2017-04-22 ENCOUNTER — Ambulatory Visit (INDEPENDENT_AMBULATORY_CARE_PROVIDER_SITE_OTHER): Payer: BLUE CROSS/BLUE SHIELD | Admitting: Obstetrics and Gynecology

## 2017-04-22 ENCOUNTER — Other Ambulatory Visit: Payer: Self-pay | Admitting: Obstetrics and Gynecology

## 2017-04-22 ENCOUNTER — Encounter: Payer: Self-pay | Admitting: Obstetrics and Gynecology

## 2017-04-22 VITALS — BP 122/80 | HR 90 | Ht 64.5 in | Wt 174.6 lb

## 2017-04-22 DIAGNOSIS — Z01419 Encounter for gynecological examination (general) (routine) without abnormal findings: Secondary | ICD-10-CM

## 2017-04-22 MED ORDER — ESCITALOPRAM OXALATE 20 MG PO TABS: 20.0000 mg | ORAL_TABLET | Freq: Every day | ORAL | 4 refills | Status: DC

## 2017-04-22 MED ORDER — DELZICOL 400 MG PO CPDR: 800.0000 mg | DELAYED_RELEASE_CAPSULE | Freq: Three times a day (TID) | ORAL | 4 refills | Status: DC

## 2017-04-22 MED ORDER — PHENTERMINE HCL 37.5 MG PO TABS: 37.5000 mg | ORAL_TABLET | Freq: Every day | ORAL | 2 refills | Status: DC

## 2017-04-22 MED ORDER — AMPHETAMINE-DEXTROAMPHETAMINE 20 MG PO TABS: 20.0000 mg | ORAL_TABLET | Freq: Two times a day (BID) | ORAL | 0 refills | Status: DC

## 2017-04-22 MED ORDER — NORGESTREL-ETHINYL ESTRADIOL 0.3-30 MG-MCG PO TABS: 1.0000 | ORAL_TABLET | Freq: Every day | ORAL | 4 refills | Status: DC

## 2017-04-22 NOTE — Progress Notes (Signed)
Subjective:   Tracy Whitaker is a 36 y.o. G57P0 Caucasian female here for a routine well-woman exam.  No LMP recorded. Patient is not currently having periods (Reason: Oral contraceptives).    Current complaints: BV keeps happening after sex, and occasional bleeding after sex. Also shoulder pain is getting worse, states breast are same size but harder time finding bra that doesn't dig into shoulders and also has upper & mid back painby end of every work day. PCP: me       does desire labs  Social History: Sexual: heterosexual Marital Status: single Living situation: with daughter Occupation: works at [employer] office Tobacco/alcohol: no change Illicit drugs: no history of illicit drug use  The following portions of the patient's history were reviewed and updated as appropriate: allergies, current medications, past family history, past medical history, past social history, past surgical history and problem list.  Past Medical History Past Medical History:  Diagnosis Date  . ADHD (attention deficit hyperactivity disorder)   . History of stomach ulcers   . Polycystic disease, ovaries   . Ulcerative colitis [facility])     Past Surgical History Past Surgical History:  Procedure Laterality Date  . CYSTOSCOPY WITH STENT PLACEMENT Left 11/06/2016   Procedure: CYSTOSCOPY WITH STENT PLACEMENT;  Surgeon: Nickie Retort, MD;  Location: [facility] ORS;  Service: Urology;  Laterality: Left;  . CYSTOSCOPY/URETEROSCOPY/HOLMIUM LASER/STENT PLACEMENT Left 11/27/2016   Procedure: CYSTOSCOPY/URETEROSCOPY/HOLMIUM LASER/STENT  rePLACEMENT;  Surgeon: Hollice Espy, MD;  Location: [facility] ORS;  Service: Urology;  Laterality: Left;  . Surgery for polycystic ovary      Gynecologic History G1P0  No LMP recorded. Patient is not currently having periods (Reason: Oral contraceptives). Contraception: OCP (estrogen/progesterone) Last Pap: 2016. Results were: normal   Obstetric History OB History  Gravida  Para Term Preterm AB Living  1            SAB TAB Ectopic Multiple Live Births               # Outcome Date GA Lbr Len/2nd Weight Sex Delivery Anes PTL Lv  1 Gravida 2008    F Vag-Spont         Current Medications Current Outpatient Prescriptions on File Prior to Visit  Medication Sig Dispense Refill  . acyclovir (ZOVIRAX) 800 MG tablet Take 1 tablet (800 mg total) by mouth 2 (two) times daily. 60 tablet 3  . amphetamine-dextroamphetamine (ADDERALL) 20 MG tablet Take 1 tablet (20 mg total) by mouth 2 (two) times daily. 60 tablet 0  . DELZICOL 400 MG CPDR DR capsule Take 800 mg by mouth 3 (three) times daily.  4  . escitalopram (LEXAPRO) 20 MG tablet TAKE 1 TABLET (20 MG TOTAL) BY MOUTH DAILY. 30 tablet 4  . nicotine (NICODERM CQ - DOSED IN MG/24 HOURS) 14 mg/24hr patch Place 1 patch (14 mg total) onto the skin daily. 28 patch 0  . norgestrel-ethinyl estradiol (LO/OVRAL,CRYSELLE) 0.3-30 MG-MCG tablet Take 1 tablet by mouth daily. 3 Package 2  . QUEtiapine (SEROQUEL) 200 MG tablet TAKE 1 TABLET (200 MG TOTAL) BY MOUTH AT BEDTIME. 30 tablet 2  . spironolactone (ALDACTONE) 100 MG tablet Take 1 tablet (100 mg total) by mouth daily. 60 tablet 2  . AZO-CRANBERRY PO Take 1 tablet by mouth.    . metroNIDAZOLE (FLAGYL) 500 MG tablet Take 1 tablet (500 mg total) by mouth 2 (two) times daily. (Patient not taking: Reported on 04/22/2017) 14 tablet 2  . mirabegron ER (MYRBETRIQ) 25 MG  TB24 tablet Take 1 tablet (25 mg total) by mouth daily. (Patient not taking: Reported on 12/31/2016) 30 tablet 0  . oxybutynin (DITROPAN) 5 MG tablet Take 1 tablet (5 mg total) by mouth every 8 (eight) hours as needed for bladder spasms. (Patient not taking: Reported on 12/31/2016) 30 tablet 0  . oxyCODONE (OXY IR/ROXICODONE) 5 MG immediate release tablet Take 1 tablet (5 mg total) by mouth every 4 (four) hours as needed for moderate pain. (Patient not taking: Reported on 12/31/2016) 30 tablet 0  . oxyCODONE-acetaminophen  (PERCOCET) 5-325 MG tablet Take 1-2 tablets by mouth every 4 (four) hours as needed for moderate pain or severe pain. (Patient not taking: Reported on 12/31/2016) 10 tablet 0  . tamsulosin (FLOMAX) 0.4 MG CAPS capsule Take 1 capsule (0.4 mg total) by mouth daily. (Patient not taking: Reported on 12/31/2016) 30 capsule 0   No current facility-administered medications on file prior to visit.     Review of Systems Patient denies any headaches, blurred vision, shortness of breath, chest pain, abdominal pain, problems with bowel movements, urination, or intercourse.  Objective:  BP 122/80   Pulse 90   Ht 5' 4.5" (1.638 m)   Wt 174 lb 9.6 oz (79.2 kg)   BMI 29.51 kg/m  Physical Exam  General:  Well developed, well nourished, no acute distress. She is alert and oriented x3. Skin:  Warm and dry Neck:  Midline trachea, no thyromegaly or nodules Cardiovascular: Regular rate and rhythm, no murmur heard Lungs:  Effort normal, all lung fields clear to auscultation bilaterally Breasts:  No dominant palpable mass, retraction, or nipple discharge, large and pendulous Abdomen:  Soft, non tender, no hepatosplenomegaly or masses Pelvic:  External genitalia is normal in appearance.  The vagina is normal in appearance. The cervix is bulbous, no CMT.  Thin prep pap is done with HR HPV cotesting. Uterus is felt to be normal size, shape, and contour.  No adnexal masses or tenderness noted.Extremities:  No swelling or varicosities noted Psych:  She has a normal mood and affect  Assessment:   Healthy well-woman exam ADD Overweight Shoulder and back pain secondary to large breast size OCP user Depression- under control on current SSRI  Plan:  Medications refilled Labs obtained Recommend evaluation for breast reduction surgery  F/U 1 year for AE, or sooner if needed   Daymen Hassebrock Rockney Ghee, CNM

## 2017-04-22 NOTE — Patient Instructions (Signed)
Preventive Care 18-39 Years, Female Preventive care refers to lifestyle choices and visits with your health care provider that can promote health and wellness. What does preventive care include?  A yearly physical exam. This is also called an annual well check.  Dental exams once or twice a year.  Routine eye exams. Ask your health care provider how often you should have your eyes checked.  Personal lifestyle choices, including: ? Daily care of your teeth and gums. ? Regular physical activity. ? Eating a healthy diet. ? Avoiding tobacco and drug use. ? Limiting alcohol use. ? Practicing safe sex. ? Taking vitamin and mineral supplements as recommended by your health care provider. What happens during an annual well check? The services and screenings done by your health care provider during your annual well check will depend on your age, overall health, lifestyle risk factors, and family history of disease. Counseling Your health care provider may ask you questions about your:  Alcohol use.  Tobacco use.  Drug use.  Emotional well-being.  Home and relationship well-being.  Sexual activity.  Eating habits.  Work and work Statistician.  Method of birth control.  Menstrual cycle.  Pregnancy history.  Screening You may have the following tests or measurements:  Height, weight, and BMI.  Diabetes screening. This is done by checking your blood sugar (glucose) after you have not eaten for a while (fasting).  Blood pressure.  Lipid and cholesterol levels. These may be checked every 5 years starting at age 38.  Skin check.  Hepatitis C blood test.  Hepatitis B blood test.  Sexually transmitted disease (STD) testing.  BRCA-related cancer screening. This may be done if you have a family history of breast, ovarian, tubal, or peritoneal cancers.  Pelvic exam and Pap test. This may be done every 3 years starting at age 38. Starting at age 30, this may be done  every 5 years if you have a Pap test in combination with an HPV test.  Discuss your test results, treatment options, and if necessary, the need for more tests with your health care provider. Vaccines Your health care provider may recommend certain vaccines, such as:  Influenza vaccine. This is recommended every year.  Tetanus, diphtheria, and acellular pertussis (Tdap, Td) vaccine. You may need a Td booster every 10 years.  Varicella vaccine. You may need this if you have not been vaccinated.  HPV vaccine. If you are 39 or younger, you may need three doses over 6 months.  Measles, mumps, and rubella (MMR) vaccine. You may need at least one dose of MMR. You may also need a second dose.  Pneumococcal 13-valent conjugate (PCV13) vaccine. You may need this if you have certain conditions and were not previously vaccinated.  Pneumococcal polysaccharide (PPSV23) vaccine. You may need one or two doses if you smoke cigarettes or if you have certain conditions.  Meningococcal vaccine. One dose is recommended if you are age 68-21 years and a first-year college student living in a residence hall, or if you have one of several medical conditions. You may also need additional booster doses.  Hepatitis A vaccine. You may need this if you have certain conditions or if you travel or work in places where you may be exposed to hepatitis A.  Hepatitis B vaccine. You may need this if you have certain conditions or if you travel or work in places where you may be exposed to hepatitis B.  Haemophilus influenzae type b (Hib) vaccine. You may need this  if you have certain risk factors.  Talk to your health care provider about which screenings and vaccines you need and how often you need them. This information is not intended to replace advice given to you by your health care provider. Make sure you discuss any questions you have with your health care provider. Document Released: 01/04/2002 Document Revised:  07/28/2016 Document Reviewed: 09/09/2015 Elsevier Interactive Patient Education  2017 Elsevier Inc.  

## 2017-04-26 LAB — CYTOLOGY - PAP

## 2017-04-28 ENCOUNTER — Other Ambulatory Visit: Payer: Self-pay

## 2017-05-02 ENCOUNTER — Other Ambulatory Visit: Payer: Self-pay

## 2017-05-04 ENCOUNTER — Telehealth: Payer: Self-pay | Admitting: *Deleted

## 2017-05-04 NOTE — Telephone Encounter (Signed)
Mailed info to pt 

## 2017-05-04 NOTE — Telephone Encounter (Signed)
-----   Message from Joylene Igo, [place] sent at 05/04/2017  2:11 PM EDT ----- Please let her know pap and HPV are both negative

## 2017-05-06 ENCOUNTER — Telehealth: Payer: Self-pay | Admitting: Obstetrics and Gynecology

## 2017-05-06 ENCOUNTER — Other Ambulatory Visit: Payer: BLUE CROSS/BLUE SHIELD

## 2017-05-06 NOTE — Telephone Encounter (Signed)
Patient needs refill on Adderall  Please call

## 2017-05-09 NOTE — Telephone Encounter (Signed)
Lm for pt to call me-ac

## 2017-05-10 LAB — LIPID PANEL
Chol/HDL Ratio: 3.3 ratio (ref 0.0–4.4)
Cholesterol, Total: 202 mg/dL — ABNORMAL HIGH (ref 100–199)
HDL: 61 mg/dL (ref >39)
LDL Calculated: 119 mg/dL — ABNORMAL HIGH (ref 0–99)
Triglycerides: 110 mg/dL (ref 0–149)
VLDL Cholesterol Cal: 22 mg/dL (ref 5–40)

## 2017-05-10 LAB — COMPREHENSIVE METABOLIC PANEL
ALT: 11 IU/L (ref 0–32)
AST: 17 IU/L (ref 0–40)
Albumin/Globulin Ratio: 1.7 (ref 1.2–2.2)
Albumin: 4.4 g/dL (ref 3.5–5.5)
Alkaline Phosphatase: 63 IU/L (ref 39–117)
BUN/Creatinine Ratio: 14 (ref 9–23)
BUN: 11 mg/dL (ref 6–20)
Bilirubin Total: 0.4 mg/dL (ref 0.0–1.2)
CO2: 23 mmol/L (ref 20–29)
Calcium: 9.9 mg/dL (ref 8.7–10.2)
Chloride: 102 mmol/L (ref 96–106)
Creatinine, Ser: 0.81 mg/dL (ref 0.57–1.00)
GFR calc Af Amer: 109 mL/min/{1.73_m2} (ref >59)
GFR calc non Af Amer: 94 mL/min/{1.73_m2} (ref >59)
Globulin, Total: 2.6 g/dL (ref 1.5–4.5)
Glucose: 97 mg/dL (ref 65–99)
Potassium: 4.8 mmol/L (ref 3.5–5.2)
Sodium: 140 mmol/L (ref 134–144)
Total Protein: 7 g/dL (ref 6.0–8.5)

## 2017-05-10 LAB — HIV ANTIBODY (ROUTINE TESTING W REFLEX): HIV Screen 4th Generation wRfx: NONREACTIVE

## 2017-05-10 LAB — HEMOGLOBIN A1C
Est. average glucose Bld gHb Est-mCnc: 100 mg/dL
Hgb A1c MFr Bld: 5.1 % (ref 4.8–5.6)

## 2017-05-10 LAB — RPR: RPR Ser Ql: NONREACTIVE

## 2017-05-10 LAB — HSV 1 AND 2 IGM ABS, INDIRECT
HSV 1 IgM: <1:10 {titer}
HSV 2 IgM: <1:10 {titer}

## 2017-06-06 ENCOUNTER — Telehealth: Payer: Self-pay | Admitting: Obstetrics and Gynecology

## 2017-06-06 ENCOUNTER — Other Ambulatory Visit: Payer: Self-pay | Admitting: *Deleted

## 2017-06-06 MED ORDER — NICOTINE 7 MG/24HR TD PT24
14.0000 mg | MEDICATED_PATCH | Freq: Every day | TRANSDERMAL | 4 refills | Status: DC
Start: 2017-06-06 — End: 2017-08-30

## 2017-06-06 NOTE — Telephone Encounter (Signed)
Please call patient with results

## 2017-06-06 NOTE — Telephone Encounter (Signed)
Left detailed message to return call

## 2017-06-09 ENCOUNTER — Telehealth: Payer: Self-pay | Admitting: Obstetrics and Gynecology

## 2017-06-09 NOTE — Telephone Encounter (Signed)
Patient called and would like for Amy to call her back, Patient did not want to disclose any other information. Please advise.

## 2017-06-13 ENCOUNTER — Telehealth: Payer: Self-pay | Admitting: Obstetrics and Gynecology

## 2017-06-13 ENCOUNTER — Other Ambulatory Visit: Payer: Self-pay | Admitting: Obstetrics and Gynecology

## 2017-06-13 NOTE — Telephone Encounter (Signed)
Patient Called wanting to speak with Tracy Whitaker, Patient stated that she called several times and left messages but we do not have any documentation of that being accurate. Patient would just like to speak with someone and did not disclose any other information. Please advise.

## 2017-06-20 ENCOUNTER — Other Ambulatory Visit: Payer: Self-pay | Admitting: *Deleted

## 2017-06-20 ENCOUNTER — Telehealth: Payer: Self-pay | Admitting: Obstetrics and Gynecology

## 2017-06-20 MED ORDER — AMPHETAMINE-DEXTROAMPHETAMINE 20 MG PO TABS: 20.0000 mg | ORAL_TABLET | Freq: Two times a day (BID) | ORAL | 0 refills | Status: DC

## 2017-06-20 NOTE — Telephone Encounter (Signed)
Patient needs refill on adderall   Please call when ready to pick up

## 2017-06-20 NOTE — Telephone Encounter (Signed)
pls advise

## 2017-06-21 NOTE — Telephone Encounter (Signed)
Notified pt rx was ready

## 2017-06-21 NOTE — Telephone Encounter (Signed)
Done with 3 months refills

## 2017-07-13 ENCOUNTER — Other Ambulatory Visit: Payer: Self-pay | Admitting: Obstetrics and Gynecology

## 2017-07-16 ENCOUNTER — Other Ambulatory Visit: Payer: Self-pay | Admitting: Obstetrics and Gynecology

## 2017-07-26 ENCOUNTER — Other Ambulatory Visit: Payer: Self-pay | Admitting: *Deleted

## 2017-07-26 MED ORDER — DELZICOL 400 MG PO CPDR: 800.0000 mg | DELAYED_RELEASE_CAPSULE | Freq: Two times a day (BID) | ORAL | 4 refills | Status: DC

## 2017-08-22 ENCOUNTER — Other Ambulatory Visit: Payer: Self-pay | Admitting: Unknown Physician Specialty

## 2017-08-22 DIAGNOSIS — M542 Cervicalgia: Secondary | ICD-10-CM

## 2017-08-23 ENCOUNTER — Encounter: Payer: Self-pay | Admitting: *Deleted

## 2017-08-23 ENCOUNTER — Ambulatory Visit
Admission: EM | Admit: 2017-08-23 | Discharge: 2017-08-23 | Disposition: A | Discharge status: Home / Self Care | Payer: BLUE CROSS/BLUE SHIELD | Attending: Family Medicine | Admitting: Family Medicine

## 2017-08-23 DIAGNOSIS — M130 Polyarthritis, unspecified: Secondary | ICD-10-CM

## 2017-08-23 DIAGNOSIS — M2550 Pain in unspecified joint: Secondary | ICD-10-CM

## 2017-08-23 LAB — COMPREHENSIVE METABOLIC PANEL
ALT: 29 U/L (ref 14–54)
AST: 34 U/L (ref 15–41)
Albumin: 3.7 g/dL (ref 3.5–5.0)
Alkaline Phosphatase: 40 U/L (ref 38–126)
Anion gap: 8 (ref 5–15)
BUN: 14 mg/dL (ref 6–20)
CO2: 29 mmol/L (ref 22–32)
Calcium: 8.9 mg/dL (ref 8.9–10.3)
Chloride: 98 mmol/L — ABNORMAL LOW (ref 101–111)
Creatinine, Ser: 0.87 mg/dL (ref 0.44–1.00)
GFR calc Af Amer: >60 mL/min (ref >60)
GFR calc non Af Amer: >60 mL/min (ref >60)
Glucose, Bld: 88 mg/dL (ref 65–99)
Potassium: 4.1 mmol/L (ref 3.5–5.1)
Sodium: 135 mmol/L (ref 135–145)
Total Bilirubin: 0.2 mg/dL — ABNORMAL LOW (ref 0.3–1.2)
Total Protein: 6.7 g/dL (ref 6.5–8.1)

## 2017-08-23 LAB — SEDIMENTATION RATE: Sed Rate: 2 mm/hr (ref 0–20)

## 2017-08-23 LAB — CBC
HCT: 43.5 % (ref 35.0–47.0)
Hemoglobin: 15 g/dL (ref 12.0–16.0)
MCH: 32.6 pg (ref 26.0–34.0)
MCHC: 34.4 g/dL (ref 32.0–36.0)
MCV: 94.9 fL (ref 80.0–100.0)
Platelets: 201 10*3/uL (ref 150–440)
RBC: 4.58 MIL/uL (ref 3.80–5.20)
RDW: 13.2 % (ref 11.5–14.5)
WBC: 10.8 10*3/uL (ref 3.6–11.0)

## 2017-08-23 MED ORDER — TRAMADOL HCL 50 MG PO TABS: 50.0000 mg | ORAL_TABLET | Freq: Three times a day (TID) | ORAL | 0 refills | Status: DC | PRN

## 2017-08-23 MED ORDER — KETOROLAC TROMETHAMINE 60 MG/2ML IM SOLN
60.0000 mg | Freq: Once | INTRAMUSCULAR | Status: AC
  Administered 2017-08-23: 60 mg via INTRAMUSCULAR

## 2017-08-23 NOTE — Discharge Instructions (Signed)
Labs normal.  Tramadol as needed.  I recommend seeing Rheumatology.  Discuss with Melody.  Take care  Dr. Lacinda Axon

## 2017-08-23 NOTE — ED Provider Notes (Signed)
[facility]    CSN: [id number] Arrival date & time: 08/23/17  1552   History   Chief Complaint Chief Complaint  Patient presents with  . Joint Pain  . Joint Swelling   HPI  36 year old female presents with complaints of diffuse joint pain and swelling.  Patient reports that her symptoms started on Wednesday. She states that she began to have pain and swelling in her feet and this progressively upward and is now affecting her hands, wrists, elbows, back, and shoulder blades. She reports associated swelling. No recent illness. No fevers or chills. No reports of redness/warmth. No known inciting factor. No medication changes per her report. No new exposures. Her pain is severe and has been worsening. She reports decreased appetite. She's having difficulty sleeping. No other associated symptoms. No other complaints or concerns at this time.   Past Medical History:  Diagnosis Date  . ADHD (attention deficit hyperactivity disorder)   . History of stomach ulcers   . Polycystic disease, ovaries   . Ulcerative colitis [facility])     Patient Active Problem List   Diagnosis Date Noted  . Hydronephrosis, left 11/06/2016  . ADHD (attention deficit hyperactivity disorder) 04/16/2016  . Obesity (BMI 30-39.9) 04/16/2016    Past Surgical History:  Procedure Laterality Date  . CYSTOSCOPY WITH STENT PLACEMENT Left 11/06/2016   Procedure: CYSTOSCOPY WITH STENT PLACEMENT;  Surgeon: Nickie Retort, MD;  Location: [facility] ORS;  Service: Urology;  Laterality: Left;  . CYSTOSCOPY/URETEROSCOPY/HOLMIUM LASER/STENT PLACEMENT Left 11/27/2016   Procedure: CYSTOSCOPY/URETEROSCOPY/HOLMIUM LASER/STENT  rePLACEMENT;  Surgeon: Hollice Espy, MD;  Location: [facility] ORS;  Service: Urology;  Laterality: Left;  . Surgery for polycystic ovary      OB History    Gravida Para Term Preterm AB Living   1             SAB TAB Ectopic Multiple Live Births                   Home Medications    Prior to  Admission medications   Medication Sig Start Date End Date Taking? Authorizing Provider  acyclovir (ZOVIRAX) 800 MG tablet TAKE 1 TABLET (800 MG TOTAL) BY MOUTH 2 (TWO) TIMES DAILY. 06/13/17   Shambley, Melody N, CNM  amphetamine-dextroamphetamine (ADDERALL) 20 MG tablet Take 1 tablet (20 mg total) by mouth 2 (two) times daily. 06/20/17   Shambley, Melody N, CNM  AZO-CRANBERRY PO Take 1 tablet by mouth.    [provider]  DELZICOL 400 MG CPDR DR capsule Take 2 capsules (800 mg total) by mouth 2 (two) times daily. 07/26/17   Shambley, Melody N, CNM  escitalopram (LEXAPRO) 20 MG tablet Take 1 tablet (20 mg total) by mouth daily. 04/22/17   Shambley, Melody N, CNM  mirabegron ER (MYRBETRIQ) 25 MG TB24 tablet Take 1 tablet (25 mg total) by mouth daily. Patient not taking: Reported on 12/31/2016 11/10/16   Hower, Aaron Mose, MD  nicotine (NICODERM CQ - DOSED IN MG/24 HR) 7 mg/24hr patch Place 2 patches (14 mg total) onto the skin daily. 06/06/17   Shambley, Melody N, CNM  norgestrel-ethinyl estradiol (LO/OVRAL,CRYSELLE) 0.3-30 MG-MCG tablet Take 1 tablet by mouth daily. 04/22/17   Shambley, Melody N, CNM  oxybutynin (DITROPAN) 5 MG tablet Take 1 tablet (5 mg total) by mouth every 8 (eight) hours as needed for bladder spasms. Patient not taking: Reported on 12/31/2016 11/09/16   Hower, Aaron Mose, MD  QUEtiapine (SEROQUEL) 200 MG tablet TAKE 1  TABLET (200 MG TOTAL) BY MOUTH AT BEDTIME. 07/18/17   Shambley, Melody N, CNM  spironolactone (ALDACTONE) 100 MG tablet TAKE 1 TABLET (100 MG TOTAL) BY MOUTH DAILY. 07/13/17   Shambley, Melody N, CNM  tamsulosin (FLOMAX) 0.4 MG CAPS capsule Take 1 capsule (0.4 mg total) by mouth daily. Patient not taking: Reported on 12/31/2016 11/10/16   Hower, Aaron Mose, MD  traMADol (ULTRAM) 50 MG tablet Take 1 tablet (50 mg total) by mouth every 8 (eight) hours as needed. 08/23/17   Coral Spikes, DO    Family History Family History  Problem Relation Age of Onset  . Kidney Stones Father    . Kidney cancer Neg Hx   . Kidney disease Neg Hx   . Prostate cancer Neg Hx     Social History Social History  Substance Use Topics  . Smoking status: Current Every Day Smoker    Packs/day: 1.00    Types: Cigarettes  . Smokeless tobacco: Never Used  . Alcohol use 2.4 oz/week    4 Standard drinks or equivalent per week     Allergies   Latex   Review of Systems Review of Systems  Constitutional: Negative.   Gastrointestinal:       Decreased appetite.   Musculoskeletal: Positive for arthralgias and joint swelling.  Psychiatric/Behavioral: Positive for sleep disturbance.  All other systems reviewed and are negative.  Physical Exam Triage Vital Signs ED Triage Vitals  Enc Vitals Group     BP 08/23/17 1608 114/82     Pulse Rate 08/23/17 1608 99     Resp 08/23/17 1608 16     Temp 08/23/17 1608 98.5 F (36.9 C)     Temp Source 08/23/17 1608 Oral     SpO2 08/23/17 1608 100 %     Weight 08/23/17 1610 180 lb (81.6 kg)     Height 08/23/17 1610 '5\' 4"'  (1.626 m)     Head Circumference --      Peak Flow --      Pain Score 08/23/17 1611 8     Pain Loc --      Pain Edu? --      Excl. in [place]? --    Updated Vital Signs BP 114/82 (BP Location: Left Arm)   Pulse 99   Temp 98.5 F (36.9 C) (Oral)   Resp 16   Ht '5\' 4"'  (1.626 m)   Wt 180 lb (81.6 kg)   SpO2 100%   BMI 30.90 kg/m   Physical Exam  Constitutional: She is oriented to person, place, and time. She appears well-developed. No distress.  HENT:  Head: Normocephalic and atraumatic.  Eyes: Conjunctivae are normal. No scleral icterus.  Neck: Neck supple.  Cardiovascular: Normal rate and regular rhythm.   Pulmonary/Chest: Effort normal and breath sounds normal. No respiratory distress.  Musculoskeletal:  No evidence of synovitis of the joints. There is abnormal range of motion of all of her joints. She does plan to pain with range of motion.  Lymphadenopathy:    She has no cervical adenopathy.  Neurological: She  is alert and oriented to person, place, and time.  Skin: Skin is warm. No rash noted. No erythema.  Psychiatric:  Flat affect. Tearful.  Vitals reviewed.  UC Treatments / Results  Labs (all labs ordered are listed, but only abnormal results are displayed) Labs Reviewed  COMPREHENSIVE METABOLIC PANEL - Abnormal; Notable for the following:       Result Value   Chloride 98 (*)  Total Bilirubin 0.2 (*)    All other components within normal limits  CBC  SEDIMENTATION RATE    EKG  EKG Interpretation None       Radiology No results found.  Procedures Procedures (including critical care time)  Medications Ordered in UC Medications  ketorolac (TORADOL) injection 60 mg (60 mg Intramuscular Given 08/23/17 1634)    Initial Impression / Assessment and Plan / UC Course  I have reviewed the triage vital signs and the nursing notes.  Pertinent labs & imaging results that were available during my care of the patient were reviewed by me and considered in my medical decision making (see chart for details).    36 year old female presents with diffuse joint aches and pains. Exam unrevealing.Labs unremarkable with a negative ESR. I sincerely doubt that she has an underlying rheumatologic cause given exam and negative ESR but I recommend that she discuss with primary care and see rheumatology given her symptoms. Tramadol as needed for pain. Patient was given prescription and states that tramadol does not help. This will be all I will prescribe regarding opioids. Work note given.  Final Clinical Impressions(s) / UC Diagnoses   Final diagnoses:  Polyarthralgia    New Prescriptions Current Discharge Medication List    START taking these medications   Details  traMADol (ULTRAM) 50 MG tablet Take 1 tablet (50 mg total) by mouth every 8 (eight) hours as needed. Qty: 15 tablet, Refills: 0       Controlled Substance Prescriptions [place] Controlled Substance Registry consulted? Yes, I have  consulted the [place] Controlled Substances Registry for this patient. She was recent prescribed 3 days of tramadol. No other concerns are red flags. Okay with prescription.   Coral Spikes, [place] 08/23/17 1735

## 2017-08-23 NOTE — ED Triage Notes (Signed)
Generalized joint pain and edema x2 days.

## 2017-08-25 DIAGNOSIS — R918 Other nonspecific abnormal finding of lung field: Secondary | ICD-10-CM | POA: Insufficient documentation

## 2017-08-25 HISTORY — DX: Other nonspecific abnormal finding of lung field: R91.8

## 2017-08-26 ENCOUNTER — Ambulatory Visit
Admission: RE | Admit: 2017-08-26 | Discharge: 2017-08-26 | Disposition: A | Discharge status: Home / Self Care | Payer: BLUE CROSS/BLUE SHIELD | Source: Ambulatory Visit | Attending: Unknown Physician Specialty | Admitting: Unknown Physician Specialty

## 2017-08-26 DIAGNOSIS — R221 Localized swelling, mass and lump, neck: Secondary | ICD-10-CM | POA: Insufficient documentation

## 2017-08-26 DIAGNOSIS — M542 Cervicalgia: Secondary | ICD-10-CM

## 2017-08-26 MED ORDER — IOPAMIDOL (ISOVUE-300) INJECTION 61%
75.0000 mL | Freq: Once | INTRAVENOUS | Status: AC | PRN
  Administered 2017-08-26: 75 mL via INTRAVENOUS

## 2017-08-29 ENCOUNTER — Telehealth: Payer: Self-pay | Admitting: Obstetrics and Gynecology

## 2017-08-29 NOTE — Telephone Encounter (Signed)
pls advise

## 2017-08-29 NOTE — Telephone Encounter (Signed)
Patient called and stated that she was seen in the ED, the patient stated that they did a CT scan and found a "nogel" on her right lung. The patient would like to have patches prescribed to help her quit smoking. No other information was disclosed other than wanting to speak with Amy. Please advise.

## 2017-08-30 ENCOUNTER — Other Ambulatory Visit: Payer: Self-pay | Admitting: Obstetrics and Gynecology

## 2017-08-30 MED ORDER — NICOTINE 14 MG/24HR TD PT24: 14.0000 mg | MEDICATED_PATCH | Freq: Every day | TRANSDERMAL | 0 refills | Status: DC

## 2017-08-30 MED ORDER — NICOTINE 21 MG/24HR TD PT24: 21.0000 mg | MEDICATED_PATCH | Freq: Every day | TRANSDERMAL | 0 refills | Status: DC

## 2017-08-30 MED ORDER — NICOTINE 7 MG/24HR TD PT24: 14.0000 mg | MEDICATED_PATCH | Freq: Every day | TRANSDERMAL | 4 refills | Status: DC

## 2017-08-30 NOTE — Telephone Encounter (Signed)
Done-ac 

## 2017-08-30 NOTE — Telephone Encounter (Signed)
Done, tell her to start with higher dose patch and wean down every 10-14 days

## 2017-08-31 DIAGNOSIS — M2550 Pain in unspecified joint: Secondary | ICD-10-CM

## 2017-08-31 DIAGNOSIS — M791 Myalgia, unspecified site: Secondary | ICD-10-CM | POA: Insufficient documentation

## 2017-08-31 HISTORY — DX: Pain in unspecified joint: M25.50

## 2017-09-05 ENCOUNTER — Telehealth: Payer: Self-pay | Admitting: Obstetrics and Gynecology

## 2017-09-05 NOTE — Telephone Encounter (Signed)
Patient called and stated that she had a prescription sent to her pharmacy (a patch)and the patients insurance will not cover it, The patient would like someone to call in [facility]) instead. The patients preferred pharmacy is [facility] on [address]. No other information was disclosed other than wanting to speak with Amy as well. Please advise.

## 2017-09-06 ENCOUNTER — Other Ambulatory Visit: Payer: Self-pay | Admitting: Obstetrics and Gynecology

## 2017-09-06 MED ORDER — VARENICLINE TARTRATE 0.5 MG PO TABS: 0.5000 mg | ORAL_TABLET | Freq: Two times a day (BID) | ORAL | 6 refills | Status: DC

## 2017-09-06 NOTE — Telephone Encounter (Signed)
pls advise

## 2017-09-06 NOTE — Telephone Encounter (Signed)
Done- but not sure if insurance will cover, and remind her it can cause strong and vivid dreams, especially if takes pm dose. So I have some people that just take one pill in the mrnings only.

## 2017-09-07 NOTE — Telephone Encounter (Signed)
Done-ac 

## 2017-09-12 ENCOUNTER — Telehealth: Payer: Self-pay | Admitting: *Deleted

## 2017-09-12 NOTE — Telephone Encounter (Signed)
Copied from [place] #[id number]. Topic: Quick Communication - See Telephone Encounter >> Sep 12, 2017 11:55 AM Aurelio Brash B wrote: CRM for notification. See Telephone encounter for:  09/12/17. New pt has xrays that she would like to email, pt already has apt.

## 2017-09-12 NOTE — Telephone Encounter (Signed)
Spoke with [facility] and she suggested giving fax number. Left message with pt including the fax number to fax xray.

## 2017-09-15 ENCOUNTER — Ambulatory Visit (INDEPENDENT_AMBULATORY_CARE_PROVIDER_SITE_OTHER): Payer: BLUE CROSS/BLUE SHIELD | Admitting: Family Medicine

## 2017-09-15 ENCOUNTER — Encounter: Payer: Self-pay | Admitting: Family Medicine

## 2017-09-15 VITALS — BP 103/70 | HR 83 | Temp 98.7°F | Ht 64.5 in | Wt 189.8 lb

## 2017-09-15 DIAGNOSIS — K5190 Ulcerative colitis, unspecified, without complications: Secondary | ICD-10-CM | POA: Insufficient documentation

## 2017-09-15 DIAGNOSIS — Z716 Tobacco abuse counseling: Secondary | ICD-10-CM | POA: Diagnosis not present

## 2017-09-15 DIAGNOSIS — F419 Anxiety disorder, unspecified: Secondary | ICD-10-CM

## 2017-09-15 DIAGNOSIS — F909 Attention-deficit hyperactivity disorder, unspecified type: Secondary | ICD-10-CM

## 2017-09-15 DIAGNOSIS — E282 Polycystic ovarian syndrome: Secondary | ICD-10-CM

## 2017-09-15 DIAGNOSIS — F329 Major depressive disorder, single episode, unspecified: Secondary | ICD-10-CM

## 2017-09-15 DIAGNOSIS — G4700 Insomnia, unspecified: Secondary | ICD-10-CM

## 2017-09-15 DIAGNOSIS — F32A Depression, unspecified: Secondary | ICD-10-CM

## 2017-09-15 DIAGNOSIS — K51919 Ulcerative colitis, unspecified with unspecified complications: Secondary | ICD-10-CM | POA: Diagnosis not present

## 2017-09-15 DIAGNOSIS — R300 Dysuria: Secondary | ICD-10-CM

## 2017-09-15 DIAGNOSIS — Z7689 Persons encountering health services in other specified circumstances: Secondary | ICD-10-CM

## 2017-09-15 DIAGNOSIS — R911 Solitary pulmonary nodule: Secondary | ICD-10-CM

## 2017-09-15 DIAGNOSIS — R109 Unspecified abdominal pain: Secondary | ICD-10-CM | POA: Diagnosis not present

## 2017-09-15 DIAGNOSIS — R6884 Jaw pain: Secondary | ICD-10-CM

## 2017-09-15 MED ORDER — TRAZODONE HCL 50 MG PO TABS: 25.0000 mg | ORAL_TABLET | Freq: Every evening | ORAL | 3 refills | Status: DC | PRN

## 2017-09-15 MED ORDER — METFORMIN HCL ER 500 MG PO TB24: 500.0000 mg | ORAL_TABLET | Freq: Every day | ORAL | 3 refills | Status: DC

## 2017-09-15 MED ORDER — NITROFURANTOIN MONOHYD MACRO 100 MG PO CAPS: 100.0000 mg | ORAL_CAPSULE | Freq: Two times a day (BID) | ORAL | 0 refills | Status: DC

## 2017-09-15 MED ORDER — BUPROPION HCL ER (SMOKING DET) 150 MG PO TB12: 150.0000 mg | ORAL_TABLET | Freq: Two times a day (BID) | ORAL | 1 refills | Status: DC

## 2017-09-15 NOTE — Progress Notes (Signed)
BP 103/70 (BP Location: Left Arm, Patient Position: Sitting, Cuff Size: Normal)   Pulse 83   Temp 98.7 F (37.1 C)   Ht 5' 4.5" (1.638 m)   Wt 189 lb 12.8 oz (86.1 kg)   SpO2 97%   BMI 32.08 kg/m    Subjective:    Patient ID: Tracy Whitaker, female    DOB: 1980-12-14, 36 y.o.   MRN: [id number]  HPI: Tracy Whitaker is a 36 y.o. female  Chief Complaint  Patient presents with  . Establish Care  . Lung Lesion    Right lung  . Eagle Syndrome  . Dysuria  . Flank Pain   Patient presents today to establish care. Has several concerns she would like to discuss today.   Hx of depression and anxiety, was previously on lexapro and seroquel with poor relief. Still waking up several times each night, and still having breakthrough anxiety and depression sxs.   Lung nodule of right lung discovered as incidental finding during recent ER visit - 12 month f/u CT recommended per Radiology. Has been smoking for 15 years, .5 packs every day. Wanting badly to quit. Was previously on nicoderm patches with no relief. Has not tried medications, interested in chantix. Denies CP, SOB, coughing.  Urinary sxs. Hx of kidney stones and UTIs. Left flank pain, urinary frequency, dysuria, and vaginal discharge x 5 days. Had some leftover clindamycin that she's been taking x 3 days. Denies fevers, chills, hematuria.   Had annual exam 3 months ago with GYN. UTD on health maintenance.   Was diagnosed with Ulcerative Colitis at age 61. Currently well controlled on delzicol - has been able to taper down on the medication, has not had a flare in years. Seems to do well with avoiding food triggers now that she has identified what most of them are.   Having significant jaw pain and swelling after a painful cyst came up behind left ear. 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 Wanting further eval of this issue.   Past Medical History:  Diagnosis  Date  . ADHD (attention deficit hyperactivity disorder)   . History of stomach ulcers   . Myalgia   . Polycystic disease, ovaries   . Pulmonary nodules 08/25/2017   right lung, repeat imaging in 1 year  . Ulcerative colitis [facility])    Social History   Social History  . Marital status: Single    Spouse name: N/A  . Number of children: N/A  . Years of education: N/A   Occupational History  . Not on file.   Social History Main Topics  . Smoking status: Current Every Day Smoker    Packs/day: 1.00    Types: Cigarettes  . Smokeless tobacco: Never Used  . Alcohol use 2.4 oz/week    4 Standard drinks or equivalent per week  . Drug use: No  . Sexual activity: Yes    Birth control/ protection: Pill   Other Topics Concern  . Not on file   Social History Narrative  . No narrative on file    Relevant past medical, surgical, family and social history reviewed and updated as indicated. Interim medical history since our last visit reviewed. Allergies and medications reviewed and updated.  Review of Systems  Constitutional: Negative.   HENT:       Jaw pain  Eyes: Negative.   Respiratory: Negative.   Cardiovascular: Negative.   Gastrointestinal: Negative.  Genitourinary: Positive for dysuria, flank pain, frequency and urgency.  Neurological: Negative.   Psychiatric/Behavioral: Positive for dysphoric mood and sleep disturbance. The patient is nervous/anxious.    Per HPI unless specifically indicated above     Objective:    BP 103/70 (BP Location: Left Arm, Patient Position: Sitting, Cuff Size: Normal)   Pulse 83   Temp 98.7 F (37.1 C)   Ht 5' 4.5" (1.638 m)   Wt 189 lb 12.8 oz (86.1 kg)   SpO2 97%   BMI 32.08 kg/m   Wt Readings from Last 3 Encounters:  09/15/17 189 lb 12.8 oz (86.1 kg)  08/23/17 180 lb (81.6 kg)  04/22/17 174 lb 9.6 oz (79.2 kg)    Physical Exam  Constitutional: She is oriented to person, place, and time. She appears well-developed and  well-nourished. No distress.  HENT:  Head: Atraumatic.  Left jaw tenderness to palpation with crepitus during ROM  Eyes: Conjunctivae are normal. No scleral icterus.  Neck: Normal range of motion. Neck supple.  Cardiovascular: Normal rate and normal heart sounds.   Pulmonary/Chest: Effort normal and breath sounds normal. No respiratory distress.  Abdominal: Soft. Bowel sounds are normal. She exhibits no distension.  Musculoskeletal: Normal range of motion. She exhibits no tenderness (No CVA tenderness b/l).  Neurological: She is alert and oriented to person, place, and time.  Skin: Skin is warm and dry.  Psychiatric: She has a normal mood and affect. Her behavior is normal.  Nursing note and vitals reviewed.     Assessment & Plan:   Problem List Items Addressed This Visit      Digestive   Ulcerative colitis ([place])    Well controlled, continue current regimen and food trigger avoidance        Endocrine   Polycystic disease, ovaries    Stable on OCPs, pt wanting to try metformin to help regulate cycles and weight. Will start 500 mg XR and recheck in 1 month        Other   ADHD (attention deficit hyperactivity disorder)    Stable on adderall, managed by GYN      Insomnia    Will start trazodone for sleep. Sleep hygiene reviewed, melatonin, essential oils.       Anxiety and depression    Starting zyban for smoking cessation and trazodone for sleep. D/c seroquel and lexapro. Will monitor closely for response. Risks and benefits reviewed.       Relevant Medications   traZODone (DESYREL) 50 MG tablet    Other Visit Diagnoses    Flank pain    -  Primary   Relevant Orders   UA/M w/rflx Culture, Routine (Completed)   Encounter to establish care       Dysuria       Small amount of bacteria found on U/A despite several days of self dosed abx at home. Will treat with macrobid and await cx.    Relevant Orders   UA/M w/rflx Culture, Routine (Completed)   Jaw pain        Relevant Orders   Ambulatory referral to ENT   Encounter for smoking cessation counseling       Will start zyban 150 mg. Monitor closely. Discussed free [facility] cessation classes.    Lung nodule       Continue to monitor. Will repeat chest CT in 1 year       Follow up plan: Return in about 4 weeks (around 10/13/2017) for Smoking cessation, sleep.

## 2017-09-18 DIAGNOSIS — F32A Depression, unspecified: Secondary | ICD-10-CM | POA: Insufficient documentation

## 2017-09-18 DIAGNOSIS — F419 Anxiety disorder, unspecified: Secondary | ICD-10-CM

## 2017-09-18 DIAGNOSIS — G4700 Insomnia, unspecified: Secondary | ICD-10-CM | POA: Insufficient documentation

## 2017-09-18 DIAGNOSIS — F329 Major depressive disorder, single episode, unspecified: Secondary | ICD-10-CM | POA: Insufficient documentation

## 2017-09-18 HISTORY — DX: Anxiety disorder, unspecified: F41.9

## 2017-09-18 HISTORY — DX: Depression, unspecified: F32.A

## 2017-09-18 NOTE — Patient Instructions (Signed)
Follow up in 1 month   

## 2017-09-18 NOTE — Assessment & Plan Note (Signed)
Well controlled, continue current regimen and food trigger avoidance

## 2017-09-18 NOTE — Assessment & Plan Note (Signed)
Will start trazodone for sleep. Sleep hygiene reviewed, melatonin, essential oils.

## 2017-09-18 NOTE — Assessment & Plan Note (Signed)
Stable on adderall, managed by GYN

## 2017-09-18 NOTE — Assessment & Plan Note (Signed)
Stable on OCPs, pt wanting to try metformin to help regulate cycles and weight. Will start 500 mg XR and recheck in 1 month

## 2017-09-18 NOTE — Assessment & Plan Note (Signed)
Starting zyban for smoking cessation and trazodone for sleep. D/c seroquel and lexapro. Will monitor closely for response. Risks and benefits reviewed.

## 2017-09-19 LAB — UA/M W/RFLX CULTURE, ROUTINE
Bilirubin, UA: NEGATIVE
Glucose, UA: NEGATIVE
Ketones, UA: NEGATIVE
Nitrite, UA: NEGATIVE
Protein, UA: NEGATIVE
Specific Gravity, UA: 1.015 (ref 1.005–1.030)
Urobilinogen, Ur: 0.2 mg/dL (ref 0.2–1.0)
pH, UA: 7 (ref 5.0–7.5)

## 2017-09-19 LAB — URINE CULTURE, REFLEX

## 2017-09-19 LAB — MICROSCOPIC EXAMINATION

## 2017-09-20 ENCOUNTER — Encounter: Payer: Self-pay | Admitting: Family Medicine

## 2017-09-22 ENCOUNTER — Telehealth: Payer: Self-pay | Admitting: Family Medicine

## 2017-09-22 NOTE — Telephone Encounter (Signed)
Copied from [place] #[id number]. Topic: Quick Communication - See Telephone Encounter >> Sep 22, 2017  3:26 PM Tracy Whitaker wrote: CRM for notification. See Telephone encounter for:  09/22/17. Pt said needed  Referral for ENT Dr. Terie Purser at [place] office.  But went to Dr. Tami Ribas at  [facility]

## 2017-09-23 NOTE — Telephone Encounter (Signed)
Pt called back and gave her number for [facility]

## 2017-09-23 NOTE — Telephone Encounter (Signed)
All referrals for ENT go to the [place] office for scheduling. Patient can call and reschedule her appointment for [place] location if she prefers.  Dr. Kathyrn Sheriff is part of [facility].  [facility]: [phone].  LVM for patient to return phone call.

## 2017-09-27 ENCOUNTER — Telehealth: Payer: Self-pay | Admitting: Obstetrics and Gynecology

## 2017-09-27 NOTE — Telephone Encounter (Signed)
Patient needs refill on her Adderall - please call when its ready to pick up

## 2017-09-28 ENCOUNTER — Other Ambulatory Visit: Payer: Self-pay | Admitting: *Deleted

## 2017-09-28 MED ORDER — AMPHETAMINE-DEXTROAMPHETAMINE 20 MG PO TABS: 20.0000 mg | ORAL_TABLET | Freq: Two times a day (BID) | ORAL | 0 refills | Status: DC

## 2017-09-28 NOTE — Telephone Encounter (Signed)
Left VM for pt rx ready for pick up

## 2017-10-15 ENCOUNTER — Other Ambulatory Visit: Payer: Self-pay | Admitting: Obstetrics and Gynecology

## 2017-10-21 ENCOUNTER — Ambulatory Visit (INDEPENDENT_AMBULATORY_CARE_PROVIDER_SITE_OTHER): Payer: BLUE CROSS/BLUE SHIELD | Admitting: Family Medicine

## 2017-10-21 ENCOUNTER — Encounter: Payer: Self-pay | Admitting: Family Medicine

## 2017-10-21 VITALS — BP 95/62 | HR 80 | Temp 98.6°F | Wt 189.9 lb

## 2017-10-21 DIAGNOSIS — Z23 Encounter for immunization: Secondary | ICD-10-CM | POA: Diagnosis not present

## 2017-10-21 DIAGNOSIS — Z716 Tobacco abuse counseling: Secondary | ICD-10-CM | POA: Diagnosis not present

## 2017-10-21 DIAGNOSIS — G4700 Insomnia, unspecified: Secondary | ICD-10-CM | POA: Diagnosis not present

## 2017-10-21 DIAGNOSIS — J029 Acute pharyngitis, unspecified: Secondary | ICD-10-CM

## 2017-10-21 MED ORDER — ACYCLOVIR 800 MG PO TABS: 800.0000 mg | ORAL_TABLET | Freq: Two times a day (BID) | ORAL | 3 refills | Status: DC

## 2017-10-21 MED ORDER — SPIRONOLACTONE 100 MG PO TABS: 100.0000 mg | ORAL_TABLET | Freq: Every day | ORAL | 3 refills | Status: DC

## 2017-10-21 MED ORDER — NORGESTREL-ETHINYL ESTRADIOL 0.3-30 MG-MCG PO TABS: 1.0000 | ORAL_TABLET | Freq: Every day | ORAL | 4 refills | Status: DC

## 2017-10-21 MED ORDER — TRAZODONE HCL 50 MG PO TABS: 25.0000 mg | ORAL_TABLET | Freq: Every evening | ORAL | 3 refills | Status: DC | PRN

## 2017-10-21 MED ORDER — DELZICOL 400 MG PO CPDR: 800.0000 mg | DELAYED_RELEASE_CAPSULE | Freq: Two times a day (BID) | ORAL | 3 refills | Status: DC

## 2017-10-21 MED ORDER — METFORMIN HCL ER 500 MG PO TB24: 500.0000 mg | ORAL_TABLET | Freq: Every day | ORAL | 3 refills | Status: DC

## 2017-10-21 MED ORDER — VARENICLINE TARTRATE 0.5 MG X 11 & 1 MG X 42 PO MISC: ORAL | 0 refills | Status: DC

## 2017-10-21 NOTE — Assessment & Plan Note (Signed)
Excellent results with the trazodone, continue current regimen

## 2017-10-21 NOTE — Progress Notes (Signed)
BP 95/62 (BP Location: Right Arm, Patient Position: Sitting, Cuff Size: Normal)   Pulse 80   Temp 98.6 F (37 C) (Oral)   Wt 189 lb 14.4 oz (86.1 kg)   SpO2 97%   BMI 32.09 kg/m    Subjective:    Patient ID: Tracy Whitaker, female    DOB: 17-Jan-1981, 36 y.o.   MRN: [id number]  HPI: TALAYLA DOYEL is a 36 y.o. female  Chief Complaint  Patient presents with  . Follow-up  . Nicotine Dependence    Patient has cut back a lot smoking she states. Still may need to try chantix.  . Insomnia    Patient doing much better on trazadone.   Patient presents today for 1 month f/u insomnia and smoking cessation. Taking 1 tab of trazodone nightly which has helped with her sleep tremendously without giving her side effects. Very happy with this medicine, wanting to continue.   Started wellbutrin for smoking cessation, taking just in the mornings because it gives her nightmares with the night dose. Was able to cut back to half a pack daily from a whole pack but does not feel that she can tolerate the side effects (vivid nightmares) long enough to actually help her quit. Ready to try the chantix.   Also having lots of post nasal drainage and getting over a cold. Feels like her throat is slightly inflamed and her tongue is swollen. Has not tried anything OTC for sxs.   Past Medical History:  Diagnosis Date  . ADHD (attention deficit hyperactivity disorder)   . History of stomach ulcers   . Myalgia   . Polycystic disease, ovaries   . Pulmonary nodules 08/25/2017   right lung, repeat imaging in 1 year  . Ulcerative colitis ([place])    Social History   Socioeconomic History  . Marital status: Single    Spouse name: Not on file  . Number of children: Not on file  . Years of education: Not on file  . Highest education level: Not on file  Social Needs  . Financial resource strain: Not on file  . Food insecurity - worry: Not on file  . Food insecurity - inability: Not on file  .  Transportation needs - medical: Not on file  . Transportation needs - non-medical: Not on file  Occupational History  . Not on file  Tobacco Use  . Smoking status: Current Every Day Smoker    Packs/day: 1.00    Types: Cigarettes  . Smokeless tobacco: Never Used  Substance and Sexual Activity  . Alcohol use: Yes    Alcohol/week: 2.4 oz    Types: 4 Standard drinks or equivalent per week  . Drug use: No  . Sexual activity: Yes    Birth control/protection: Pill  Other Topics Concern  . Not on file  Social History Narrative  . Not on file   Relevant past medical, surgical, family and social history reviewed and updated as indicated. Interim medical history since our last visit reviewed. Allergies and medications reviewed and updated.  Review of Systems  Constitutional: Negative.   HENT: Positive for postnasal drip and sore throat.        Tongue swelling  Respiratory: Negative.   Cardiovascular: Negative.   Gastrointestinal: Negative.   Genitourinary: Negative.   Musculoskeletal: Negative.   Neurological: Negative.   Psychiatric/Behavioral: Negative.     Per HPI unless specifically indicated above     Objective:    BP 95/62 (BP Location: Right  Arm, Patient Position: Sitting, Cuff Size: Normal)   Pulse 80   Temp 98.6 F (37 C) (Oral)   Wt 189 lb 14.4 oz (86.1 kg)   SpO2 97%   BMI 32.09 kg/m   Wt Readings from Last 3 Encounters:  10/21/17 189 lb 14.4 oz (86.1 kg)  09/15/17 189 lb 12.8 oz (86.1 kg)  08/23/17 180 lb (81.6 kg)    Physical Exam  Constitutional: She is oriented to person, place, and time. She appears well-developed and well-nourished. No distress.  HENT:  Head: Atraumatic.  Eyes: Conjunctivae are normal. Pupils are equal, round, and reactive to light.  Neck: Normal range of motion. Neck supple.  Cardiovascular: Normal rate, regular rhythm and normal heart sounds.  Pulmonary/Chest: Effort normal and breath sounds normal.  Musculoskeletal: Normal range  of motion.  Neurological: She is alert and oriented to person, place, and time.  Skin: Skin is warm and dry. No rash noted.  Psychiatric: She has a normal mood and affect. Her behavior is normal.  Nursing note and vitals reviewed.     Assessment & Plan:   Problem List Items Addressed This Visit      Other   Insomnia    Excellent results with the trazodone, continue current regimen       Other Visit Diagnoses    Need for influenza vaccination    -  Primary   Relevant Orders   Flu Vaccine QUAD 6+ mos PF IM (Fluarix Quad PF) (Completed)   Encounter for smoking cessation counseling       Side effects with zyban, d/c and start chantix. Risks discussed, pt agreeable. Will monitor closely for benefit. F/u in 1 month   Sore throat       Likely from constant post-nasal drainage. Start zyrtec and flonase BID, salt water gargles. F/u if no improvement       Follow up plan: Return in about 4 weeks (around 11/18/2017).

## 2017-10-21 NOTE — Patient Instructions (Signed)
F/u in 1 month

## 2017-10-26 ENCOUNTER — Telehealth: Payer: Self-pay | Admitting: Family Medicine

## 2017-10-26 NOTE — Telephone Encounter (Signed)
Pt called because she would like to increase her dose of Aldactone because she is still having issues with her Polycystic ovary disease: excess hair; pt would like to have a 3 month supply; she uses [facility] on [address] in [place]; will route to [employer] for disposition.

## 2017-11-02 ENCOUNTER — Telehealth: Payer: Self-pay | Admitting: Family Medicine

## 2017-11-02 NOTE — Telephone Encounter (Signed)
I'm not comfortable increasing her spironolactone at this time as both times I've seen her, her BPs have been quite low (21V-IFX 252V systolic) so it would not be safe to increase.

## 2017-11-02 NOTE — Telephone Encounter (Signed)
Routed to pcp

## 2017-11-02 NOTE — Telephone Encounter (Signed)
Tried calling patient. It states that the party is not available and to please try call again later.  Will call back tomorrow.

## 2017-11-02 NOTE — Telephone Encounter (Signed)
Copied from [place]. Topic: Quick Communication - Rx Refill/Question >> Nov 02, 2017  3:11 PM Scherrie Gerlach wrote: Has the patient contacted their pharmacy? yes  Pt states her old dr wrote this rx for amphetamine-dextroamphetamine (ADDERALL) 20 MG tablet But the dr at the old practice wrote the wrong date on the rx and that dr will not be back until next week. So pharmacy will not fill the script.  Pt states she has had a hard time out to the other office trying to get corrected  Pt states she would like Merrie Roof to write this rx, but will be the first time, since she is new with [facility].

## 2017-11-02 NOTE — Telephone Encounter (Signed)
Pt is calling back because she has not heard anything from the dr about an increase.

## 2017-11-03 MED ORDER — AMPHETAMINE-DEXTROAMPHETAMINE 20 MG PO TABS: 20.0000 mg | ORAL_TABLET | Freq: Two times a day (BID) | ORAL | 0 refills | Status: DC

## 2017-11-03 NOTE — Telephone Encounter (Signed)
Patient called again waiting on prescription for her Adderall. She said she was told it would be rushed through because she called last week for it.

## 2017-11-03 NOTE — Telephone Encounter (Signed)
Routing to provider  

## 2017-11-03 NOTE — Telephone Encounter (Signed)
Will send her 1 week supply until Melody is back in office to fix previous rx. Rx printed and signed

## 2017-11-03 NOTE — Telephone Encounter (Signed)
Spoke with Amy from [facility] and patient is correct. Patient notified that a week supply given till [place] gets back.

## 2017-11-03 NOTE — Telephone Encounter (Signed)
Patient notified

## 2017-11-08 ENCOUNTER — Telehealth: Payer: Self-pay | Admitting: Family Medicine

## 2017-11-08 NOTE — Telephone Encounter (Signed)
Patient would like to have someone call her regarding the script for her adderrall 61m   She picked up the script for the 5 day supply but still needs to speak to someone regarding her issue she is having with it.  Cell [phone] Thank you

## 2017-11-09 MED ORDER — AMPHETAMINE-DEXTROAMPHETAMINE 20 MG PO TABS: 20.0000 mg | ORAL_TABLET | Freq: Two times a day (BID) | ORAL | 0 refills | Status: DC

## 2017-11-09 NOTE — Telephone Encounter (Signed)
Patient explained to me that I could leave a detailed message on her cell because she was working. Left message for patient Rx is ready but to bring in her old Rx to be discarded.  Routing to front desk as [place] patient must bring in her old Rx for the 18th till we can give her the one from [facility].

## 2017-11-09 NOTE — Telephone Encounter (Signed)
Spoke with patient.  She stated that she had her Rx that was written to be filled on the 18th but there was something wrong with the dates on the Rx so she couldn't. She stated that she would rather get her Rx from [facility] because [facility] is her PCP and she doesn't want to go to 2 offices for her medications. That's why she established care. I explained to patient I would send [facility] a message regarding and see what she says.

## 2017-11-09 NOTE — Telephone Encounter (Signed)
I was not aware that her intention was for me to take over management of her ADHD - she told me this was managed by Melody. I will give her a month's supply and we can discuss getting set up to transfer this here at her upcoming visit. She needs to bring in the old script dated for the 18th so it can be destroyed

## 2017-11-09 NOTE — Telephone Encounter (Signed)
Just for clarification and just to explain to patient, Tracy Whitaker only called that in till [name] got back in the office. Routing to provider to advise.

## 2017-11-09 NOTE — Telephone Encounter (Signed)
Correct, this was only to get her through until her actual prescribing provider got back into town and could resolve the issue

## 2017-11-14 ENCOUNTER — Encounter: Payer: Self-pay | Admitting: Family Medicine

## 2017-11-14 ENCOUNTER — Ambulatory Visit (INDEPENDENT_AMBULATORY_CARE_PROVIDER_SITE_OTHER): Payer: BLUE CROSS/BLUE SHIELD | Admitting: Family Medicine

## 2017-11-14 VITALS — BP 103/73 | HR 81 | Wt 190.0 lb

## 2017-11-14 DIAGNOSIS — N390 Urinary tract infection, site not specified: Secondary | ICD-10-CM | POA: Diagnosis not present

## 2017-11-14 DIAGNOSIS — B9689 Other specified bacterial agents as the cause of diseases classified elsewhere: Secondary | ICD-10-CM | POA: Diagnosis not present

## 2017-11-14 DIAGNOSIS — N760 Acute vaginitis: Secondary | ICD-10-CM

## 2017-11-14 LAB — WET PREP FOR TRICH, YEAST, CLUE
Clue Cell Exam: POSITIVE — AB
Trichomonas Exam: NEGATIVE
Yeast Exam: NEGATIVE

## 2017-11-14 MED ORDER — CIPROFLOXACIN HCL 250 MG PO TABS: 250.0000 mg | ORAL_TABLET | Freq: Two times a day (BID) | ORAL | 0 refills | Status: DC

## 2017-11-14 MED ORDER — CLINDAMYCIN HCL 150 MG PO CAPS: 150.0000 mg | ORAL_CAPSULE | Freq: Two times a day (BID) | ORAL | 0 refills | Status: DC

## 2017-11-14 NOTE — Progress Notes (Signed)
BP 103/73   Pulse 81   Wt 190 lb (86.2 kg)   SpO2 98%   BMI 32.11 kg/m    Subjective:    Patient ID: Tracy Whitaker, female    DOB: Apr 11, 1981, 36 y.o.   MRN: [id number]  HPI: Tracy Whitaker is a 36 y.o. female  Chief Complaint  Patient presents with  . Urinary Tract Infection  . Vaginal Discharge   Vaginal discharge, abdominal cramping, malaise, and right flank pain x 1 day.  Thick, white discharge, some itching, no odor noticed. Denies fever, chills, N/V, abdominal pain. Has not tried anything OTC for sxs.   Past Medical History:  Diagnosis Date  . ADHD (attention deficit hyperactivity disorder)   . History of stomach ulcers   . Myalgia   . Polycystic disease, ovaries   . Pulmonary nodules 08/25/2017   right lung, repeat imaging in 1 year  . Ulcerative colitis ([place])    Social History   Socioeconomic History  . Marital status: Single    Spouse name: Not on file  . Number of children: Not on file  . Years of education: Not on file  . Highest education level: Not on file  Social Needs  . Financial resource strain: Not on file  . Food insecurity - worry: Not on file  . Food insecurity - inability: Not on file  . Transportation needs - medical: Not on file  . Transportation needs - non-medical: Not on file  Occupational History  . Not on file  Tobacco Use  . Smoking status: Current Every Day Smoker    Packs/day: 1.00    Types: Cigarettes  . Smokeless tobacco: Never Used  Substance and Sexual Activity  . Alcohol use: Yes    Alcohol/week: 2.4 oz    Types: 4 Standard drinks or equivalent per week  . Drug use: No  . Sexual activity: Yes    Birth control/protection: Pill  Other Topics Concern  . Not on file  Social History Narrative  . Not on file    Relevant past medical, surgical, family and social history reviewed and updated as indicated. Interim medical history since our last visit reviewed. Allergies and medications reviewed and  updated.  Review of Systems  Constitutional: Positive for fatigue.  Respiratory: Negative.   Cardiovascular: Negative.   Gastrointestinal: Negative.   Genitourinary: Positive for flank pain and vaginal discharge.       Vaginal itching  Neurological: Negative.   Psychiatric/Behavioral: Negative.     Per HPI unless specifically indicated above     Objective:    BP 103/73   Pulse 81   Wt 190 lb (86.2 kg)   SpO2 98%   BMI 32.11 kg/m   Wt Readings from Last 3 Encounters:  11/14/17 190 lb (86.2 kg)  10/21/17 189 lb 14.4 oz (86.1 kg)  09/15/17 189 lb 12.8 oz (86.1 kg)    Physical Exam  Constitutional: She is oriented to person, place, and time. She appears well-developed and well-nourished.  HENT:  Head: Atraumatic.  Eyes: Conjunctivae are normal. No scleral icterus.  Neck: Neck supple.  Cardiovascular: Normal rate and normal heart sounds.  Pulmonary/Chest: Effort normal and breath sounds normal. No respiratory distress. She has no wheezes.  Abdominal: Soft. Bowel sounds are normal. She exhibits no distension. There is no tenderness.  Genitourinary: Vaginal discharge found.  Musculoskeletal: Normal range of motion. She exhibits no tenderness (No CVA tenderness b/l).  Neurological: She is alert and oriented to person, place,  and time.  Skin: Skin is warm and dry.  Psychiatric: She has a normal mood and affect. Her behavior is normal.  Nursing note and vitals reviewed.  Results for orders placed or performed in visit on 11/14/17  WET PREP FOR [place], YEAST, CLUE  Result Value Ref Range   Trichomonas Exam Negative Negative   Yeast Exam Negative Negative   Clue Cell Exam Positive (A) Negative  Microscopic Examination  Result Value Ref Range   WBC, UA 0-5 0 - 5 /hpf   RBC, UA 0-2 0 - 2 /hpf   Epithelial Cells (non renal) 0-10 0 - 10 /hpf   Bacteria, UA Few None seen/Few  Urine Culture, Reflex  Result Value Ref Range   Urine Culture, Routine Final report    Organism ID,  Bacteria Comment   UA/M w/rflx Culture, Routine  Result Value Ref Range   Specific Gravity, UA 1.015 1.005 - 1.030   pH, UA 6.5 5.0 - 7.5   Color, UA Orange Yellow   Appearance Ur Cloudy (A) Clear   Leukocytes, UA Negative Negative   Protein, UA 1+ (A) Negative/Trace   Glucose, UA Trace (A) Negative   Ketones, UA Negative Negative   RBC, UA Trace (A) Negative   Bilirubin, UA Negative Negative   Urobilinogen, Ur 2.0 (H) 0.2 - 1.0 mg/dL   Nitrite, UA Positive (A) Negative   Microscopic Examination See below:    Urinalysis Reflex Comment       Assessment & Plan:   Problem List Items Addressed This Visit    None    Visit Diagnoses    Acute lower UTI    -  Primary   U/A + for UTI, will treat with cipro, fluids, and await cx. F/u if no improvement in sxs.    Relevant Medications   clindamycin (CLEOCIN) 150 MG capsule   Other Relevant Orders   UA/M w/rflx Culture, Routine (Completed)   BV (bacterial vaginosis)       Wet prep + for clue cells. Will tx with clindamycin, discussed avoiding sexual contact until tx complete. Probiotics, vaginal hygiene reviewed   Relevant Medications   clindamycin (CLEOCIN) 150 MG capsule   Other Relevant Orders   WET PREP FOR [place], YEAST, CLUE (Completed)       Follow up plan: Return for as scheduled.

## 2017-11-16 LAB — MICROSCOPIC EXAMINATION

## 2017-11-16 LAB — UA/M W/RFLX CULTURE, ROUTINE
Bilirubin, UA: NEGATIVE
Ketones, UA: NEGATIVE
Leukocytes, UA: NEGATIVE
Nitrite, UA: POSITIVE — AB
Specific Gravity, UA: 1.015 (ref 1.005–1.030)
Urobilinogen, Ur: 2 mg/dL — ABNORMAL HIGH (ref 0.2–1.0)
pH, UA: 6.5 (ref 5.0–7.5)

## 2017-11-16 LAB — URINE CULTURE, REFLEX

## 2017-11-17 NOTE — Patient Instructions (Signed)
F/u as scheduled

## 2017-11-18 ENCOUNTER — Ambulatory Visit (INDEPENDENT_AMBULATORY_CARE_PROVIDER_SITE_OTHER): Payer: BLUE CROSS/BLUE SHIELD | Admitting: Family Medicine

## 2017-11-18 ENCOUNTER — Encounter: Payer: Self-pay | Admitting: Family Medicine

## 2017-11-18 VITALS — BP 99/66 | HR 75 | Temp 98.0°F | Wt 192.8 lb

## 2017-11-18 DIAGNOSIS — Z79899 Other long term (current) drug therapy: Secondary | ICD-10-CM

## 2017-11-18 DIAGNOSIS — Z716 Tobacco abuse counseling: Secondary | ICD-10-CM | POA: Diagnosis not present

## 2017-11-18 DIAGNOSIS — F909 Attention-deficit hyperactivity disorder, unspecified type: Secondary | ICD-10-CM | POA: Diagnosis not present

## 2017-11-18 MED ORDER — VARENICLINE TARTRATE 1 MG PO TABS: 1.0000 mg | ORAL_TABLET | Freq: Two times a day (BID) | ORAL | 1 refills | Status: DC

## 2017-11-18 MED ORDER — ACYCLOVIR 5 % EX OINT: 1.0000 "application " | TOPICAL_OINTMENT | CUTANEOUS | 3 refills | Status: AC

## 2017-11-18 NOTE — Progress Notes (Signed)
BP 99/66 (BP Location: Left Arm, Patient Position: Sitting, Cuff Size: Normal)   Pulse 75   Temp 98 F (36.7 C) (Oral)   Wt 192 lb 12.8 oz (87.5 kg)   SpO2 99%   BMI 32.58 kg/m    Subjective:    Patient ID: Tracy Whitaker, female    DOB: Mar 31, 1981, 36 y.o.   MRN: [id number]  HPI: Tracy Whitaker is a 36 y.o. female  Chief Complaint  Patient presents with  . Follow-up    Patient states the Chantix is working well. Has cut back smoking tremendously. Patient has no other complaints.  . Nicotine Dependence   Patient presents today for 1 month f/u after starting chantix for smoking cessation. Down to 4 cigarettes per day from about 1 ppd. Feeling great about her progress, states the medication works very well for her. Some vivid dreams but no nightmares or SI. Wanting to continue.   Also wanting to start receiving her ADHD treatment here so she can consolidate all of her care to one office. Has been taking 20 mg adderall with good relief for years, no concerns. Finds she is more able to focus and be productive both at work and at home with the medication. No side effects noted.   Past Medical History:  Diagnosis Date  . ADHD (attention deficit hyperactivity disorder)   . History of stomach ulcers   . Myalgia   . Polycystic disease, ovaries   . Pulmonary nodules 08/25/2017   right lung, repeat imaging in 1 year  . Ulcerative colitis ([place])    Social History   Socioeconomic History  . Marital status: Single    Spouse name: Not on file  . Number of children: Not on file  . Years of education: Not on file  . Highest education level: Not on file  Social Needs  . Financial resource strain: Not on file  . Food insecurity - worry: Not on file  . Food insecurity - inability: Not on file  . Transportation needs - medical: Not on file  . Transportation needs - non-medical: Not on file  Occupational History  . Not on file  Tobacco Use  . Smoking status: Current Every Day  Smoker    Packs/day: 1.00    Types: Cigarettes  . Smokeless tobacco: Never Used  Substance and Sexual Activity  . Alcohol use: Yes    Alcohol/week: 2.4 oz    Types: 4 Standard drinks or equivalent per week  . Drug use: No  . Sexual activity: Yes    Birth control/protection: Pill  Other Topics Concern  . Not on file  Social History Narrative  . Not on file   Relevant past medical, surgical, family and social history reviewed and updated as indicated. Interim medical history since our last visit reviewed. Allergies and medications reviewed and updated.  Review of Systems  Constitutional: Negative.   HENT: Negative.   Respiratory: Negative.   Cardiovascular: Negative.   Gastrointestinal: Negative.   Genitourinary: Negative.   Musculoskeletal: Negative.   Neurological: Negative.   Psychiatric/Behavioral: Positive for sleep disturbance (vivid dreams).    Per HPI unless specifically indicated above     Objective:    BP 99/66 (BP Location: Left Arm, Patient Position: Sitting, Cuff Size: Normal)   Pulse 75   Temp 98 F (36.7 C) (Oral)   Wt 192 lb 12.8 oz (87.5 kg)   SpO2 99%   BMI 32.58 kg/m   Wt Readings from Last 3 Encounters:  11/18/17 192 lb 12.8 oz (87.5 kg)  11/14/17 190 lb (86.2 kg)  10/21/17 189 lb 14.4 oz (86.1 kg)    Physical Exam  Constitutional: She is oriented to person, place, and time. She appears well-developed and well-nourished. No distress.  HENT:  Head: Atraumatic.  Eyes: Conjunctivae are normal. Pupils are equal, round, and reactive to light.  Neck: Normal range of motion. Neck supple.  Cardiovascular: Normal rate and normal heart sounds.  Pulmonary/Chest: Effort normal and breath sounds normal. No respiratory distress.  Musculoskeletal: Normal range of motion.  Neurological: She is alert and oriented to person, place, and time.  Skin: Skin is warm and dry.  Psychiatric: She has a normal mood and affect. Her behavior is normal.  Nursing note  and vitals reviewed.     Assessment & Plan:   Problem List Items Addressed This Visit      Other   ADHD (attention deficit hyperactivity disorder) - Primary    Controlled substance agreement signed, discussed office protocols for management. Questions answered. Urine drug screen performed. Pt does admit to very infrequent marijuana use. Script given for this month's adderall prescription       Other Visit Diagnoses    Controlled substance agreement signed       Relevant Orders   Urine drugs of abuse scrn w alc, routine (Ref Lab)   Encounter for smoking cessation counseling       Great success with chantix, continue current regimen. F/u with any issues or need for additional support       Follow up plan: Return in about 3 months (around 02/16/2018) for ADHD, smoking cessation.

## 2017-11-21 NOTE — Patient Instructions (Signed)
Follow up in 3 months

## 2017-11-21 NOTE — Assessment & Plan Note (Signed)
Controlled substance agreement signed, discussed office protocols for management. Questions answered. Urine drug screen performed. Pt does admit to very infrequent marijuana use. Script given for this month's adderall prescription

## 2017-11-29 IMAGING — CT CT RENAL STONE PROTOCOL
2 of 4 series · 15 of 46 positions shown, 17 images · non-contrast
Comparison: November 06, 2016

CLINICAL DATA: Recent ureteral calculus with stent placement on the
left. Persistent left flank pain

EXAM:
CT ABDOMEN AND PELVIS WITHOUT CONTRAST
TECHNIQUE: Multidetector CT imaging of the abdomen and pelvis was performed
following the standard protocol without oral or intravenous contrast
material administration.

[Series 2: stone full standard · axial · 0.77mm/px · z∈[-1085,-695]mm · 12 of 90 slices shown, 14 images]
[im 8/90  soft-tissue]
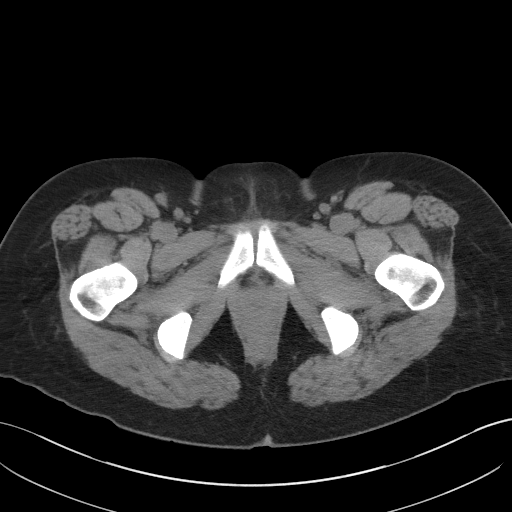
[im 8/90  bone]
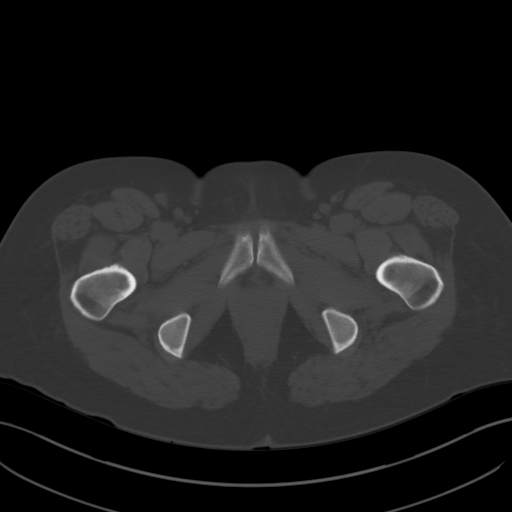
[im 15/90  soft-tissue]
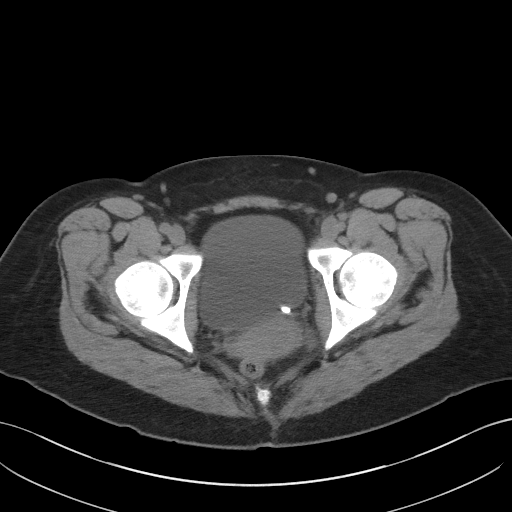
[im 22/90  soft-tissue]
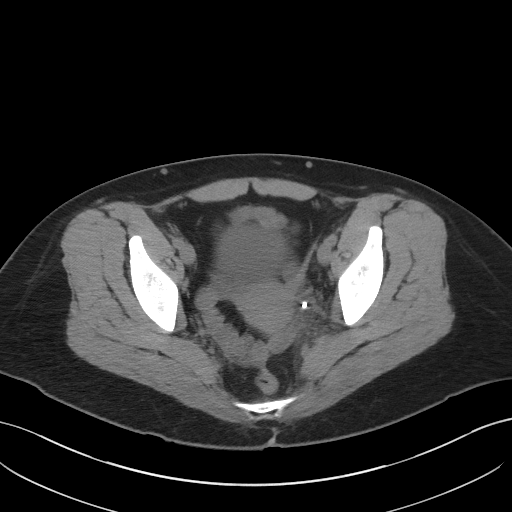
[im 29/90  soft-tissue]
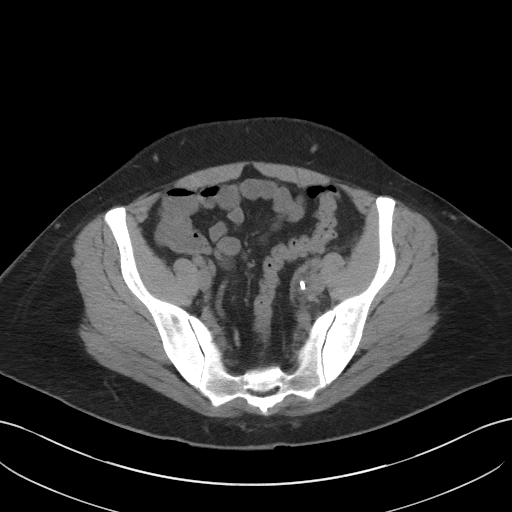
[im 36/90  soft-tissue]
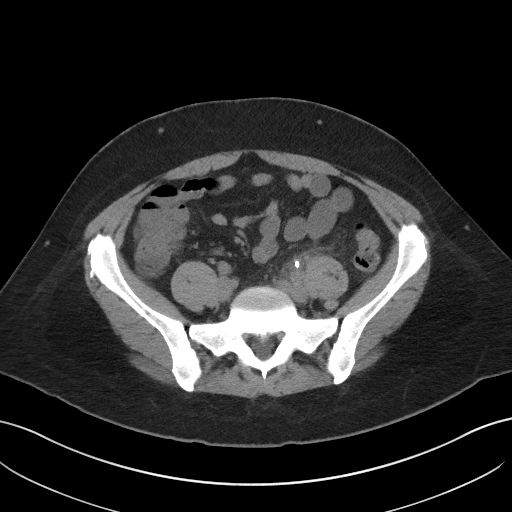
[im 43/90  soft-tissue]
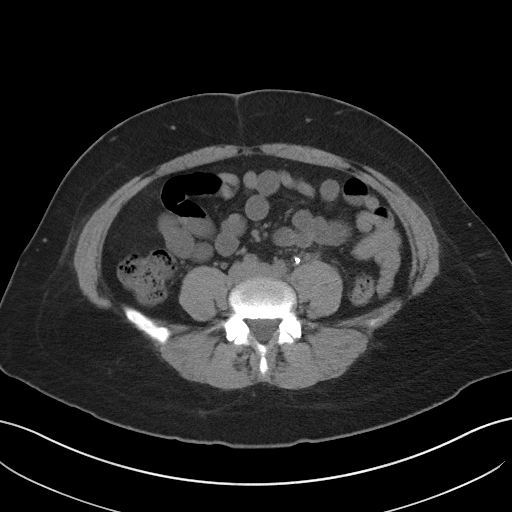
[im 50/90  soft-tissue]
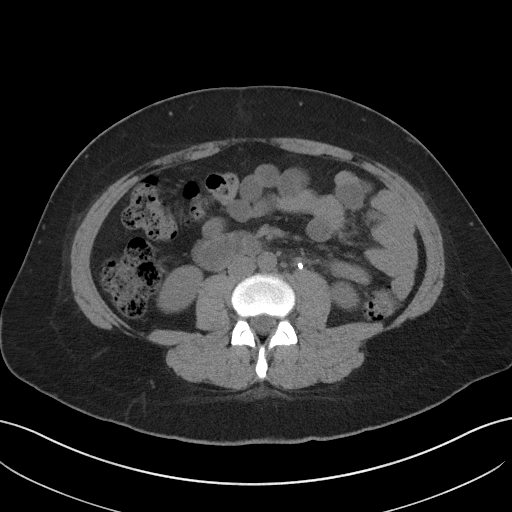
[im 57/90  soft-tissue]
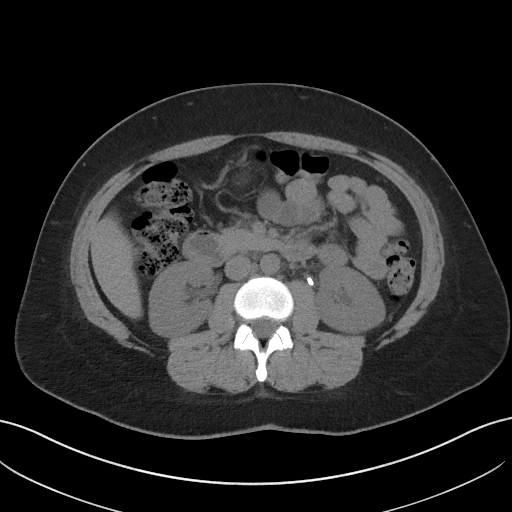
[im 65/90  soft-tissue]
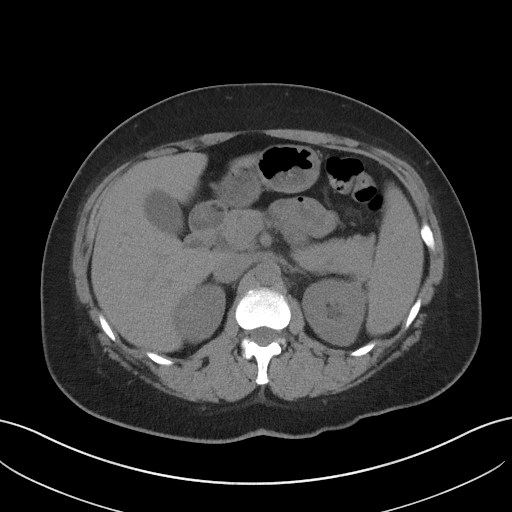
[im 65/90  bone]
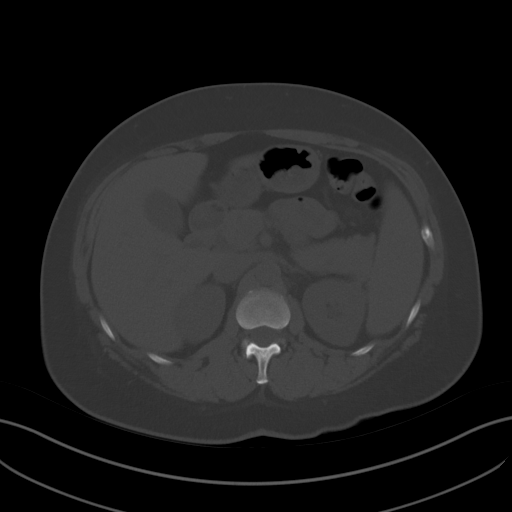
[im 72/90  soft-tissue]
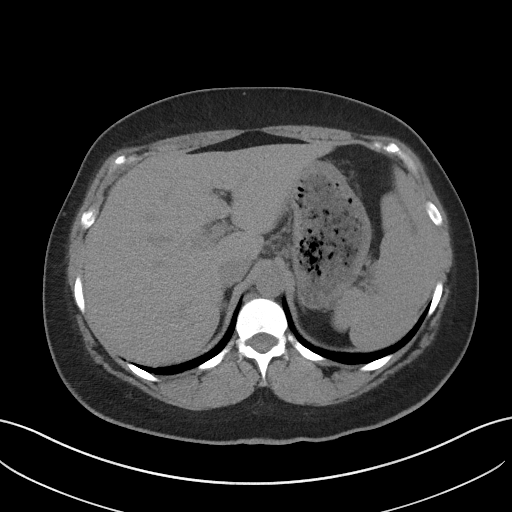
[im 79/90  soft-tissue]
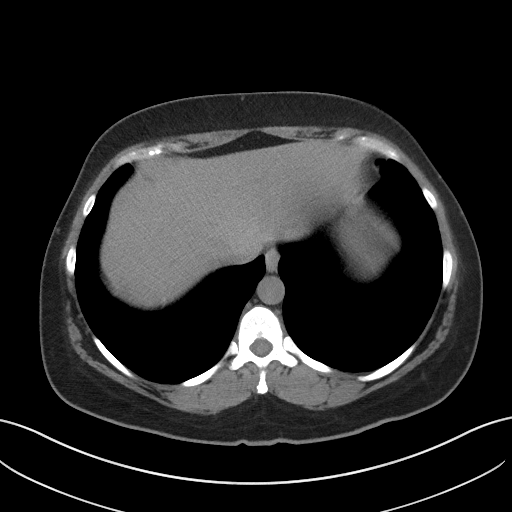
[im 86/90  soft-tissue]
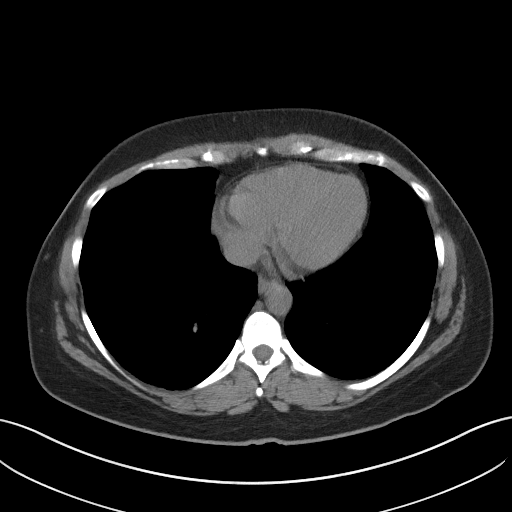

[Series 5: coronal · coronal · 0.75mm/px · 3 of 136 slices shown]
[im 46/136  soft-tissue]
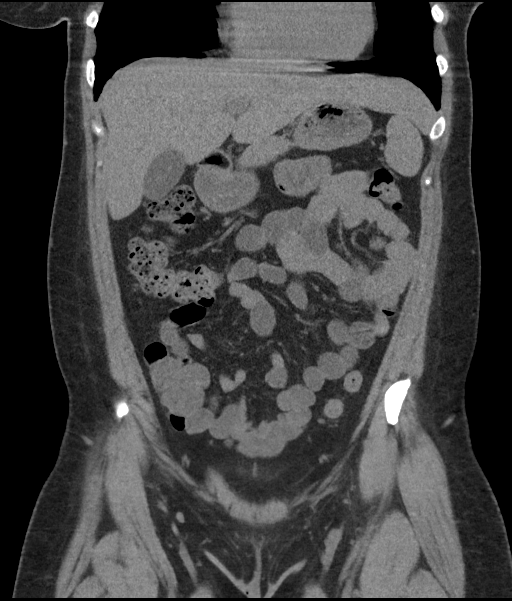
[im 61/136  soft-tissue]
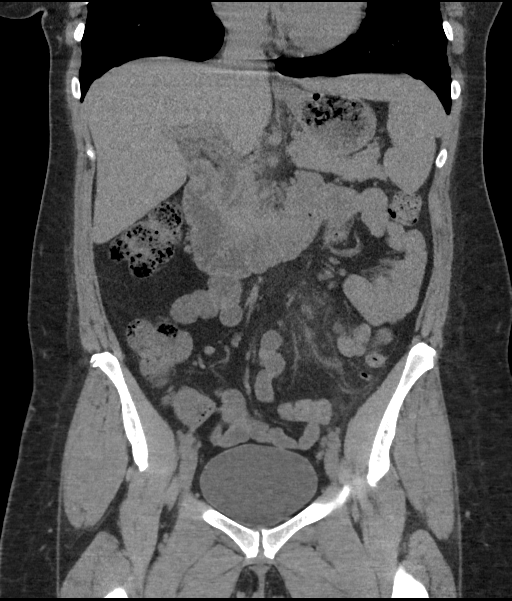
[im 76/136  soft-tissue]
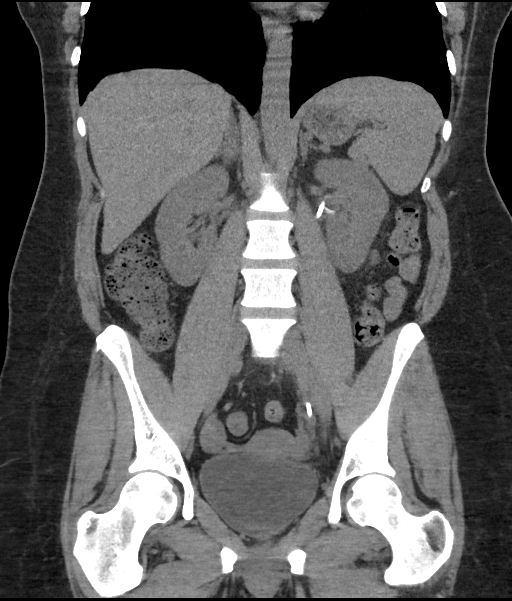

[15 of 46 positions shown; findings below may reference images not displayed]

FINDINGS: Lower chest: Lung bases are clear.

Hepatobiliary: No focal liver lesions are evident on this
noncontrast enhanced study. Gallbladder wall is not appreciably
thickened. There is no biliary duct dilatation.

Pancreas: There is no pancreatic mass or inflammatory focus.

Spleen: Spleen measures 14.4 x 9.6 x 6.5 cm with a measured splenic
volume of 449 cubic cm. No focal splenic lesions are evident.

Adrenals/Urinary Tract: Adrenals appear normal bilaterally. Left
kidney is subtly edematous. There is no renal mass on either side.
There is no hydronephrosis on the right. There is mild
hydronephrosis on the right. A double-J stent extends from the left
renal pelvis to the urinary bladder. There is a 1 mm calculus in the
upper pole of the left kidney. There is no demonstrable calculus on
the right. No ureteral calculus is evident on either side. Note that
the double-J stent could obscure a small ureteral calculus. Urinary
bladder is midline with wall thickness within normal limits.

Stomach/Bowel: There are occasional sigmoid diverticula without
diverticulitis. There is no bowel wall or mesenteric thickening. No
evident bowel obstruction. No free air or portal venous air.

Vascular/Lymphatic: There is no abdominal aortic aneurysm. No
vascular lesions are evident on this noncontrast enhanced study.
There is no appreciable adenopathy in abdomen or pelvis.

Reproductive: Uterus is anteverted. There is no pelvic mass or
pelvic fluid collection.

Other: Appendix appears normal. There is no ascites or abscess in
the abdomen or pelvis.

There is soft tissue stranding along the course the left ureter
extending from the left renal pelvis to the bladder. No abnormal
fluid collection is seen in this area.

Musculoskeletal: There are no blastic or lytic bone lesions. There
is no intramuscular or abdominal wall lesion.
IMPRESSION: Persistent mild hydronephrosis on the left with double-J stent on
the left extending from the renal pelvis to the bladder. There is
soft tissue stranding along the course of the left ureter without
abnormal fluid or abscess. There is a 1 mm calculus in the upper
pole of the left kidney. Left kidney is subtly edematous. No
ureteral calculi are evident on either side. It must be cautioned
that a small left ureteral calculus could be obscured by the
double-J stent. Urinary bladder wall thickness is normal.

There are occasional sigmoid diverticula without diverticulitis. No
bowel obstruction or bowel wall thickening. No abscess. Appendix
appears normal.

Prominent spleen without focal splenic lesion evident.

## 2017-12-14 ENCOUNTER — Telehealth: Payer: Self-pay | Admitting: Obstetrics and Gynecology

## 2017-12-14 NOTE — Telephone Encounter (Signed)
The patient called and stated that she needs to come to the office to pick up refill of amphetamine-dextroamphetamine (ADDERALL) 20 MG tablet, The patient would like for Amy to call back and gave her "Okay" for amy to leave a vm. Please advise.

## 2017-12-16 NOTE — Telephone Encounter (Signed)
Left pt message about medication

## 2017-12-16 NOTE — Telephone Encounter (Signed)
Patient called and states that she is going to be out of her medication next week and needs to know what to do about refilling this RX. Patient is requesting a call back. Her contact # is [phone]. Please advise. Thank you

## 2017-12-17 ENCOUNTER — Other Ambulatory Visit: Payer: Self-pay | Admitting: Family Medicine

## 2017-12-19 MED ORDER — VARENICLINE TARTRATE 1 MG PO TABS: 1.0000 mg | ORAL_TABLET | Freq: Two times a day (BID) | ORAL | 1 refills | Status: DC

## 2018-01-24 ENCOUNTER — Telehealth: Payer: Self-pay | Admitting: Family Medicine

## 2018-01-24 NOTE — Telephone Encounter (Signed)
Copied from [place] [phone] [phone]. Topic: Quick Communication - Rx Refill/Question >> Jan 24, 2018  4:17 PM Bea Graff, NT wrote: Medication: escitalopram [place])   Has the patient contacted their pharmacy? Yes.     (Agent: If no, request that the patient contact the pharmacy for the refill.)   Preferred Pharmacy (with phone number or street name): [facility] on [address]: Please be advised that RX refills may take up to 3 business days. We ask that you follow-up with your pharmacy.

## 2018-01-25 NOTE — Telephone Encounter (Signed)
Requesting refill of Lexapro

## 2018-01-25 NOTE — Telephone Encounter (Signed)
Not on med profile. Left VM to call back

## 2018-01-25 NOTE — Telephone Encounter (Signed)
Please route accordingly, this patient does not come to [facility]

## 2018-01-27 ENCOUNTER — Telehealth: Payer: Self-pay

## 2018-01-27 ENCOUNTER — Encounter: Payer: Self-pay | Admitting: Family Medicine

## 2018-01-27 ENCOUNTER — Ambulatory Visit: Payer: BLUE CROSS/BLUE SHIELD | Admitting: Family Medicine

## 2018-01-27 VITALS — BP 100/66 | HR 87 | Temp 99.2°F | Wt 188.2 lb

## 2018-01-27 DIAGNOSIS — Z9114 Patient's other noncompliance with medication regimen: Secondary | ICD-10-CM

## 2018-01-27 DIAGNOSIS — Z202 Contact with and (suspected) exposure to infections with a predominantly sexual mode of transmission: Secondary | ICD-10-CM

## 2018-01-27 DIAGNOSIS — B009 Herpesviral infection, unspecified: Secondary | ICD-10-CM

## 2018-01-27 DIAGNOSIS — F909 Attention-deficit hyperactivity disorder, unspecified type: Secondary | ICD-10-CM

## 2018-01-27 DIAGNOSIS — F32A Depression, unspecified: Secondary | ICD-10-CM

## 2018-01-27 DIAGNOSIS — F329 Major depressive disorder, single episode, unspecified: Secondary | ICD-10-CM

## 2018-01-27 DIAGNOSIS — F419 Anxiety disorder, unspecified: Secondary | ICD-10-CM

## 2018-01-27 HISTORY — DX: Herpesviral infection, unspecified: B00.9

## 2018-01-27 MED ORDER — ESCITALOPRAM OXALATE 20 MG PO TABS: 20.0000 mg | ORAL_TABLET | Freq: Every day | ORAL | 1 refills | Status: DC

## 2018-01-27 MED ORDER — CHLORHEXIDINE GLUCONATE 4 % EX LIQD: Freq: Every day | CUTANEOUS | 3 refills | Status: DC | PRN

## 2018-01-27 NOTE — Telephone Encounter (Signed)
Pt aware that we are no longer responsible for her ADHD medications. Contract has been terminated and she will have to get any further refills on controlled medications from her GYN.

## 2018-01-27 NOTE — Telephone Encounter (Signed)
Patient came in for appointment for refill on ADHD medication.  When going through medications while rooming patient to see provider patient explained Melody Shambly from [facility] put her on Lexapro. Patient explained that we did not give her a 3 month supply on her ADHD so [facility] filled her ADHD medication too. I asked patient if she signed a contract with [place] (which was signed 11/15/2018) and she said yes. I explained that the medication is on a 28 day fill, and she would have to follow up in 3 months with [place]. She could have called for a refill on her medication. Patient said she did not know that so she call [place]. Patient never called [place] requesting a refill from 11/18/2017-01/20/2018. Patient got her medication filled to [facility] on [facility] by [place] 12/24/2017 and 01/19/2018. The last time Apolonio Schneiders filled the medication was 11/18/2017. Again, patient never sent called or sent message regarding another refill. Patient was also put on seroquel which she did not notify us of. Spoke with Amy about the Rx Contract had been broken. Amy was very understanding and did not know patient was under a contract by another provider and will pass a note along to [facility] ASAP regarding this.   Routing to provider if she needs to document anything else. (See Rx agreement in chart)

## 2018-01-27 NOTE — Progress Notes (Signed)
BP 100/66 (BP Location: Left Arm, Patient Position: Sitting, Cuff Size: Normal)   Pulse 87   Temp 99.2 F (37.3 C) (Oral)   Wt 188 lb 3.2 oz (85.4 kg)   SpO2 99%   BMI 31.81 kg/m    Subjective:    Patient ID: Tracy Whitaker, female    DOB: [birth year], 37 y.o.   MRN: [id number]  HPI: Tracy Whitaker is a 37 y.o. female  Chief Complaint  Patient presents with  . ADHD  . Medication Refill   Pt here today mostly to get refills on her lexapro and to have STI screening performed as she recently found out her significant other has been unfaithful. No dysuria, lesions, discharge, etc but just wanting to make sure. Doing well on her lexapro, no concerns.   Of note, pt signed a controlled substance contract with [place] at prior visit so we could take over her ADHD medication previously being given by her GYN, but has since requested and received refills from GYN. States she wants to switch back to this office for her medications again.   Past Medical History:  Diagnosis Date  . ADHD (attention deficit hyperactivity disorder)   . History of stomach ulcers   . Myalgia   . Polycystic disease, ovaries   . Pulmonary nodules 08/25/2017   right lung, repeat imaging in 1 year  . Ulcerative colitis ([place])    Social History   Socioeconomic History  . Marital status: Single    Spouse name: Not on file  . Number of children: Not on file  . Years of education: Not on file  . Highest education level: Not on file  Social Needs  . Financial resource strain: Not on file  . Food insecurity - worry: Not on file  . Food insecurity - inability: Not on file  . Transportation needs - medical: Not on file  . Transportation needs - non-medical: Not on file  Occupational History  . Not on file  Tobacco Use  . Smoking status: Current Every Day Smoker    Packs/day: 1.00    Types: Cigarettes  . Smokeless tobacco: Never Used  Substance and Sexual Activity  . Alcohol use: Yes    Alcohol/week: 2.4  oz    Types: 4 Standard drinks or equivalent per week  . Drug use: No  . Sexual activity: Yes    Birth control/protection: Pill  Other Topics Concern  . Not on file  Social History Narrative  . Not on file   Relevant past medical, surgical, family and social history reviewed and updated as indicated. Interim medical history since our last visit reviewed. Allergies and medications reviewed and updated.  Review of Systems  Per HPI unless specifically indicated above     Objective:    BP 100/66 (BP Location: Left Arm, Patient Position: Sitting, Cuff Size: Normal)   Pulse 87   Temp 99.2 F (37.3 C) (Oral)   Wt 188 lb 3.2 oz (85.4 kg)   SpO2 99%   BMI 31.81 kg/m   Wt Readings from Last 3 Encounters:  01/27/18 188 lb 3.2 oz (85.4 kg)  11/18/17 192 lb 12.8 oz (87.5 kg)  11/14/17 190 lb (86.2 kg)    Physical Exam  Constitutional: She is oriented to person, place, and time. She appears well-developed and well-nourished.  HENT:  Head: Atraumatic.  Eyes: Conjunctivae are normal. Pupils are equal, round, and reactive to light.  Neck: Normal range of motion. Neck supple.  Cardiovascular: Normal  rate and normal heart sounds.  Pulmonary/Chest: Effort normal and breath sounds normal.  Musculoskeletal: Normal range of motion.  Neurological: She is alert and oriented to person, place, and time.  Skin: Skin is warm and dry.  Psychiatric: She has a normal mood and affect. Her behavior is normal.  Nursing note and vitals reviewed.  Results for orders placed or performed in visit on 01/27/18  GC/Chlamydia Probe Amp  Result Value Ref Range   Chlamydia trachomatis, NAA Negative Negative   Neisseria gonorrhoeae by PCR Negative Negative  HIV antibody  Result Value Ref Range   HIV Screen 4th Generation wRfx Non Reactive Non Reactive  HSV(herpes simplex vrs) 1+2 ab-IgG  Result Value Ref Range   HSV 1 Glycoprotein G Ab, IgG 23.20 (H) 0.00 - 0.90 index   HSV 2 IgG, Type Spec 4.02 (H) 0.00  - 0.90 index  RPR  Result Value Ref Range   RPR Ser Ql Non Reactive Non Reactive  Hepatitis C antibody  Result Value Ref Range   Hep C Virus Ab <0.1 0.0 - 0.9 s/co ratio  HSV-2 IgG Supplemental Test  Result Value Ref Range   HSV-2 IgG Supplemental Test Positive (A) Negative      Assessment & Plan:   Problem List Items Addressed This Visit      Other   ADHD (attention deficit hyperactivity disorder)    Managed again by GYN, no longer getting controlled medications from this office.      Anxiety and depression    Lexapro refilled, stable. Continue current regimen      Relevant Medications   escitalopram (LEXAPRO) 20 MG tablet   Controlled substance agreement broken    Pt signed agreement with [place] for her adderall and has since resumed getting refills from GYN, who was unaware there was a new contract. Agreement terminated, pt no longer able to get any controlled substances from this office.        Other Visit Diagnoses    Possible exposure to STD    -  Primary   Will run screening STI labs. Await results.    Relevant Orders   HIV antibody (Completed)   HSV(herpes simplex vrs) 1+2 ab-IgG (Completed)   RPR (Completed)   GC/Chlamydia Probe Amp (Completed)   Hepatitis C antibody (Completed)       Follow up plan: Return for CPE.

## 2018-01-28 LAB — HIV ANTIBODY (ROUTINE TESTING W REFLEX): HIV Screen 4th Generation wRfx: NONREACTIVE

## 2018-01-28 LAB — HSV-2 IGG SUPPLEMENTAL TEST: HSV-2 IgG Supplemental Test: POSITIVE — AB

## 2018-01-28 LAB — HEPATITIS C ANTIBODY: Hep C Virus Ab: <0.1 s/co ratio (ref 0.0–0.9)

## 2018-01-28 LAB — HSV(HERPES SIMPLEX VRS) I + II AB-IGG
HSV 1 Glycoprotein G Ab, IgG: 23.2 index — ABNORMAL HIGH (ref 0.00–0.90)
HSV 2 IgG, Type Spec: 4.02 index — ABNORMAL HIGH (ref 0.00–0.90)

## 2018-01-28 LAB — RPR: RPR Ser Ql: NONREACTIVE

## 2018-01-29 LAB — GC/CHLAMYDIA PROBE AMP
Chlamydia trachomatis, NAA: NEGATIVE
Neisseria gonorrhoeae by PCR: NEGATIVE

## 2018-01-30 DIAGNOSIS — Z9114 Patient's other noncompliance with medication regimen: Secondary | ICD-10-CM

## 2018-01-30 DIAGNOSIS — Z91148 Patient's other noncompliance with medication regimen for other reason: Secondary | ICD-10-CM | POA: Insufficient documentation

## 2018-01-30 HISTORY — DX: Patient's other noncompliance with medication regimen for other reason: Z91.148

## 2018-01-30 HISTORY — DX: Patient's other noncompliance with medication regimen: Z91.14

## 2018-01-30 NOTE — Assessment & Plan Note (Signed)
Lexapro refilled, stable. Continue current regimen

## 2018-01-30 NOTE — Assessment & Plan Note (Signed)
Pt signed agreement with [place] for her adderall and has since resumed getting refills from GYN, who was unaware there was a new contract. Agreement terminated, pt no longer able to get any controlled substances from this office.

## 2018-01-30 NOTE — Assessment & Plan Note (Signed)
Managed again by GYN, no longer getting controlled medications from this office.

## 2018-01-30 NOTE — Patient Instructions (Signed)
Follow up as needed

## 2018-02-08 ENCOUNTER — Ambulatory Visit: Payer: Self-pay

## 2018-02-08 MED ORDER — CLINDAMYCIN HCL 150 MG PO CAPS: 150.0000 mg | ORAL_CAPSULE | Freq: Three times a day (TID) | ORAL | 0 refills | Status: DC

## 2018-02-08 NOTE — Telephone Encounter (Signed)
Left VM to return call to discuss sxs and let her know I sent over abx for her.

## 2018-02-08 NOTE — Telephone Encounter (Signed)
Pt. Called and left message that her "boil" is no better and provider said she would call in an antibiotic for her, if needed.

## 2018-02-09 NOTE — Telephone Encounter (Signed)
Tried calling patient no answer.  VM responded "patient not available at this time."

## 2018-02-17 ENCOUNTER — Ambulatory Visit: Payer: Self-pay | Admitting: Family Medicine

## 2018-02-18 ENCOUNTER — Other Ambulatory Visit: Payer: Self-pay | Admitting: Obstetrics and Gynecology

## 2018-02-21 ENCOUNTER — Telehealth: Payer: Self-pay | Admitting: Family Medicine

## 2018-02-21 ENCOUNTER — Telehealth: Payer: Self-pay | Admitting: Obstetrics and Gynecology

## 2018-02-21 NOTE — Telephone Encounter (Signed)
Needs refill on Atalol. Please let pt know when she can pick up.

## 2018-02-21 NOTE — Telephone Encounter (Signed)
Copied from [place] [id number]. Topic: Quick Communication - Rx Refill/Question >> Feb 21, 2018  4:02 PM Cleaster Corin, [place] wrote: Medication:DELZICOL 400 MG CPDR DR capsule [[id number] traZODone (DESYREL) 50 MG tablet [[id number]] metFORMIN (GLUCOPHAGE XR) 500 MG 24 hr tablet [[id number]]  Has the patient contacted their pharmacy? yes (Agent: If no, request that the patient contact the pharmacy for the refill.) Preferred Pharmacy (with phone number or street name): [facility] [place], [place] - [address] [place] [address] Phone: [phone] Fax: [phone]   Agent: Please be advised that RX refills may take up to 3 business days. We ask that you follow-up with your pharmacy.

## 2018-02-22 ENCOUNTER — Ambulatory Visit: Payer: BLUE CROSS/BLUE SHIELD | Admitting: Family Medicine

## 2018-02-22 ENCOUNTER — Encounter: Payer: Self-pay | Admitting: Family Medicine

## 2018-02-22 VITALS — BP 126/86 | HR 67 | Temp 98.3°F | Wt 179.9 lb

## 2018-02-22 DIAGNOSIS — K644 Residual hemorrhoidal skin tags: Secondary | ICD-10-CM

## 2018-02-22 DIAGNOSIS — N390 Urinary tract infection, site not specified: Secondary | ICD-10-CM | POA: Diagnosis not present

## 2018-02-22 DIAGNOSIS — L0232 Furuncle of buttock: Secondary | ICD-10-CM

## 2018-02-22 MED ORDER — HYDROCORTISONE 2.5 % RE CREA: 1.0000 "application " | TOPICAL_CREAM | Freq: Two times a day (BID) | RECTAL | 0 refills | Status: DC

## 2018-02-22 MED ORDER — DELZICOL 400 MG PO CPDR: 800.0000 mg | DELAYED_RELEASE_CAPSULE | Freq: Two times a day (BID) | ORAL | 1 refills | Status: DC

## 2018-02-22 MED ORDER — TRAZODONE HCL 50 MG PO TABS: 25.0000 mg | ORAL_TABLET | Freq: Every evening | ORAL | 1 refills | Status: DC | PRN

## 2018-02-22 MED ORDER — METFORMIN HCL ER 500 MG PO TB24: 500.0000 mg | ORAL_TABLET | Freq: Every day | ORAL | 1 refills | Status: DC

## 2018-02-22 MED ORDER — CIPROFLOXACIN HCL 500 MG PO TABS: 500.0000 mg | ORAL_TABLET | Freq: Two times a day (BID) | ORAL | 0 refills | Status: DC

## 2018-02-22 NOTE — Telephone Encounter (Signed)
RXs sent.

## 2018-02-22 NOTE — Progress Notes (Signed)
BP 126/86   Pulse 67   Temp 98.3 F (36.8 C) (Oral)   Wt 179 lb 14.4 oz (81.6 kg)   SpO2 98%   BMI 30.40 kg/m    Subjective:    Patient ID: Tracy Whitaker, female    DOB: 1981-01-16, 37 y.o.   MRN: [id number]  HPI: Tracy Whitaker is a 37 y.o. female  Chief Complaint  Patient presents with  . Urinary Tract Infection    Since Friday, pt on AZO, and Cipro for a boil  . Nausea    Pt states not a normal sympom w/ her UTIS   5 days of dysuria, frequency, hesitancy, left flank pain, nausea. Denies fever, chills, hematuria, diarrhea. Has been taking AZO for relief. Long hx of UTIs.   Has been on clindamycin for a boil which is improving but pt would like to get it checked out as the pain is better but she still feels like there's a mass back there. No rashes, itching, active drainage.   Past Medical History:  Diagnosis Date  . ADHD (attention deficit hyperactivity disorder)   . History of stomach ulcers   . Myalgia   . Polycystic disease, ovaries   . Pulmonary nodules 08/25/2017   right lung, repeat imaging in 1 year  . Ulcerative colitis ([place])    Social History   Socioeconomic History  . Marital status: Single    Spouse name: Not on file  . Number of children: Not on file  . Years of education: Not on file  . Highest education level: Not on file  Occupational History  . Not on file  Social Needs  . Financial resource strain: Not on file  . Food insecurity:    Worry: Not on file    Inability: Not on file  . Transportation needs:    Medical: Not on file    Non-medical: Not on file  Tobacco Use  . Smoking status: Current Every Day Smoker    Packs/day: 1.00    Types: Cigarettes  . Smokeless tobacco: Never Used  Substance and Sexual Activity  . Alcohol use: Yes    Alcohol/week: 2.4 oz    Types: 4 Standard drinks or equivalent per week  . Drug use: No  . Sexual activity: Yes    Birth control/protection: Pill  Lifestyle  . Physical activity:    Days per  week: Not on file    Minutes per session: Not on file  . Stress: Not on file  Relationships  . Social connections:    Talks on phone: Not on file    Gets together: Not on file    Attends religious service: Not on file    Active member of club or organization: Not on file    Attends meetings of clubs or organizations: Not on file    Relationship status: Not on file  . Intimate partner violence:    Fear of current or ex partner: Not on file    Emotionally abused: Not on file    Physically abused: Not on file    Forced sexual activity: Not on file  Other Topics Concern  . Not on file  Social History Narrative  . Not on file     Relevant past medical, surgical, family and social history reviewed and updated as indicated. Interim medical history since our last visit reviewed. Allergies and medications reviewed and updated.  Review of Systems  Per HPI unless specifically indicated above     Objective:  BP 126/86   Pulse 67   Temp 98.3 F (36.8 C) (Oral)   Wt 179 lb 14.4 oz (81.6 kg)   SpO2 98%   BMI 30.40 kg/m   Wt Readings from Last 3 Encounters:  02/22/18 179 lb 14.4 oz (81.6 kg)  01/27/18 188 lb 3.2 oz (85.4 kg)  11/18/17 192 lb 12.8 oz (87.5 kg)    Physical Exam  Constitutional: She is oriented to person, place, and time. She appears well-developed and well-nourished. No distress.  HENT:  Head: Atraumatic.  Eyes: Pupils are equal, round, and reactive to light. Conjunctivae are normal.  Neck: Normal range of motion. Neck supple.  Cardiovascular: Normal rate and normal heart sounds.  Pulmonary/Chest: Effort normal and breath sounds normal. No respiratory distress.  Abdominal: Soft. Bowel sounds are normal. There is no tenderness.  Genitourinary:     Genitourinary Comments: No palpable boil, redness, or warmth in buttock area at this time  Musculoskeletal: Normal range of motion. She exhibits no tenderness (No CVA tenderness b/l).  Neurological: She is alert  and oriented to person, place, and time.  Skin: Skin is warm and dry.  Psychiatric: She has a normal mood and affect. Her behavior is normal.  Nursing note and vitals reviewed.   Results for orders placed or performed in visit on 02/22/18  Microscopic Examination  Result Value Ref Range   WBC, UA 6-10 (A) 0 - 5 /hpf   RBC, UA 0-2 0 - 2 /hpf   Epithelial Cells (non renal) 0-10 0 - 10 /hpf   Bacteria, UA Few None seen/Few  Urine Culture, Reflex  Result Value Ref Range   Urine Culture, Routine Final report    Organism ID, Bacteria Comment   UA/M w/rflx Culture, Routine  Result Value Ref Range   Specific Gravity, UA 1.020 1.005 - 1.030   pH, UA 5.0 5.0 - 7.5   Color, UA Orange Yellow   Appearance Ur Hazy (A) Clear   Leukocytes, UA 3+ (A) Negative   Protein, UA 2+ (A) Negative/Trace   Glucose, UA 2+ (A) Negative   Ketones, UA 1+ (A) Negative   RBC, UA Trace (A) Negative   Urobilinogen, Ur 4.0 (H) 0.2 - 1.0 mg/dL   Nitrite, UA Positive (A) Negative   Microscopic Examination See below:    Urinalysis Reflex Comment       Assessment & Plan:   Problem List Items Addressed This Visit    None    Visit Diagnoses    Acute lower UTI    -  Primary   Will tx with cipro, probiotics, lots of water. Await cx results   Relevant Orders   UA/M w/rflx Culture, Routine (Completed)   Boil of buttock       Improved on clindamycin and neosporin regimen. Continue to monitor closely for recurrence.    External hemorrhoid       Found on exam, suspect causing at least some of her discomfort. Start anusol, sitz baths, tucks pads. F/u if worsening or no better       Follow up plan: Return for as scheduled.

## 2018-02-24 LAB — UA/M W/RFLX CULTURE, ROUTINE
Nitrite, UA: POSITIVE — AB
Specific Gravity, UA: 1.02 (ref 1.005–1.030)
Urobilinogen, Ur: 4 mg/dL — ABNORMAL HIGH (ref 0.2–1.0)
pH, UA: 5 (ref 5.0–7.5)

## 2018-02-24 LAB — MICROSCOPIC EXAMINATION

## 2018-02-24 LAB — URINE CULTURE, REFLEX

## 2018-02-24 NOTE — Patient Instructions (Signed)
Follow up as needed

## 2018-02-24 NOTE — Telephone Encounter (Signed)
Patient called stating she has not heard back from the nurse regarding her adderral script/ She would like a call back. Thanks

## 2018-02-28 ENCOUNTER — Telehealth: Payer: Self-pay | Admitting: Obstetrics and Gynecology

## 2018-02-28 NOTE — Telephone Encounter (Signed)
The patient called and that she has been trying to get in contact with someone for weeks and has not been able to, The patient stated that she need a refill of her  amphetamine-dextroamphetamine (ADDERALL) 20 MG tablet, The patient was very up set and short on the phone and stated that "she would like some to call her back in a timely manner to get her, her prescription" Please advise.

## 2018-03-02 NOTE — Telephone Encounter (Signed)
Pt called advised appt needed to be scheduled to discuss the adderall

## 2018-03-08 ENCOUNTER — Telehealth: Payer: Self-pay | Admitting: Family Medicine

## 2018-03-08 NOTE — Telephone Encounter (Signed)
Copied from [id number]. Topic: Quick Communication - Rx Refill/Question >> Mar 08, 2018  4:22 PM Oliver Pila B wrote: Medication: spironolactone (ALDACTONE) 100 MG tablet [[id number]]  Has the patient contacted their pharmacy? Yes.   (Agent: If no, request that the patient contact the pharmacy for the refill.) [facility] number [facility] Agent: Please be advised that RX refills may take up to 3 business days. We ask that you follow-up with your pharmacy.

## 2018-03-09 NOTE — Telephone Encounter (Signed)
Pt called back. Before I could finish pt states this is the 5th time she has told us she is wanting to switch to [facility]. I advised her [facility] can contact [facility] and transfer all her scripts.

## 2018-03-09 NOTE — Telephone Encounter (Signed)
(  [facility] at [facility]- if she is wanting to change pharmacies she should ask pharmacy for transfer) Left message to call back- need to alert patient to call pharmacy

## 2018-03-15 ENCOUNTER — Encounter: Payer: Self-pay | Admitting: Obstetrics and Gynecology

## 2018-03-15 ENCOUNTER — Ambulatory Visit: Payer: BLUE CROSS/BLUE SHIELD | Admitting: Obstetrics and Gynecology

## 2018-03-15 VITALS — BP 120/84 | HR 89 | Ht 64.5 in | Wt 178.6 lb

## 2018-03-15 DIAGNOSIS — F909 Attention-deficit hyperactivity disorder, unspecified type: Secondary | ICD-10-CM

## 2018-03-15 NOTE — Progress Notes (Signed)
Subjective:     Patient ID: Tracy Whitaker, female   DOB: 1981-08-22, 37 y.o.   MRN: [id number]  HPI Here to discuss medications. Established care with a PCP. Desires to continue getting adderal here. Doing well otherwise.   Review of Systems    negative Objective:   Physical Exam A&Ox4 Well groomed female in no distress Blood pressure 120/84, pulse 89, height 5' 4.5" (1.638 m), weight 178 lb 9.6 oz (81 kg), last menstrual period 03/12/2018.    Assessment:     ADD medication management    Plan:    Controlled substance contract signed. Med refilled for three months.  RTC in June for AE.  Krist Rosenboom,CNM

## 2018-05-12 ENCOUNTER — Ambulatory Visit: Payer: BLUE CROSS/BLUE SHIELD | Admitting: Physician Assistant

## 2018-05-12 ENCOUNTER — Encounter: Payer: Self-pay | Admitting: Physician Assistant

## 2018-05-12 VITALS — BP 109/77 | HR 83 | Wt 181.7 lb

## 2018-05-12 DIAGNOSIS — K610 Anal abscess: Secondary | ICD-10-CM

## 2018-05-12 DIAGNOSIS — F329 Major depressive disorder, single episode, unspecified: Secondary | ICD-10-CM | POA: Insufficient documentation

## 2018-05-12 DIAGNOSIS — F32A Depression, unspecified: Secondary | ICD-10-CM | POA: Insufficient documentation

## 2018-05-12 MED ORDER — LIDOCAINE-HYDROCORTISONE ACE 3-0.5 % RE CREA: 1.0000 | TOPICAL_CREAM | Freq: Two times a day (BID) | RECTAL | 0 refills | Status: DC

## 2018-05-12 NOTE — Patient Instructions (Signed)
Perianal Abscess  An abscess is an infected area that is filled with pus. A perianal abscess occurs in the perineum, which is the area between the anus and the scrotum in males and between the anus and the vagina in females. Perianal abscesses can vary in size. Without treatment, a perianal abscess can become larger and cause other problems.  What are the causes?  This condition is caused by:  · Waste from damaged or dead tissue (debris) that plugs up glands in the perineum. When this happens, an abscess may form.  · Infections of the perineum.    What are the signs or symptoms?  Common symptoms of this condition include:  · Swelling and redness in the area of the abscess. The redness may go beyond the abscess and appear as a red streak on the skin.  · Pain in the area of the abscess, including pain when sitting, walking, or passing stool.    Other possible symptoms include:  · A visible, painful lump, or a lump that can be felt when touched.  · Bleeding or pus-like discharge from the area.  · Fever.  · General weakness.    How is this diagnosed?  This condition is diagnosed based on your medical history and a physical exam of the affected area.  · This may involve examining the rectal area with a gloved hand (digital rectal exam).  · Sometimes, the health care provider needs to look into the rectum using a probe or a scope.  · For women, it may require a careful vaginal exam.    How is this treated?  Treatment for this condition may include:  · Making a cut (incision) in the abscess to drain the pus. This can sometimes be done in your health care provider's office or an emergency department after you are given medicine to numb the area (local anesthetic).  · Surgery to drain the abscess. This is for larger or deeper abscesses.  · Antibiotic medicines, if there is infection in the surrounding tissue (cellulitis).  · Having gauze packed into the abscess to continue draining the area.  · Frequent baths in warm water  that is deep enough to cover your hips and buttocks (sitz baths). These help the wound heal and they make the abscess less likely to come back.    Follow these instructions at home:  Medicines  · Take over-the-counter and prescription medicines for pain, fever, or discomfort only as told by your health care provider.  · If you were prescribed an antibiotic medicine, use it as told by your health care provider. Do not stop using the antibiotic even if you start to feel better.  · Do not drive or use heavy machinery while taking prescription pain medicine.  Wound care    · Keep the skin around the wound clean and dry. Avoid cleaning the area too much.  · Avoid scratching the wound.  · Avoid using colored or perfumed toilet papers.  · Take a sitz bath 3-4 times a day and after bowel movements. This will help reduce pain and swelling.  · If directed, apply ice to the injured area:  ? Put ice in a plastic bag.  ? Place a towel between your skin and the bag.  ? Leave the ice on for 20 minutes, 2-3 times a day.  · Check your incision area every day for signs of infection. Check for:  ? More redness, swelling, or pain.  ? More fluid or blood.  ?   Warmth.  ? Pus or a bad smell.  Gauze  · If gauze was used in the abscess, follow instructions from your health care provider about removing or changing the gauze. It can usually be removed in 2-3 days.  · Wash your hands with soap and water before you remove or change your gauze. If soap and water are not available, use hand sanitizer.  · If one or more drains were placed in the abscess cavity, be careful not to pull at them. Your health care provider will tell you how long they need to remain in place.  General instructions  · Keep all follow-up visits as told by your health care provider. This is important.  Contact a health care provider if:  · You have trouble passing stool or passing urine.  · Your pain or swelling in the affected area does not seem to be getting  better.  · The gauze packing or the drains come out before the planned time.  Get help right away if:  · You have problems moving or using your legs.  · You have severe or increasing pain.  · Your swelling in the affected area suddenly gets worse.  · You have a large increase in bleeding or passing of pus.  · You have chills or a fever.  This information is not intended to replace advice given to you by your health care provider. Make sure you discuss any questions you have with your health care provider.  Document Released: 12/15/2006 Document Revised: 05/28/2016 Document Reviewed: 04/19/2016  [employer]vier Interactive Patient Education © 2018 [employer]

## 2018-05-12 NOTE — Progress Notes (Signed)
Subjective:    Patient ID: Tracy Whitaker, female    DOB: 09/19/1981, 37 y.o.   MRN: [id number]  Tracy Whitaker is a 37 y.o. female presenting on 05/12/2018 for Hemorrhoids (x 2 months. bleeding x 2 weeks)   HPI  Hx of ulcerative colitis, under control per patient report, has been taking Delzicol for the past four years. Has been treated for hemorrhoid the past two months with annusol cream. Reports BRB bleeding and pain in her rectal area that is not consistent with UC flares. Denies fevers, chills, nausea, vomiting. Denies any purulent drainage. She has had history of pilonidal cyst that she has had two surgeries for.  Social History   Tobacco Use  . Smoking status: Current Every Day Smoker    Packs/day: 1.00    Types: Cigarettes  . Smokeless tobacco: Never Used  Substance Use Topics  . Alcohol use: Yes    Alcohol/week: 2.4 oz    Types: 4 Standard drinks or equivalent per week  . Drug use: No    Review of Systems Per HPI unless specifically indicated above     Objective:    BP 109/77   Pulse 83   Wt 181 lb 11.2 oz (82.4 kg)   SpO2 100%   BMI 30.71 kg/m   Wt Readings from Last 3 Encounters:  05/12/18 181 lb 11.2 oz (82.4 kg)  03/15/18 178 lb 9.6 oz (81 kg)  02/22/18 179 lb 14.4 oz (81.6 kg)    Physical Exam  Constitutional: She is oriented to person, place, and time.  Cardiovascular: Normal rate.  Pulmonary/Chest: Effort normal.  Genitourinary:     Neurological: She is alert and oriented to person, place, and time.  Skin: Skin is warm and dry.  Psychiatric: She has a normal mood and affect. Her behavior is normal.   Results for orders placed or performed in visit on 02/22/18  Microscopic Examination  Result Value Ref Range   WBC, UA 6-10 (A) 0 - 5 /hpf   RBC, UA 0-2 0 - 2 /hpf   Epithelial Cells (non renal) 0-10 0 - 10 /hpf   Bacteria, UA Few None seen/Few  Urine Culture, Reflex  Result Value Ref Range   Urine Culture, Routine Final report    Organism ID, Bacteria Comment   UA/M w/rflx Culture, Routine  Result Value Ref Range   Specific Gravity, UA 1.020 1.005 - 1.030   pH, UA 5.0 5.0 - 7.5   Color, UA Orange Yellow   Appearance Ur Hazy (A) Clear   Leukocytes, UA 3+ (A) Negative   Protein, UA 2+ (A) Negative/Trace   Glucose, UA 2+ (A) Negative   Ketones, UA 1+ (A) Negative   RBC, UA Trace (A) Negative   Urobilinogen, Ur 4.0 (H) 0.2 - 1.0 mg/dL   Nitrite, UA Positive (A) Negative   Microscopic Examination See below:    Urinalysis Reflex Comment       Assessment & Plan:   1. Perianal abscess  Not draining today, has history of UC and pilonidal cyst. Will send to surgery for further evaluation and any definitive management. Does not appear infected today, think abx will be of minimal benefit but if she notices purulent drainage she is to call back. Will provide numbing cream, warned this may burn.   - Ambulatory referral to General Surgery - lidocaine-hydrocortisone (ANAMANTEL HC) 3-0.5 % CREA; Place 1 Applicatorful rectally 2 (two) times daily.  Dispense: 7 g; Refill: 0    Follow up  plan: Return if symptoms worsen or fail to improve.  Carles Collet, PA-C [facility] 05/12/2018, 8:36 AM

## 2018-05-18 ENCOUNTER — Telehealth: Payer: Self-pay | Admitting: Physician Assistant

## 2018-05-18 NOTE — Telephone Encounter (Signed)
Didn't prescribe anusol, this is an old Rx. Prescribed lidocaine and HC cream, do not know of a generic for this.

## 2018-05-18 NOTE — Telephone Encounter (Signed)
Copied from [place] [id number]. Topic: Inquiry >> May 12, 2018  9:25 AM Pricilla Handler wrote: Reason for CRM: Patient called stating that the prescription for hydrocortisone (ANUSOL-HC) 2.5 % rectal cream that she was prescribed during her visit this morning with Fabio Bering is not covered by her insurance. Patient wants to know if there is a generic that can be prescribed. Please call patient at [phone].       Thank You!!!

## 2018-05-19 ENCOUNTER — Encounter: Payer: Self-pay | Admitting: Obstetrics and Gynecology

## 2018-05-22 NOTE — Telephone Encounter (Signed)
Unable to contact patient. Left detailed message on patient's cell.  DPR Reviewed.

## 2018-05-23 ENCOUNTER — Telehealth: Payer: Self-pay

## 2018-05-23 MED ORDER — DELZICOL 400 MG PO CPDR: 800.0000 mg | DELAYED_RELEASE_CAPSULE | Freq: Two times a day (BID) | ORAL | 1 refills | Status: DC

## 2018-05-23 NOTE — Telephone Encounter (Signed)
Rx refilled.

## 2018-05-23 NOTE — Telephone Encounter (Signed)
Fax from pharmacy.  Requesting Delzicol 400mg  capsule as a cost saving medication.

## 2018-05-24 ENCOUNTER — Encounter: Payer: Self-pay | Admitting: Surgery

## 2018-05-24 ENCOUNTER — Ambulatory Visit: Payer: BLUE CROSS/BLUE SHIELD | Admitting: Surgery

## 2018-05-24 VITALS — BP 130/89 | HR 97 | Ht 64.0 in | Wt 182.0 lb

## 2018-05-24 DIAGNOSIS — K602 Anal fissure, unspecified: Secondary | ICD-10-CM

## 2018-05-24 MED ORDER — DILTIAZEM GEL 2 %: 1.0000 "application " | Freq: Two times a day (BID) | CUTANEOUS | 1 refills | Status: DC

## 2018-05-24 NOTE — Patient Instructions (Addendum)
Return in one months. Diltiazem cream sent to Pharmacy The patient is aware to call back for any questions or concerns.

## 2018-05-24 NOTE — Progress Notes (Signed)
Surgical Clinic History and Physical  Referring provider:  Volney American, PA-C [place], [place] [address]  HISTORY OF PRESENT ILLNESS (HPI):  37 y.o. female with a history of reportedly well-controlled ulcerative colitis on Delzicol x 4 years presents for evaluation of peri-anal pain. Patient reports she has had surgery twice for a recurrent pilonidal cyst and, though she denies any history of peri-anal bulge, itching, or hemorrhoids (the latter of which she denies adamantly), was previously prescribed Anusol without any relief. Earlier this week, patient was also prescribed topical lidocaine cream, which helped a little at first until she stopped using it. Patient says she's never before experienced anything similar, but she reports a chronic history of thin loose BM's with straining and recently a small amount of bright red blood on toilet paper. Patient otherwise denies abdominal pain or distention, N/V, fever/chills, CP, or SOB.  PAST MEDICAL HISTORY (PMH):  Past Medical History:  Diagnosis Date  . ADHD (attention deficit hyperactivity disorder)   . Anxiety and depression 09/18/2017  . Controlled substance agreement broken 01/30/2018  . History of stomach ulcers   . 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 Hydronephrosis, left 11/06/2016  . Myalgia   . Polyarthralgia 08/31/2017  . Polycystic disease, ovaries   . Pulmonary nodules 08/25/2017   right lung, repeat imaging in 1 year  . Ulcerative colitis ([place])      PAST SURGICAL HISTORY ([place]):  Past Surgical History:  Procedure Laterality Date  . CYSTOSCOPY WITH STENT PLACEMENT Left 11/06/2016   Procedure: CYSTOSCOPY WITH STENT PLACEMENT;  Surgeon: Nickie Retort, MD;  Location: [facility] ORS;  Service: Urology;  Laterality: Left;  . CYSTOSCOPY/URETEROSCOPY/HOLMIUM LASER/STENT PLACEMENT Left 11/27/2016   Procedure: CYSTOSCOPY/URETEROSCOPY/HOLMIUM LASER/STENT  rePLACEMENT;  Surgeon: Hollice Espy, MD;  Location: [facility] ORS;  Service:  Urology;  Laterality: Left;  . Surgery for polycystic ovary       MEDICATIONS:  Prior to Admission medications   Medication Sig Start Date End Date Taking? Authorizing Provider  acyclovir (ZOVIRAX) 800 MG tablet Take 1 tablet (800 mg total) by mouth 2 (two) times daily. 10/21/17  Yes Volney American, PA-C  acyclovir ointment (ZOVIRAX) 5 % Apply 1 application topically every 3 (three) hours. 11/18/17  Yes Volney American, PA-C  amphetamine-dextroamphetamine (ADDERALL) 20 MG tablet Take 1 tablet (20 mg total) by mouth 2 (two) times daily. 11/09/17  Yes Volney American, PA-C  DELZICOL 400 MG CPDR DR capsule Take 2 capsules (800 mg total) by mouth 2 (two) times daily. 05/23/18  Yes Volney American, PA-C  escitalopram (LEXAPRO) 20 MG tablet Take 1 tablet (20 mg total) by mouth daily. 01/27/18  Yes Volney American, PA-C  hydrocortisone (ANUSOL-HC) 2.5 % rectal cream Place 1 application rectally 2 (two) times daily. 02/22/18  Yes Volney American, PA-C  metFORMIN (GLUCOPHAGE XR) 500 MG 24 hr tablet Take 1 tablet (500 mg total) by mouth daily with breakfast. 02/22/18  Yes Volney American, PA-C  norgestrel-ethinyl estradiol (LO/OVRAL,CRYSELLE) 0.3-30 MG-MCG tablet Take 1 tablet by mouth daily. 10/21/17  Yes Volney American, PA-C  spironolactone (ALDACTONE) 100 MG tablet Take 1 tablet (100 mg total) by mouth daily. 10/21/17  Yes Volney American, PA-C  traZODone (DESYREL) 50 MG tablet Take 0.5-1 tablets (25-50 mg total) by mouth at bedtime as needed for sleep. 02/22/18  Yes Volney American, PA-C  diltiazem 2 % GEL Apply 1 application topically 2 (two) times daily. 05/24/18   Vickie Epley, MD  lidocaine-hydrocortisone (ANAMANTEL HC) 3-0.5 % CREA Place 1 Applicatorful rectally 2 (two) times daily. Patient not taking: Reported on 05/24/2018 05/12/18   Trinna Post, PA-C     ALLERGIES:  Allergies  Allergen Reactions  . Latex Dermatitis  .  Sulfamethoxazole-Trimethoprim Other (See Comments)    Joint pain     SOCIAL HISTORY:  Social History   Socioeconomic History  . Marital status: Single    Spouse name: Not on file  . Number of children: Not on file  . Years of education: Not on file  . Highest education level: Not on file  Occupational History  . Not on file  Social Needs  . Financial resource strain: Not on file  . Food insecurity:    Worry: Not on file    Inability: Not on file  . Transportation needs:    Medical: Not on file    Non-medical: Not on file  Tobacco Use  . Smoking status: Current Every Day Smoker    Packs/day: 1.00    Types: Cigarettes  . Smokeless tobacco: Never Used  Substance and Sexual Activity  . Alcohol use: Yes    Alcohol/week: 2.4 oz    Types: 4 Standard drinks or equivalent per week  . Drug use: No  . Sexual activity: Yes    Birth control/protection: Pill  Lifestyle  . Physical activity:    Days per week: Not on file    Minutes per session: Not on file  . Stress: Not on file  Relationships  . Social connections:    Talks on phone: Not on file    Gets together: Not on file    Attends religious service: Not on file    Active member of club or organization: Not on file    Attends meetings of clubs or organizations: Not on file    Relationship status: Not on file  . Intimate partner violence:    Fear of current or ex partner: Not on file    Emotionally abused: Not on file    Physically abused: Not on file    Forced sexual activity: Not on file  Other Topics Concern  . Not on file  Social History Narrative  . Not on file    The patient currently resides (home / rehab facility / nursing home): Home The patient normally is (ambulatory / bedbound): Ambulatory  FAMILY HISTORY:  Family History  Problem Relation Age of Onset  . Kidney Stones Father   . Cancer Maternal Grandmother   . Heart disease Paternal Aunt   . Kidney cancer Neg Hx   . Kidney disease Neg Hx   .  Prostate cancer Neg Hx     Otherwise negative/non-contributory.  REVIEW OF SYSTEMS:  Constitutional: denies any other weight loss, fever, chills, or sweats  Eyes: denies any other vision changes, history of eye injury  ENT: denies sore throat, hearing problems  Respiratory: denies shortness of breath, wheezing  Cardiovascular: denies chest pain, palpitations  Gastrointestinal: abdominal pain, N/V, and bowel function as per HPI Musculoskeletal: denies any other joint pains or cramps  Skin: Denies any other rashes or skin discolorations Neurological: denies any other headache, dizziness, weakness  Psychiatric: Denies any other depression, anxiety   All other review of systems were otherwise negative   VITAL SIGNS:  BP 130/89   Pulse 97   Ht 5\' 4"  (1.626 m)   Wt 182 lb (82.6 kg)   BMI 31.24 kg/m   PHYSICAL EXAM:  Constitutional:  -- Normal  body habitus  -- Awake, alert, and oriented x3  Eyes:  -- Pupils equally round and reactive to light  -- No scleral icterus  Ear, nose, throat:  -- No jugular venous distension -- No nasal drainage, bleeding Pulmonary:  -- No crackles  -- Equal breath sounds bilaterally -- Breathing non-labored at rest Cardiovascular:  -- S1, S2 present  -- No pericardial rubs  Gastrointestinal:  -- Abdomen soft, nontender, non-distended, no guarding/rebound  -- No abdominal masses appreciated, pulsatile or otherwise  Anorectal -- Left posterior anal fissure -- Hypertonic anal sphincter tone Musculoskeletal and Integumentary:  -- Wounds or skin discoloration: None appreciated except as described above (anorectal) -- Extremities: B/L UE and LE FROM, hands and feet warm, no edema  Neurologic:  -- Motor function: Intact and symmetric -- Sensation: Intact and symmetric  Labs:  CBC Latest Ref Rng & Units 08/23/2017 11/27/2016 11/26/2016  WBC 3.6 - 11.0 K/uL 10.8 5.5 7.2  Hemoglobin 12.0 - 16.0 g/dL 15.0 11.3(L) 13.0  Hematocrit 35.0 - 47.0 % 43.5  33.1(L) 37.7  Platelets 150 - 440 K/uL 201 204 253   CMP Latest Ref Rng & Units 08/23/2017 05/06/2017 11/27/2016  Glucose 65 - 99 mg/dL 88 97 89  BUN 6 - 20 mg/dL 14 11 9   Creatinine 0.44 - 1.00 mg/dL 0.87 0.81 0.66  Sodium 135 - 145 mmol/L 135 140 137  Potassium 3.5 - 5.1 mmol/L 4.1 4.8 3.8  Chloride 101 - 111 mmol/L 98(L) 102 109  CO2 22 - 32 mmol/L 29 23 25   Calcium 8.9 - 10.3 mg/dL 8.9 9.9 8.3(L)  Total Protein 6.5 - 8.1 g/dL 6.7 7.0 -  Total Bilirubin 0.3 - 1.2 mg/dL 0.2(L) 0.4 -  Alkaline Phos 38 - 126 U/L 40 63 -  AST 15 - 41 U/L 34 17 -  ALT 14 - 54 U/L 29 11 -    Imaging studies: No new pertinent imaging studies available for review   Assessment/Plan:  37 y.o. female with recently symptomatic anal fissure, complicated by co-morbidities including reportedly well-controlled ulcerative colitis on Delzicol x 4 years, polycystic ovarian disease, Left hydronephrosis, GERD with PUD, polyarthralgia with myalgia, history of controlled substance abuse, generalized anxiety disorder, major depression disorder, ADHD, and chronic ongoing tobacco abuse (smoking).   - topical diltiazem + lidocaine cream routine, not prn  - maintain hydration and high fiber diet + stool softener prn  - discussed additional treatments including Botox injection(s) and lateral anal sphincterotomy   - return to clinic 1 month, instructed to call if any questions or concerns  All of the above recommendations were discussed with the patient, and all of patient's questions were answered to her expressed satisfaction.  Thank you for the opportunity to participate in this patient's care.  -- Marilynne Drivers Rosana Hoes, MD, [place]: [facility] - Partnering for exceptional care. Office: [phone]

## 2018-06-04 ENCOUNTER — Encounter: Payer: Self-pay | Admitting: Surgery

## 2018-06-19 ENCOUNTER — Encounter: Payer: Self-pay | Admitting: Family Medicine

## 2018-06-19 ENCOUNTER — Ambulatory Visit: Payer: BLUE CROSS/BLUE SHIELD | Admitting: Family Medicine

## 2018-06-19 VITALS — BP 109/76 | HR 80 | Temp 98.9°F | Wt 182.0 lb

## 2018-06-19 DIAGNOSIS — J0100 Acute maxillary sinusitis, unspecified: Secondary | ICD-10-CM

## 2018-06-19 MED ORDER — AMOXICILLIN-POT CLAVULANATE 875-125 MG PO TABS: 1.0000 | ORAL_TABLET | Freq: Two times a day (BID) | ORAL | 0 refills | Status: DC

## 2018-06-19 MED ORDER — BENZONATATE 200 MG PO CAPS: 200.0000 mg | ORAL_CAPSULE | Freq: Three times a day (TID) | ORAL | 0 refills | Status: DC | PRN

## 2018-06-19 MED ORDER — NORGESTREL-ETHINYL ESTRADIOL 0.3-30 MG-MCG PO TABS: 1.0000 | ORAL_TABLET | Freq: Every day | ORAL | 4 refills | Status: DC

## 2018-06-19 NOTE — Progress Notes (Signed)
BP 109/76   Pulse 80   Temp 98.9 F (37.2 C) (Oral)   Wt 182 lb (82.6 kg)   SpO2 98%   BMI 31.24 kg/m    Subjective:    Patient ID: Tracy Whitaker, female    DOB: [birth year], 37 y.o.   MRN: [id number]  HPI: Tracy Whitaker is a 37 y.o. female  Chief Complaint  Patient presents with  . URI    x 2 week,  head/chest congestion, productice cough   Significant congestion, fatigue, SOB, headache, cough, sinus pain and pressure x 2-3 weeks. Taking benadryl and mucinex with no relief. Denies fever, chills,Cp, dizziness. Sick contacts at home. Current smoker.   Relevant past medical, surgical, family and social history reviewed and updated as indicated. Interim medical history since our last visit reviewed. Allergies and medications reviewed and updated.  Review of Systems  Per HPI unless specifically indicated above     Objective:    BP 109/76   Pulse 80   Temp 98.9 F (37.2 C) (Oral)   Wt 182 lb (82.6 kg)   SpO2 98%   BMI 31.24 kg/m   Wt Readings from Last 3 Encounters:  06/19/18 182 lb (82.6 kg)  05/24/18 182 lb (82.6 kg)  05/12/18 181 lb 11.2 oz (82.4 kg)    Physical Exam  Constitutional: She is oriented to person, place, and time. She appears well-developed and well-nourished.  HENT:  Head: Atraumatic.  Right Ear: External ear normal.  Left Ear: External ear normal.  B/l maxillary sinuses ttp Oropharynx and nasal mucosa erythematous and edematous   Eyes: Pupils are equal, round, and reactive to light. EOM are normal.  B/l conjunctiva injected  Neck: Normal range of motion. Neck supple.  Cardiovascular: Normal rate and regular rhythm.  Pulmonary/Chest: Effort normal and breath sounds normal. No respiratory distress.  Musculoskeletal: Normal range of motion.  Neurological: She is alert and oriented to person, place, and time.  Skin: Skin is warm and dry. She is not diaphoretic.  Psychiatric: She has a normal mood and affect. Her behavior is normal.    Nursing note and vitals reviewed.   Results for orders placed or performed in visit on 02/22/18  Microscopic Examination  Result Value Ref Range   WBC, UA 6-10 (A) 0 - 5 /hpf   RBC, UA 0-2 0 - 2 /hpf   Epithelial Cells (non renal) 0-10 0 - 10 /hpf   Bacteria, UA Few None seen/Few  Urine Culture, Reflex  Result Value Ref Range   Urine Culture, Routine Final report    Organism ID, Bacteria Comment   UA/M w/rflx Culture, Routine  Result Value Ref Range   Specific Gravity, UA 1.020 1.005 - 1.030   pH, UA 5.0 5.0 - 7.5   Color, UA Orange Yellow   Appearance Ur Hazy (A) Clear   Leukocytes, UA 3+ (A) Negative   Protein, UA 2+ (A) Negative/Trace   Glucose, UA 2+ (A) Negative   Ketones, UA 1+ (A) Negative   RBC, UA Trace (A) Negative   Urobilinogen, Ur 4.0 (H) 0.2 - 1.0 mg/dL   Nitrite, UA Positive (A) Negative   Microscopic Examination See below:    Urinalysis Reflex Comment       Assessment & Plan:   Problem List Items Addressed This Visit    None    Visit Diagnoses    Acute maxillary sinusitis, recurrence not specified    -  Primary   Tx with augmentin, mucinex, tessalon  perles, nasal sprays. Supportive care reviewed. F/u if worsening or no improvement   Relevant Medications   amoxicillin-clavulanate (AUGMENTIN) 875-125 MG tablet   benzonatate (TESSALON) 200 MG capsule       Follow up plan: Return if symptoms worsen or fail to improve.

## 2018-06-20 ENCOUNTER — Telehealth: Payer: Self-pay | Admitting: Surgery

## 2018-06-20 ENCOUNTER — Ambulatory Visit: Payer: BLUE CROSS/BLUE SHIELD | Admitting: Surgery

## 2018-06-20 NOTE — Telephone Encounter (Signed)
Patient has called and left a message on the voicemail to reschedule her appt that is scheduled for 06/22/18 with Dr Rosana Hoes. Patient's number that was left on voicemail is [phone].

## 2018-06-20 NOTE — Telephone Encounter (Signed)
Left a message for the patient to call the office.

## 2018-06-20 NOTE — Patient Instructions (Signed)
Follow up as needed

## 2018-06-22 ENCOUNTER — Encounter: Payer: Self-pay | Admitting: Family Medicine

## 2018-06-22 ENCOUNTER — Ambulatory Visit: Payer: BLUE CROSS/BLUE SHIELD | Admitting: Surgery

## 2018-06-23 ENCOUNTER — Other Ambulatory Visit: Payer: Self-pay | Admitting: Family Medicine

## 2018-06-23 MED ORDER — PREDNISONE 20 MG PO TABS: 40.0000 mg | ORAL_TABLET | Freq: Every day | ORAL | 0 refills | Status: DC

## 2018-06-23 MED ORDER — DOXYCYCLINE HYCLATE 100 MG PO TABS: 100.0000 mg | ORAL_TABLET | Freq: Two times a day (BID) | ORAL | 0 refills | Status: DC

## 2018-06-28 NOTE — Telephone Encounter (Signed)
Left another message for the patient to call the office.

## 2018-06-30 ENCOUNTER — Encounter: Payer: Self-pay | Admitting: Obstetrics and Gynecology

## 2018-06-30 ENCOUNTER — Ambulatory Visit (INDEPENDENT_AMBULATORY_CARE_PROVIDER_SITE_OTHER): Payer: BLUE CROSS/BLUE SHIELD | Admitting: Obstetrics and Gynecology

## 2018-06-30 VITALS — BP 118/64 | HR 82 | Ht 64.0 in | Wt 181.4 lb

## 2018-06-30 DIAGNOSIS — Z01419 Encounter for gynecological examination (general) (routine) without abnormal findings: Secondary | ICD-10-CM | POA: Diagnosis not present

## 2018-06-30 MED ORDER — AMPHETAMINE-DEXTROAMPHETAMINE 20 MG PO TABS: 20.0000 mg | ORAL_TABLET | Freq: Two times a day (BID) | ORAL | 0 refills | Status: DC

## 2018-06-30 NOTE — Patient Instructions (Signed)
Preventive Care 18-39 Years, Female Preventive care refers to lifestyle choices and visits with your health care provider that can promote health and wellness. What does preventive care include?  A yearly physical exam. This is also called an annual well check.  Dental exams once or twice a year.  Routine eye exams. Ask your health care provider how often you should have your eyes checked.  Personal lifestyle choices, including: ? Daily care of your teeth and gums. ? Regular physical activity. ? Eating a healthy diet. ? Avoiding tobacco and drug use. ? Limiting alcohol use. ? Practicing safe sex. ? Taking vitamin and mineral supplements as recommended by your health care provider. What happens during an annual well check? The services and screenings done by your health care provider during your annual well check will depend on your age, overall health, lifestyle risk factors, and family history of disease. Counseling Your health care provider may ask you questions about your:  Alcohol use.  Tobacco use.  Drug use.  Emotional well-being.  Home and relationship well-being.  Sexual activity.  Eating habits.  Work and work Statistician.  Method of birth control.  Menstrual cycle.  Pregnancy history.  Screening You may have the following tests or measurements:  Height, weight, and BMI.  Diabetes screening. This is done by checking your blood sugar (glucose) after you have not eaten for a while (fasting).  Blood pressure.  Lipid and cholesterol levels. These may be checked every 5 years starting at age 38.  Skin check.  Hepatitis C blood test.  Hepatitis B blood test.  Sexually transmitted disease (STD) testing.  BRCA-related cancer screening. This may be done if you have a family history of breast, ovarian, tubal, or peritoneal cancers.  Pelvic exam and Pap test. This may be done every 3 years starting at age 38. Starting at age 30, this may be done  every 5 years if you have a Pap test in combination with an HPV test.  Discuss your test results, treatment options, and if necessary, the need for more tests with your health care provider. Vaccines Your health care provider may recommend certain vaccines, such as:  Influenza vaccine. This is recommended every year.  Tetanus, diphtheria, and acellular pertussis (Tdap, Td) vaccine. You may need a Td booster every 10 years.  Varicella vaccine. You may need this if you have not been vaccinated.  HPV vaccine. If you are 39 or younger, you may need three doses over 6 months.  Measles, mumps, and rubella (MMR) vaccine. You may need at least one dose of MMR. You may also need a second dose.  Pneumococcal 13-valent conjugate (PCV13) vaccine. You may need this if you have certain conditions and were not previously vaccinated.  Pneumococcal polysaccharide (PPSV23) vaccine. You may need one or two doses if you smoke cigarettes or if you have certain conditions.  Meningococcal vaccine. One dose is recommended if you are age 68-21 years and a first-year college student living in a residence hall, or if you have one of several medical conditions. You may also need additional booster doses.  Hepatitis A vaccine. You may need this if you have certain conditions or if you travel or work in places where you may be exposed to hepatitis A.  Hepatitis B vaccine. You may need this if you have certain conditions or if you travel or work in places where you may be exposed to hepatitis B.  Haemophilus influenzae type b (Hib) vaccine. You may need this  if you have certain risk factors.  Talk to your health care provider about which screenings and vaccines you need and how often you need them. This information is not intended to replace advice given to you by your health care provider. Make sure you discuss any questions you have with your health care provider. Document Released: 01/04/2002 Document Revised:  07/28/2016 Document Reviewed: 09/09/2015 Elsevier Interactive Patient Education  2018 [employer].  

## 2018-06-30 NOTE — Progress Notes (Signed)
Subjective:   Tracy Whitaker is a 37 y.o. G42P0 Caucasian female here for a routine well-woman exam.  Patient's last menstrual period was 05/30/2018.    Current complaints: rectal cyst that burst a month ago.still feels different in that area.  PCP: L[facility]       Does need labs  Social History: Sexual: heterosexual Marital Status: divorced Living situation: with daughter Occupation: unknown occupation Tobacco/alcohol: no tobacco use Illicit drugs: no history of illicit drug use  The following portions of the patient's history were reviewed and updated as appropriate: allergies, current medications, past family history, past medical history, past social history, past surgical history and problem list.  Past Medical History Past Medical History:  Diagnosis Date  . ADHD (attention deficit hyperactivity disorder)   . Anxiety and depression 09/18/2017  . Controlled substance agreement broken 01/30/2018  . History of stomach ulcers   . 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 Hydronephrosis, left 11/06/2016  . Myalgia   . Polyarthralgia 08/31/2017  . Polycystic disease, ovaries   . Pulmonary nodules 08/25/2017   right lung, repeat imaging in 1 year  . Ulcerative colitis [facility])     Past Surgical History Past Surgical History:  Procedure Laterality Date  . CYSTOSCOPY WITH STENT PLACEMENT Left 11/06/2016   Procedure: CYSTOSCOPY WITH STENT PLACEMENT;  Surgeon: Nickie Retort, MD;  Location: [facility] ORS;  Service: Urology;  Laterality: Left;  . CYSTOSCOPY/URETEROSCOPY/HOLMIUM LASER/STENT PLACEMENT Left 11/27/2016   Procedure: CYSTOSCOPY/URETEROSCOPY/HOLMIUM LASER/STENT  rePLACEMENT;  Surgeon: Hollice Espy, MD;  Location: [facility] ORS;  Service: Urology;  Laterality: Left;  . Surgery for polycystic ovary      Gynecologic History G1P0  Patient's last menstrual period was 05/30/2018. Contraception: OCP (estrogen/progesterone) Last Pap: 2018. Results were: normal   Obstetric History OB History   Gravida Para Term Preterm AB Living  1            SAB TAB Ectopic Multiple Live Births               # Outcome Date GA Lbr Len/2nd Weight Sex Delivery Anes PTL Lv  1 Gravida 2008    F Vag-Spont       Current Medications Current Outpatient Medications on File Prior to Visit  Medication Sig Dispense Refill  . acyclovir (ZOVIRAX) 800 MG tablet Take 1 tablet (800 mg total) by mouth 2 (two) times daily. 180 tablet 3  . acyclovir ointment (ZOVIRAX) 5 % Apply 1 application topically every 3 (three) hours. 30 g 3  . amphetamine-dextroamphetamine (ADDERALL) 20 MG tablet Take 1 tablet (20 mg total) by mouth 2 (two) times daily. 60 tablet 0  . benzonatate (TESSALON) 200 MG capsule Take 1 capsule (200 mg total) by mouth 3 (three) times daily as needed. 40 capsule 0  . DELZICOL 400 MG CPDR DR capsule Take 2 capsules (800 mg total) by mouth 2 (two) times daily. 360 capsule 1  . doxycycline (VIBRA-TABS) 100 MG tablet Take 1 tablet (100 mg total) by mouth 2 (two) times daily. 20 tablet 0  . escitalopram (LEXAPRO) 20 MG tablet Take 1 tablet (20 mg total) by mouth daily. 90 tablet 1  . metFORMIN (GLUCOPHAGE XR) 500 MG 24 hr tablet Take 1 tablet (500 mg total) by mouth daily with breakfast. 90 tablet 1  . norgestrel-ethinyl estradiol (LO/OVRAL,CRYSELLE) 0.3-30 MG-MCG tablet Take 1 tablet by mouth daily. 3 Package 4  . predniSONE (DELTASONE) 20 MG tablet Take 2 tablets (40 mg total) by mouth daily with breakfast. 10 tablet 0  .  spironolactone (ALDACTONE) 100 MG tablet Take 1 tablet (100 mg total) by mouth daily. 90 tablet 3  . traZODone (DESYREL) 50 MG tablet Take 0.5-1 tablets (25-50 mg total) by mouth at bedtime as needed for sleep. 90 tablet 1  . diltiazem 2 % GEL Apply 1 application topically 2 (two) times daily. (Patient not taking: Reported on 06/30/2018) 30 g 1  . hydrocortisone (ANUSOL-HC) 2.5 % rectal cream Place 1 application rectally 2 (two) times daily. (Patient not taking: Reported on 06/30/2018) 30  g 0  . lidocaine-hydrocortisone (ANAMANTEL HC) 3-0.5 % CREA Place 1 Applicatorful rectally 2 (two) times daily. (Patient not taking: Reported on 06/30/2018) 7 g 0   No current facility-administered medications on file prior to visit.     Review of Systems Patient denies any headaches, blurred vision, shortness of breath, chest pain, abdominal pain, problems with bowel movements, urination, or intercourse.  Objective:  BP 118/64   Pulse 82   Ht 5' 4"  (1.626 m)   Wt 181 lb 6.4 oz (82.3 kg)   LMP 05/30/2018   BMI 31.14 kg/m  Physical Exam  General:  Well developed, well nourished, no acute distress. She is alert and oriented x3. Skin:  Warm and dry Neck:  Midline trachea, no thyromegaly or nodules Cardiovascular: Regular rate and rhythm, no murmur heard Lungs:  Effort normal, all lung fields clear to auscultation bilaterally Breasts:  No dominant palpable mass, retraction, or nipple discharge Abdomen:  Soft, non tender, no hepatosplenomegaly or masses Pelvic:  External genitalia is normal in appearance.  The vagina is normal in appearance. The cervix is bulbous, no CMT.  Thin prep pap is not done. Uterus is felt to be normal size, shape, and contour.  No adnexal masses or tenderness noted. Extremities:  No swelling or varicosities noted Psych:  She has a normal mood and affect  Assessment:   Healthy well-woman exam ADD  Plan:  Labs obtained and will follow up accordingly. 4 months adderall refilled. F/U 1 year for AE, or sooner if needed   Melody Rockney Ghee, CNM

## 2018-07-01 LAB — TSH: TSH: 0.798 u[IU]/mL (ref 0.450–4.500)

## 2018-07-01 LAB — LIPID PANEL
Chol/HDL Ratio: 3.9 ratio (ref 0.0–4.4)
Cholesterol, Total: 208 mg/dL — ABNORMAL HIGH (ref 100–199)
HDL: 53 mg/dL (ref >39)
LDL Calculated: 123 mg/dL — ABNORMAL HIGH (ref 0–99)
Triglycerides: 159 mg/dL — ABNORMAL HIGH (ref 0–149)
VLDL Cholesterol Cal: 32 mg/dL (ref 5–40)

## 2018-07-01 LAB — COMPREHENSIVE METABOLIC PANEL
ALT: 9 IU/L (ref 0–32)
AST: 10 IU/L (ref 0–40)
Albumin/Globulin Ratio: 1.8 (ref 1.2–2.2)
Albumin: 3.9 g/dL (ref 3.5–5.5)
Alkaline Phosphatase: 54 IU/L (ref 39–117)
BUN/Creatinine Ratio: 13 (ref 9–23)
BUN: 13 mg/dL (ref 6–20)
Bilirubin Total: 0.3 mg/dL (ref 0.0–1.2)
CO2: 23 mmol/L (ref 20–29)
Calcium: 9.4 mg/dL (ref 8.7–10.2)
Chloride: 103 mmol/L (ref 96–106)
Creatinine, Ser: 0.98 mg/dL (ref 0.57–1.00)
GFR calc Af Amer: 86 mL/min/{1.73_m2} (ref >59)
GFR calc non Af Amer: 74 mL/min/{1.73_m2} (ref >59)
Globulin, Total: 2.2 g/dL (ref 1.5–4.5)
Glucose: 80 mg/dL (ref 65–99)
Potassium: 3.9 mmol/L (ref 3.5–5.2)
Sodium: 141 mmol/L (ref 134–144)
Total Protein: 6.1 g/dL (ref 6.0–8.5)

## 2018-07-01 LAB — HEMOGLOBIN A1C
Est. average glucose Bld gHb Est-mCnc: 100 mg/dL
Hgb A1c MFr Bld: 5.1 % (ref 4.8–5.6)

## 2018-07-12 ENCOUNTER — Encounter: Payer: Self-pay | Admitting: Surgery

## 2018-07-12 NOTE — Telephone Encounter (Signed)
Left another message for the patient to contact our office, letter has also been mailed to the patient for the patient to contact our office.

## 2018-08-23 ENCOUNTER — Telehealth: Payer: Self-pay | Admitting: Obstetrics and Gynecology

## 2018-08-23 ENCOUNTER — Encounter: Payer: Self-pay | Admitting: *Deleted

## 2018-08-23 NOTE — Telephone Encounter (Signed)
Patient called stating she is out of refills on the medication for bacterial infections.

## 2018-08-23 NOTE — Telephone Encounter (Signed)
Sent pt my chart message about needing appointment for treatment

## 2018-08-28 ENCOUNTER — Telehealth: Payer: Self-pay | Admitting: Obstetrics and Gynecology

## 2018-08-28 ENCOUNTER — Other Ambulatory Visit: Payer: BLUE CROSS/BLUE SHIELD

## 2018-08-28 ENCOUNTER — Encounter: Payer: Self-pay | Admitting: *Deleted

## 2018-08-28 NOTE — Telephone Encounter (Signed)
The patient asked to be added to the lab schedule today at 2:15 to do a urine drop off b/c she thinks she has a bacterial infection.  She wanted to be sure the nurse knew b/c she needs an antibiotic if possible, please advise, thanks.

## 2018-08-28 NOTE — Telephone Encounter (Signed)
Sent pt my chart message need to know if she is having burning with urination or vaginal d/c???

## 2018-08-29 ENCOUNTER — Ambulatory Visit: Payer: BLUE CROSS/BLUE SHIELD | Admitting: Family Medicine

## 2018-08-29 ENCOUNTER — Encounter: Payer: Self-pay | Admitting: *Deleted

## 2018-09-26 ENCOUNTER — Other Ambulatory Visit: Payer: Self-pay | Admitting: Family Medicine

## 2018-09-26 NOTE — Telephone Encounter (Signed)
Requested medication (s) are due for refill today: yes  Requested medication (s) are on the active medication list: yes  Last refill:  02/22/18  Future visit scheduled: no  Notes to clinic:  Medicine for sleep   Requested Prescriptions  Pending Prescriptions Disp Refills   traZODone (DESYREL) 50 MG tablet [Pharmacy Med Name: TRAZODONE 50MG  TABLETS] 90 tablet 0    Sig: TAKE 1/2 TO 1 TABLET BY MOUTH AT BEDTIME AS NEEDED FOR SLEEP     Psychiatry: Antidepressants - Serotonin Modulator Passed - 09/26/2018  1:30 PM      Passed - Completed PHQ-2 or PHQ-9 in the last 360 days.      Passed - Valid encounter within last 6 months    Recent Outpatient Visits          3 months ago Acute maxillary sinusitis, recurrence not specified   [facility] Merrie Roof , [place]   4 months ago Perianal abscess   [facility] Carles Collet [facility], [place]   7 months ago Acute lower UTI   [facility] Merrie Roof [place], [place]   8 months ago Possible exposure to STD   [facility], [place], [place]   10 months ago Attention deficit hyperactivity disorder (ADHD), unspecified ADHD type   [facility] Merrie Roof [place], [place]

## 2018-10-02 ENCOUNTER — Encounter: Payer: Self-pay | Admitting: Family Medicine

## 2018-10-03 ENCOUNTER — Other Ambulatory Visit: Payer: Self-pay | Admitting: Family Medicine

## 2018-10-03 MED ORDER — DELZICOL 400 MG PO CPDR: 800.0000 mg | DELAYED_RELEASE_CAPSULE | Freq: Two times a day (BID) | ORAL | 1 refills | Status: DC

## 2018-10-30 ENCOUNTER — Encounter: Payer: Self-pay | Admitting: Family Medicine

## 2018-10-30 ENCOUNTER — Ambulatory Visit: Payer: BLUE CROSS/BLUE SHIELD | Admitting: Family Medicine

## 2018-10-30 VITALS — BP 132/84 | HR 67 | Temp 98.6°F | Ht 64.0 in | Wt 193.6 lb

## 2018-10-30 DIAGNOSIS — B9689 Other specified bacterial agents as the cause of diseases classified elsewhere: Secondary | ICD-10-CM | POA: Diagnosis not present

## 2018-10-30 DIAGNOSIS — N760 Acute vaginitis: Secondary | ICD-10-CM

## 2018-10-30 LAB — WET PREP FOR TRICH, YEAST, CLUE
Clue Cell Exam: POSITIVE — AB
Trichomonas Exam: NEGATIVE
Yeast Exam: NEGATIVE

## 2018-10-30 NOTE — Progress Notes (Signed)
   BP 132/84   Pulse 67   Temp 98.6 F (37 C) (Oral)   Ht 5\' 4"  (1.626 m)   Wt 193 lb 9.6 oz (87.8 kg)   SpO2 98%   BMI 33.23 kg/m    Subjective:    Patient ID: Tracy Whitaker, female    DOB: 1981-10-08, 37 y.o.   MRN: [id number]  HPI: Tracy Whitaker is a 37 y.o. female  Chief Complaint  Patient presents with  . Vaginal Discharge    for 1 month, has tried OTC treatment with relief but discharge came back    Here today for about 1 month of vaginal discharge and lower abdominal cramping. Long hx of recurrent BV, states this feels similar. Denies dysuria, concern for STIs, hematuria, N/V. Used the AZO for yeast with probiotics which helped temporarily but sxs returned.   Relevant past medical, surgical, family and social history reviewed and updated as indicated. Interim medical history since our last visit reviewed. Allergies and medications reviewed and updated.  Review of Systems  Per HPI unless specifically indicated above     Objective:    BP 132/84   Pulse 67   Temp 98.6 F (37 C) (Oral)   Ht 5\' 4"  (1.626 m)   Wt 193 lb 9.6 oz (87.8 kg)   SpO2 98%   BMI 33.23 kg/m   Wt Readings from Last 3 Encounters:  10/30/18 193 lb 9.6 oz (87.8 kg)  06/30/18 181 lb 6.4 oz (82.3 kg)  06/19/18 182 lb (82.6 kg)    Physical Exam  Constitutional: She is oriented to person, place, and time. She appears well-developed and well-nourished. No distress.  HENT:  Head: Atraumatic.  Eyes: Conjunctivae and EOM are normal.  Neck: Normal range of motion. Neck supple.  Cardiovascular: Normal rate, regular rhythm and normal heart sounds.  Pulmonary/Chest: Effort normal and breath sounds normal.  Abdominal: Soft. Bowel sounds are normal. There is no tenderness.  Musculoskeletal: Normal range of motion.  Neurological: She is alert and oriented to person, place, and time.  Skin: Skin is warm and dry.  Psychiatric: She has a normal mood and affect. Her behavior is normal.  Nursing  note and vitals reviewed.   Results for orders placed or performed in visit on 10/30/18  WET PREP FOR [place], YEAST, CLUE  Result Value Ref Range   Trichomonas Exam Negative Negative   Yeast Exam Negative Negative   Clue Cell Exam Positive (A) Negative      Assessment & Plan:   Problem List Items Addressed This Visit    None    Visit Diagnoses    BV (bacterial vaginosis)    -  Primary   Tx with flagyl, will send in vaginal metronidazole if sxs are not improved upon completion. Start daily probiotic supplement, vaginal hygiene reviewed   Relevant Orders   WET PREP FOR [place], [place] (Completed)       Follow up plan: Return if symptoms worsen or fail to improve.

## 2018-10-31 ENCOUNTER — Telehealth: Payer: Self-pay | Admitting: Obstetrics and Gynecology

## 2018-10-31 ENCOUNTER — Encounter: Payer: Self-pay | Admitting: Family Medicine

## 2018-10-31 ENCOUNTER — Other Ambulatory Visit: Payer: Self-pay | Admitting: *Deleted

## 2018-10-31 ENCOUNTER — Telehealth: Payer: Self-pay | Admitting: Family Medicine

## 2018-10-31 MED ORDER — AMPHETAMINE-DEXTROAMPHETAMINE 20 MG PO TABS: 20.0000 mg | ORAL_TABLET | Freq: Two times a day (BID) | ORAL | 0 refills | Status: DC

## 2018-10-31 MED ORDER — METRONIDAZOLE 500 MG PO TABS: 500.0000 mg | ORAL_TABLET | Freq: Two times a day (BID) | ORAL | 0 refills | Status: DC

## 2018-10-31 NOTE — Telephone Encounter (Signed)
Rx sent 

## 2018-10-31 NOTE — Telephone Encounter (Signed)
Copied from [place] [phone]. Topic: Quick Communication - Rx Refill/Question >> Oct 31, 2018 12:12 PM Sheran Luz wrote: Patient states at [place] yesterday she was advised an antibiotic (Flagyl) was going to be sent to pharmacy.Pharmacy states they have not received request. Patient would like to know if this medication can be sent today. Please advise.

## 2018-10-31 NOTE — Telephone Encounter (Signed)
Put on Mels desk for signature

## 2018-10-31 NOTE — Telephone Encounter (Signed)
The patient is asking for a refill on her Adderall Rx printed out and call her for pick up please advise, thanks.

## 2018-11-01 ENCOUNTER — Encounter: Payer: Self-pay | Admitting: *Deleted

## 2018-11-08 ENCOUNTER — Other Ambulatory Visit: Payer: Self-pay | Admitting: Family Medicine

## 2018-11-08 ENCOUNTER — Encounter: Payer: Self-pay | Admitting: Family Medicine

## 2018-11-08 MED ORDER — OSELTAMIVIR PHOSPHATE 75 MG PO CAPS: 75.0000 mg | ORAL_CAPSULE | Freq: Two times a day (BID) | ORAL | 0 refills | Status: DC

## 2018-11-10 ENCOUNTER — Encounter: Payer: Self-pay | Admitting: Family Medicine

## 2018-12-04 ENCOUNTER — Other Ambulatory Visit: Payer: Self-pay | Admitting: Family Medicine

## 2018-12-04 ENCOUNTER — Encounter: Payer: Self-pay | Admitting: Family Medicine

## 2018-12-04 MED ORDER — METRONIDAZOLE 0.75 % VA GEL: 1.0000 | Freq: Two times a day (BID) | VAGINAL | 6 refills | Status: DC

## 2018-12-04 NOTE — Telephone Encounter (Signed)
I was trying to write a 2 week supply - whatever that works out to be is what I would like  Copied from [place] [phone]. Topic: General - Inquiry >> Dec 04, 2018  8:26 AM Berneta Levins wrote: Reason for CRM:   Ronalee Belts from [facility] calling.  States that the quantity of the metroNIDAZOLE (METROGEL VAGINAL) 0.75 % vaginal gel sent electronically came across as 4 boxes with 6 refills - he wants to make sure that this is correct. Ronalee Belts can be reached at [phone].

## 2018-12-08 ENCOUNTER — Telehealth: Payer: Self-pay | Admitting: Obstetrics and Gynecology

## 2018-12-08 ENCOUNTER — Encounter: Payer: Self-pay | Admitting: *Deleted

## 2018-12-08 NOTE — Telephone Encounter (Signed)
The patient called to ask about her prior authorization on her medication, and if it had been sent back to her pharmacy.  She states she has new insurance and that all her medications are having to have the PA's and that she is new at having to do this, and just making sure it went through, please advise, thanks.

## 2018-12-08 NOTE — Telephone Encounter (Signed)
Sent pt mychart message

## 2018-12-12 ENCOUNTER — Telehealth: Payer: Self-pay | Admitting: Obstetrics and Gynecology

## 2018-12-12 NOTE — Telephone Encounter (Signed)
Judson Roch called from [facility] to get a prior auth for this patient, and she was very adamant to speak to Amy; however, Amy was in clinic and had just pulled back a patient so I asked CM, office manager, to assist her.  CM quickly picked up and spoke with Judson Roch.  *NO FURTHER ACTION REQUIRED at this time*

## 2018-12-14 ENCOUNTER — Other Ambulatory Visit: Payer: Self-pay | Admitting: Family Medicine

## 2018-12-14 NOTE — Telephone Encounter (Signed)
Requested medication (s) are due for refill today no. Medication not due until 12/27/18. Routing to PCP for consideration.  Requested medication (s) are on the active medication list yes  Future visit scheduled    Requested Prescriptions  Pending Prescriptions Disp Refills   traZODone (DESYREL) 50 MG tablet [Pharmacy Med Name: TRAZODONE 50MG  TABLETS] 90 tablet 0    Sig: TAKE 1/2 TO 1 TABLET BY MOUTH AT BEDTIME AS NEEDED FOR SLEEP     Psychiatry: Antidepressants - Serotonin Modulator Passed - 12/14/2018 12:32 PM      Passed - Completed PHQ-2 or PHQ-9 in the last 360 days.      Passed - Valid encounter within last 6 months    Recent Outpatient Visits          1 month ago BV (bacterial vaginosis)   [place], [place], [place]   5 months ago Acute maxillary sinusitis, recurrence not specified   [facility] Merrie Roof [place], [place]   7 months ago Perianal abscess   [facility] Carles Collet [facility], [place]   9 months ago Acute lower UTI   [facility] Merrie Roof [place], [place]   10 months ago Possible exposure to STD   [facility], [place], [place]

## 2018-12-22 ENCOUNTER — Other Ambulatory Visit: Payer: Self-pay | Admitting: Family Medicine

## 2018-12-22 ENCOUNTER — Encounter: Payer: Self-pay | Admitting: Family Medicine

## 2018-12-22 NOTE — Telephone Encounter (Signed)
Requested Prescriptions  Pending Prescriptions Disp Refills  . CRYSELLE-28 0.3-30 MG-MCG tablet [Pharmacy Med Name: OFBPZWCH-85 TABLET] 112 tablet 3    Sig: TAKE 1 TABLET BY MOUTH EVERY DAY *SKIP PLACEBOS*     OB/GYN:  Contraceptives Passed - 12/22/2018  2:09 AM      Passed - Last BP in normal range    BP Readings from Last 1 Encounters:  10/30/18 132/84         Passed - Valid encounter within last 12 months    Recent Outpatient Visits          1 month ago BV (bacterial vaginosis)   [facility], [place], [place]   6 months ago Acute maxillary sinusitis, recurrence not specified   [place], [place], [place]   7 months ago Perianal abscess   [facility] Carles Collet [facility], [place]   10 months ago Acute lower UTI   [facility] Merrie Roof [place], [place]   10 months ago Possible exposure to STD   [facility], [place], [place]

## 2018-12-27 ENCOUNTER — Other Ambulatory Visit: Payer: Self-pay | Admitting: Family Medicine

## 2018-12-27 NOTE — Telephone Encounter (Signed)
Approved per protocol. Requested Prescriptions  Pending Prescriptions Disp Refills  . metFORMIN (GLUCOPHAGE-XR) 500 MG 24 hr tablet [Pharmacy Med Name: METFORMIN ER 500MG 24HR TABS] 90 tablet 1    Sig: TAKE 1 TABLET BY MOUTH DAILY WITH BREAKFAST     Endocrinology:  Diabetes - Biguanides Passed - 12/27/2018  1:39 PM      Passed - Cr in normal range and within 360 days    Creatinine, Ser  Date Value Ref Range Status  06/30/2018 0.98 0.57 - 1.00 mg/dL Final         Passed - HBA1C is between 0 and 7.9 and within 180 days    Hgb A1c MFr Bld  Date Value Ref Range Status  06/30/2018 5.1 4.8 - 5.6 % Final    Comment:             Prediabetes: 5.7 - 6.4          Diabetes: >6.4          Glycemic control for adults with diabetes: <7.0          Passed - eGFR in normal range and within 360 days    GFR calc Af Amer  Date Value Ref Range Status  06/30/2018 86 >59 mL/min/1.73 Final   GFR calc non Af Amer  Date Value Ref Range Status  06/30/2018 74 >59 mL/min/1.73 Final         Passed - Valid encounter within last 6 months    Recent Outpatient Visits          1 month ago BV (bacterial vaginosis)   [facility] Volney American, PA-C   6 months ago Acute maxillary sinusitis, recurrence not specified   [facility], [place]   7 months ago Perianal abscess   [place], [place], [place]   10 months ago Acute lower UTI   [facility] Merrie Roof [place], [place]   11 months ago Possible exposure to STD   [facility], [place], [place]

## 2019-01-09 ENCOUNTER — Encounter: Payer: Self-pay | Admitting: Family Medicine

## 2019-01-10 ENCOUNTER — Telehealth: Payer: Self-pay

## 2019-01-10 NOTE — Telephone Encounter (Signed)
PA for Mesalamine was submitted and denied via Cover My Meds. Message sent to patient asking if she has specifically tried and failed Apriso (this was stated on the denial in Cover My Meds). I sent a mychart message to the patient asking her and I am waiting on the response. Will resubmit PA if patient has tried and failed the above medication.   Key: [id number]

## 2019-01-12 ENCOUNTER — Ambulatory Visit: Payer: 59 | Admitting: Family Medicine

## 2019-01-12 ENCOUNTER — Encounter: Payer: Self-pay | Admitting: Family Medicine

## 2019-01-12 ENCOUNTER — Other Ambulatory Visit: Payer: Self-pay | Admitting: Family Medicine

## 2019-01-12 VITALS — BP 133/90 | HR 72 | Temp 98.1°F | Wt 200.8 lb

## 2019-01-12 DIAGNOSIS — G4700 Insomnia, unspecified: Secondary | ICD-10-CM | POA: Diagnosis not present

## 2019-01-12 DIAGNOSIS — F332 Major depressive disorder, recurrent severe without psychotic features: Secondary | ICD-10-CM | POA: Diagnosis not present

## 2019-01-12 DIAGNOSIS — K51919 Ulcerative colitis, unspecified with unspecified complications: Secondary | ICD-10-CM | POA: Diagnosis not present

## 2019-01-12 MED ORDER — METRONIDAZOLE 500 MG PO TABS: 500.0000 mg | ORAL_TABLET | Freq: Two times a day (BID) | ORAL | 0 refills | Status: DC

## 2019-01-12 MED ORDER — VENLAFAXINE HCL ER 75 MG PO CP24: 75.0000 mg | ORAL_CAPSULE | Freq: Every day | ORAL | 0 refills | Status: DC

## 2019-01-12 MED ORDER — MESALAMINE 400 MG PO CPDR: 800.0000 mg | DELAYED_RELEASE_CAPSULE | Freq: Two times a day (BID) | ORAL | 2 refills | Status: DC

## 2019-01-12 MED ORDER — MESALAMINE ER 0.375 G PO CP24: 375.0000 mg | ORAL_CAPSULE | Freq: Every day | ORAL | 5 refills | Status: DC

## 2019-01-12 MED ORDER — LURASIDONE HCL 20 MG PO TABS: 20.0000 mg | ORAL_TABLET | Freq: Every day | ORAL | 0 refills | Status: DC

## 2019-01-12 NOTE — Telephone Encounter (Signed)
P.A. was denied because this medicien is covered only if:  All of the following: 1. Your doctor submits medical records documenting that you have had an inadequate response, failed, or cannot use Apriso (date and duration of trial must be provide). 2. Your doctor provides and explanation for how the requested medication will work for you if you have failed on the covered therapeutically equivalent product which has the same active ingredient (for example, you had an adverse reaction to an inactive ingredient in the alternative product).

## 2019-01-12 NOTE — Progress Notes (Signed)
BP 133/90 (BP Location: Right Arm, Cuff Size: Normal)   Pulse 72   Temp 98.1 F (36.7 C) (Oral)   Wt 200 lb 12.8 oz (91.1 kg)   SpO2 97%   BMI 34.47 kg/m    Subjective:    Patient ID: Tracy Whitaker, female    DOB: Nov 29, 1980, 38 y.o.   MRN: [id number]  HPI: Tracy Whitaker is a 38 y.o. female  Chief Complaint  Patient presents with  . Depression  . Medication Problem    mesalamine not covered by ins, must try and fail Apriso   Moods and anxiety have been worsening - thinks the lexapro is no longer helping. Crying all the time, sleeping too much but also having sleep difficulties at bedtime, panicking when out in public. Seroquel was too much, caused grogginess in the morning. Trazodone is not enough. Has been to therapy in the past which was helpful. States she was diagnosed with bipolar in the past but not on anything for that. Denies SI/HI.   Also has been out of her UC medication due to an insurance coverage change. Has been stable on delzicol for years, abdominal pain and diarrhea since running out several seeks ago.   Depression screen [facility] 2/9 01/12/2019 05/12/2018  Decreased Interest 3 0  Down, Depressed, Hopeless 3 0  PHQ - 2 Score 6 0  Altered sleeping 3 -  Tired, decreased energy 3 -  Change in appetite 3 -  Feeling bad or failure about yourself  3 -  Trouble concentrating 3 -  Moving slowly or fidgety/restless 3 -  Suicidal thoughts 0 -  PHQ-9 Score 24 -   GAD 7 : Generalized Anxiety Score 01/12/2019  Nervous, Anxious, on Edge 3  Control/stop worrying 3  Worry too much - different things 3  Trouble relaxing 3  Restless 3  Easily annoyed or irritable 3  Afraid - awful might happen 3  Total GAD 7 Score 21  Anxiety Difficulty Extremely difficult     Past Medical History:  Diagnosis Date  . ADHD (attention deficit hyperactivity disorder)   . Anxiety and depression 09/18/2017  . Controlled substance agreement broken 01/30/2018  . History of stomach  ulcers   . 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 Hydronephrosis, left 11/06/2016  . Myalgia   . Polyarthralgia 08/31/2017  . Polycystic disease, ovaries   . Pulmonary nodules 08/25/2017   right lung, repeat imaging in 1 year  . Ulcerative colitis ([place])    Social History   Socioeconomic History  . Marital status: Single    Spouse name: Not on file  . Number of children: Not on file  . Years of education: Not on file  . Highest education level: Not on file  Occupational History  . Not on file  Social Needs  . Financial resource strain: Not on file  . Food insecurity:    Worry: Not on file    Inability: Not on file  . Transportation needs:    Medical: Not on file    Non-medical: Not on file  Tobacco Use  . Smoking status: Current Every Day Smoker    Packs/day: 1.00    Types: Cigarettes  . Smokeless tobacco: Never Used  Substance and Sexual Activity  . Alcohol use: Yes    Alcohol/week: 1.0 standard drinks    Types: 1 Standard drinks or equivalent per week  . Drug use: No  . Sexual activity: Yes    Birth control/protection: Pill  Lifestyle  .  Physical activity:    Days per week: Not on file    Minutes per session: Not on file  . Stress: Not on file  Relationships  . Social connections:    Talks on phone: Not on file    Gets together: Not on file    Attends religious service: Not on file    Active member of club or organization: Not on file    Attends meetings of clubs or organizations: Not on file    Relationship status: Not on file  . Intimate partner violence:    Fear of current or ex partner: Not on file    Emotionally abused: Not on file    Physically abused: Not on file    Forced sexual activity: Not on file  Other Topics Concern  . Not on file  Social History Narrative  . Not on file    Relevant past medical, surgical, family and social history reviewed and updated as indicated. Interim medical history since our last visit reviewed. Allergies and medications  reviewed and updated.  Review of Systems  Per HPI unless specifically indicated above     Objective:    BP 133/90 (BP Location: Right Arm, Cuff Size: Normal)   Pulse 72   Temp 98.1 F (36.7 C) (Oral)   Wt 200 lb 12.8 oz (91.1 kg)   SpO2 97%   BMI 34.47 kg/m   Wt Readings from Last 3 Encounters:  01/12/19 200 lb 12.8 oz (91.1 kg)  10/30/18 193 lb 9.6 oz (87.8 kg)  06/30/18 181 lb 6.4 oz (82.3 kg)    Physical Exam Vitals signs and nursing note reviewed.  Constitutional:      Appearance: Normal appearance. She is not ill-appearing.  HENT:     Head: Atraumatic.  Eyes:     Extraocular Movements: Extraocular movements intact.     Conjunctiva/sclera: Conjunctivae normal.  Neck:     Musculoskeletal: Normal range of motion and neck supple.  Cardiovascular:     Rate and Rhythm: Normal rate and regular rhythm.     Heart sounds: Normal heart sounds.  Pulmonary:     Effort: Pulmonary effort is normal.     Breath sounds: Normal breath sounds.  Abdominal:     General: Bowel sounds are normal.     Palpations: Abdomen is soft.     Tenderness: There is no abdominal tenderness.  Musculoskeletal: Normal range of motion.  Skin:    General: Skin is warm and dry.  Neurological:     Mental Status: She is alert and oriented to person, place, and time.  Psychiatric:        Thought Content: Thought content normal.        Judgment: Judgment normal.     Comments: Tearful, anxious appearing     Results for orders placed or performed in visit on 10/30/18  WET PREP FOR [place], YEAST, CLUE  Result Value Ref Range   Trichomonas Exam Negative Negative   Yeast Exam Negative Negative   Clue Cell Exam Positive (A) Negative      Assessment & Plan:   Problem List Items Addressed This Visit      Digestive   Ulcerative colitis ([place])    Will send apriso per insurance request due to coverage issues with delzicol        Other   Insomnia    Increase trazodone to 100 mg QHS prn.        Depression - Primary    Under very poor  control, switch to effexor and add latuda for mood stabilization since seroquel is too sedating. Restart counseling - packet given today of local counselors.       Relevant Medications   venlafaxine XR (EFFEXOR XR) 75 MG 24 hr capsule       Follow up plan: Return in about 4 weeks (around 02/09/2019) for Mood f/u.

## 2019-01-13 NOTE — Assessment & Plan Note (Signed)
Will send apriso per insurance request due to coverage issues with delzicol

## 2019-01-13 NOTE — Assessment & Plan Note (Signed)
Under very poor control, switch to effexor and add latuda for mood stabilization since seroquel is too sedating. Restart counseling - packet given today of local counselors.

## 2019-01-13 NOTE — Assessment & Plan Note (Signed)
Increase trazodone to 100 mg QHS prn.

## 2019-01-29 DIAGNOSIS — M9901 Segmental and somatic dysfunction of cervical region: Secondary | ICD-10-CM | POA: Diagnosis not present

## 2019-01-29 DIAGNOSIS — M5412 Radiculopathy, cervical region: Secondary | ICD-10-CM | POA: Diagnosis not present

## 2019-01-29 DIAGNOSIS — M5033 Other cervical disc degeneration, cervicothoracic region: Secondary | ICD-10-CM | POA: Diagnosis not present

## 2019-01-30 ENCOUNTER — Telehealth: Payer: Self-pay | Admitting: Obstetrics and Gynecology

## 2019-01-30 ENCOUNTER — Other Ambulatory Visit: Payer: Self-pay | Admitting: *Deleted

## 2019-01-30 DIAGNOSIS — M5412 Radiculopathy, cervical region: Secondary | ICD-10-CM | POA: Diagnosis not present

## 2019-01-30 DIAGNOSIS — M5033 Other cervical disc degeneration, cervicothoracic region: Secondary | ICD-10-CM | POA: Diagnosis not present

## 2019-01-30 DIAGNOSIS — M9901 Segmental and somatic dysfunction of cervical region: Secondary | ICD-10-CM | POA: Diagnosis not present

## 2019-01-30 NOTE — Telephone Encounter (Signed)
Patient called requesting a refill on adderral. She would have sent a message in [place] but she is locked out of her account.-Thanks

## 2019-02-02 DIAGNOSIS — M5412 Radiculopathy, cervical region: Secondary | ICD-10-CM | POA: Diagnosis not present

## 2019-02-02 DIAGNOSIS — M9901 Segmental and somatic dysfunction of cervical region: Secondary | ICD-10-CM | POA: Diagnosis not present

## 2019-02-02 DIAGNOSIS — M5033 Other cervical disc degeneration, cervicothoracic region: Secondary | ICD-10-CM | POA: Diagnosis not present

## 2019-02-02 NOTE — Telephone Encounter (Signed)
Pt is to early for refill

## 2019-02-05 DIAGNOSIS — M9901 Segmental and somatic dysfunction of cervical region: Secondary | ICD-10-CM | POA: Diagnosis not present

## 2019-02-05 DIAGNOSIS — M5412 Radiculopathy, cervical region: Secondary | ICD-10-CM | POA: Diagnosis not present

## 2019-02-05 DIAGNOSIS — M5033 Other cervical disc degeneration, cervicothoracic region: Secondary | ICD-10-CM | POA: Diagnosis not present

## 2019-02-09 ENCOUNTER — Ambulatory Visit: Payer: 59 | Admitting: Family Medicine

## 2019-02-09 ENCOUNTER — Other Ambulatory Visit: Payer: Self-pay

## 2019-02-09 ENCOUNTER — Encounter: Payer: Self-pay | Admitting: Family Medicine

## 2019-02-09 ENCOUNTER — Other Ambulatory Visit: Payer: Self-pay | Admitting: Family Medicine

## 2019-02-09 VITALS — BP 113/79 | HR 75 | Temp 98.5°F

## 2019-02-09 DIAGNOSIS — F332 Major depressive disorder, recurrent severe without psychotic features: Secondary | ICD-10-CM

## 2019-02-09 DIAGNOSIS — N760 Acute vaginitis: Secondary | ICD-10-CM | POA: Diagnosis not present

## 2019-02-09 DIAGNOSIS — G4700 Insomnia, unspecified: Secondary | ICD-10-CM | POA: Diagnosis not present

## 2019-02-09 DIAGNOSIS — B9689 Other specified bacterial agents as the cause of diseases classified elsewhere: Secondary | ICD-10-CM | POA: Diagnosis not present

## 2019-02-09 MED ORDER — METRONIDAZOLE 0.75 % VA GEL: 1.0000 | Freq: Two times a day (BID) | VAGINAL | 0 refills | Status: DC

## 2019-02-09 MED ORDER — SECNIDAZOLE 2 G PO PACK: 1.0000 [IU] | PACK | Freq: Once | ORAL | 0 refills | Status: AC

## 2019-02-09 MED ORDER — LURASIDONE HCL 20 MG PO TABS: 20.0000 mg | ORAL_TABLET | Freq: Every day | ORAL | 0 refills | Status: DC

## 2019-02-09 MED ORDER — VENLAFAXINE HCL ER 75 MG PO CP24: 75.0000 mg | ORAL_CAPSULE | Freq: Every day | ORAL | 0 refills | Status: DC

## 2019-02-09 MED ORDER — SUVOREXANT 10 MG PO TABS: 10.0000 mg | ORAL_TABLET | Freq: Every evening | ORAL | 0 refills | Status: DC | PRN

## 2019-02-09 NOTE — Progress Notes (Signed)
BP 113/79   Pulse 75   Temp 98.5 F (36.9 C) (Oral)   SpO2 98%    Subjective:    Patient ID: Tracy Whitaker, female    DOB: 1981/06/06, 38 y.o.   MRN: [id number]  HPI: Tracy Whitaker is a 38 y.o. female  Chief Complaint  Patient presents with  . Depression    4 week f/up    Here today for 1 month mood f/u. Tolerating the effexor and latuda well so far. Having nightmares and night sweats she thinks is from the trazodone, stopped it and restarted the seroquel back the past few weeks. Those side effects resolved but having daytime somnolence now. Denies SI/HI, moods more balanced, less crying spells.   Completed part of the flagyl tablets for her recurrent BV but not full course. Still having some sxs. Recently started using summer's eve feminine wash to try and help with the discharge and odor with no relief. No dysuria, fevers, abdominal pain, N/V/D.   Depression screen [facility] 2/9 02/09/2019 01/12/2019 05/12/2018  Decreased Interest 3 3 0  Down, Depressed, Hopeless 3 3 0  PHQ - 2 Score 6 6 0  Altered sleeping 3 3 -  Tired, decreased energy 3 3 -  Change in appetite 3 3 -  Feeling bad or failure about yourself  3 3 -  Trouble concentrating 3 3 -  Moving slowly or fidgety/restless 3 3 -  Suicidal thoughts 0 0 -  PHQ-9 Score 24 24 -    Relevant past medical, surgical, family and social history reviewed and updated as indicated. Interim medical history since our last visit reviewed. Allergies and medications reviewed and updated.  Review of Systems  Per HPI unless specifically indicated above     Objective:    BP 113/79   Pulse 75   Temp 98.5 F (36.9 C) (Oral)   SpO2 98%   Wt Readings from Last 3 Encounters:  01/12/19 200 lb 12.8 oz (91.1 kg)  10/30/18 193 lb 9.6 oz (87.8 kg)  06/30/18 181 lb 6.4 oz (82.3 kg)    Physical Exam Vitals signs and nursing note reviewed.  Constitutional:      Appearance: Normal appearance. She is not ill-appearing.  HENT:   Head: Atraumatic.  Eyes:     Extraocular Movements: Extraocular movements intact.     Conjunctiva/sclera: Conjunctivae normal.  Neck:     Musculoskeletal: Normal range of motion and neck supple.  Cardiovascular:     Rate and Rhythm: Normal rate and regular rhythm.     Heart sounds: Normal heart sounds.  Pulmonary:     Effort: Pulmonary effort is normal.     Breath sounds: Normal breath sounds.  Abdominal:     General: Bowel sounds are normal.     Palpations: Abdomen is soft.     Tenderness: There is no abdominal tenderness. There is no guarding.  Musculoskeletal: Normal range of motion.  Skin:    General: Skin is warm and dry.  Neurological:     Mental Status: She is alert and oriented to person, place, and time.  Psychiatric:        Mood and Affect: Mood normal.        Thought Content: Thought content normal.        Judgment: Judgment normal.     Results for orders placed or performed in visit on 10/30/18  WET PREP FOR [place], YEAST, CLUE  Result Value Ref Range   Trichomonas Exam Negative Negative  Yeast Exam Negative Negative   Clue Cell Exam Positive (A) Negative      Assessment & Plan:   Problem List Items Addressed This Visit      Other   Insomnia    Side effects with trazodone, d/c. Discussed cannot take seroquel and latuda at same time. Stop the seroqual and trial belsomra for sleep as her issue is more staying asleep. F/u in 1 month. Sleep hygiene reviewed      Depression - Primary    Some good improvement with latuda and effexor, continue current regimen. Counseling offered, pt will consider. F/u if worsening or not improving      Relevant Medications   venlafaxine XR (EFFEXOR XR) 75 MG 24 hr capsule    Other Visit Diagnoses    BV (bacterial vaginosis)       Recurrent. Completed most of her most recent flagyl course without long term improvement and cannot afford the vaginal gel. Try solosec, d/c feminine washes       Follow up plan: Return in  about 4 weeks (around 03/09/2019) for Mood f/u.

## 2019-02-12 ENCOUNTER — Telehealth: Payer: Self-pay | Admitting: Obstetrics and Gynecology

## 2019-02-12 ENCOUNTER — Telehealth: Payer: Self-pay

## 2019-02-12 ENCOUNTER — Other Ambulatory Visit: Payer: Self-pay | Admitting: *Deleted

## 2019-02-12 ENCOUNTER — Encounter: Payer: Self-pay | Admitting: *Deleted

## 2019-02-12 MED ORDER — ZOLPIDEM TARTRATE 5 MG PO TABS: 5.0000 mg | ORAL_TABLET | Freq: Every evening | ORAL | 0 refills | Status: DC | PRN

## 2019-02-12 MED ORDER — AMPHETAMINE-DEXTROAMPHETAMINE 20 MG PO TABS: 20.0000 mg | ORAL_TABLET | Freq: Two times a day (BID) | ORAL | 0 refills | Status: DC

## 2019-02-12 NOTE — Telephone Encounter (Signed)
The patient called and stated that she needs a refill of her prescription amphetamine-dextroamphetamine (ADDERALL) 20 MG tablet. Please advise.

## 2019-02-12 NOTE — Telephone Encounter (Signed)
Sent pt my chart.

## 2019-02-12 NOTE — Telephone Encounter (Signed)
Message relayed to patient. Verbalized understanding and denied questions.   

## 2019-02-12 NOTE — Assessment & Plan Note (Signed)
Side effects with trazodone, d/c. Discussed cannot take seroquel and latuda at same time. Stop the seroqual and trial belsomra for sleep as her issue is more staying asleep. F/u in 1 month. Sleep hygiene reviewed

## 2019-02-12 NOTE — Telephone Encounter (Signed)
Sent in small supply of ambien in case belsomra not covered. Do not pick up both if it is covered.

## 2019-02-12 NOTE — Telephone Encounter (Signed)
PA submitted via cover my meds for Solosec aand Belsomra. Awaiting approval or denial.   Apolonio Schneiders patient would like to try one of the other suggested medications for sleep, she states that she has had a bad few nights and really needs something to help her sleep. Recommended per insurance [place], and [place]

## 2019-02-12 NOTE — Assessment & Plan Note (Signed)
Some good improvement with latuda and effexor, continue current regimen. Counseling offered, pt will consider. F/u if worsening or not improving

## 2019-02-13 ENCOUNTER — Encounter: Payer: Self-pay | Admitting: *Deleted

## 2019-02-13 ENCOUNTER — Encounter: Payer: Self-pay | Admitting: Family Medicine

## 2019-02-14 ENCOUNTER — Other Ambulatory Visit: Payer: Self-pay | Admitting: Obstetrics and Gynecology

## 2019-02-14 NOTE — Telephone Encounter (Signed)
Solosec 36m has been approved.

## 2019-02-21 ENCOUNTER — Encounter: Payer: Self-pay | Admitting: Family Medicine

## 2019-02-27 ENCOUNTER — Other Ambulatory Visit: Payer: Self-pay | Admitting: Family Medicine

## 2019-02-27 ENCOUNTER — Encounter: Payer: Self-pay | Admitting: Family Medicine

## 2019-02-27 MED ORDER — ZOLPIDEM TARTRATE 5 MG PO TABS: 5.0000 mg | ORAL_TABLET | Freq: Every evening | ORAL | 0 refills | Status: DC | PRN

## 2019-02-27 MED ORDER — SUVOREXANT 10 MG PO TABS: 10.0000 mg | ORAL_TABLET | Freq: Every evening | ORAL | 0 refills | Status: DC | PRN

## 2019-02-28 ENCOUNTER — Telehealth: Payer: Self-pay

## 2019-02-28 NOTE — Telephone Encounter (Signed)
PA submitted for Belsomra via cover my meds. Awaiting approval or denial.

## 2019-03-13 ENCOUNTER — Other Ambulatory Visit: Payer: Self-pay | Admitting: Family Medicine

## 2019-03-13 NOTE — Telephone Encounter (Signed)
.   Requested Prescriptions  Pending Prescriptions Disp Refills  . venlafaxine XR (EFFEXOR-XR) 75 MG 24 hr capsule [Pharmacy Med Name: VENLAFAXINE ER 75MG  CAPSULES] 90 capsule 1    Sig: TAKE 1 CAPSULE(75 MG) BY MOUTH DAILY WITH BREAKFAST     Psychiatry: Antidepressants - SNRI - desvenlafaxine & venlafaxine Failed - 03/13/2019 11:29 AM      Failed - LDL in normal range and within 360 days    LDL Calculated  Date Value Ref Range Status  06/30/2018 123 (H) 0 - 99 mg/dL Final         Failed - Total Cholesterol in normal range and within 360 days    Cholesterol, Total  Date Value Ref Range Status  06/30/2018 208 (H) 100 - 199 mg/dL Final         Failed - Triglycerides in normal range and within 360 days    Triglycerides  Date Value Ref Range Status  06/30/2018 159 (H) 0 - 149 mg/dL Final         Passed - Completed PHQ-2 or PHQ-9 in the last 360 days.      Passed - Last BP in normal range    BP Readings from Last 1 Encounters:  02/09/19 113/79         Passed - Valid encounter within last 6 months    Recent Outpatient Visits          1 month ago Severe episode of recurrent major depressive disorder, without psychotic features [facility])   [facility] Volney American, PA-C   2 months ago Severe episode of recurrent major depressive disorder, without psychotic features [facility])   [place], [place], [place]   4 months ago BV (bacterial vaginosis)   [facility], [place], [place]   8 months ago Acute maxillary sinusitis, recurrence not specified   [place], [place], [place]   10 months ago Perianal abscess   [place], [place]

## 2019-03-20 ENCOUNTER — Encounter: Payer: Self-pay | Admitting: Family Medicine

## 2019-03-21 ENCOUNTER — Telehealth: Payer: Self-pay | Admitting: Family Medicine

## 2019-03-21 NOTE — Telephone Encounter (Signed)
Please get her scheduled for OV

## 2019-03-21 NOTE — Telephone Encounter (Signed)
LVM for pt to call back.

## 2019-04-12 ENCOUNTER — Telehealth: Payer: Self-pay | Admitting: Family Medicine

## 2019-04-12 NOTE — Telephone Encounter (Signed)
Called pt to go over Covid-19 screening, no answer, left voicemail.

## 2019-04-13 ENCOUNTER — Encounter: Payer: Self-pay | Admitting: Family Medicine

## 2019-04-13 ENCOUNTER — Other Ambulatory Visit: Payer: Self-pay

## 2019-04-13 ENCOUNTER — Ambulatory Visit: Payer: 59 | Admitting: Family Medicine

## 2019-04-13 VITALS — BP 104/72 | HR 98 | Temp 98.6°F | Ht 64.0 in | Wt 197.0 lb

## 2019-04-13 DIAGNOSIS — G4700 Insomnia, unspecified: Secondary | ICD-10-CM

## 2019-04-13 DIAGNOSIS — L729 Follicular cyst of the skin and subcutaneous tissue, unspecified: Secondary | ICD-10-CM

## 2019-04-13 DIAGNOSIS — F332 Major depressive disorder, recurrent severe without psychotic features: Secondary | ICD-10-CM | POA: Diagnosis not present

## 2019-04-13 DIAGNOSIS — F17210 Nicotine dependence, cigarettes, uncomplicated: Secondary | ICD-10-CM

## 2019-04-13 MED ORDER — VARENICLINE TARTRATE 0.5 MG X 11 & 1 MG X 42 PO MISC: ORAL | 0 refills | Status: DC

## 2019-04-13 MED ORDER — QUETIAPINE FUMARATE 100 MG PO TABS: 100.0000 mg | ORAL_TABLET | Freq: Every day | ORAL | 2 refills | Status: DC

## 2019-04-13 NOTE — Progress Notes (Signed)
BP 104/72   Pulse 98   Temp 98.6 F (37 C) (Oral)   Ht 5\' 4"  (1.626 m)   Wt 197 lb (89.4 kg)   SpO2 97%   BMI 33.81 kg/m    Subjective:    Patient ID: Tracy Whitaker, female    DOB: 1981-04-23, 38 y.o.   MRN: [id number]  HPI: [name] is a 38 y.o. female  Chief Complaint  Patient presents with  . Anxiety    x about few months ago/ pt would like to discuss about chantix  . Insomnia  . Ear Pain    behind the left ear.   Here today for sleep and anxiety f/u. Belsomra not working, didn't do well on ambien or trazodone either. Only the seroquel worked for sleep. Sleeping about 3 hours a night. Still crying constantly, not wanting to go do anything. Was previously on 100-200 mg seroquel at bedtime which helped some but seemed too heavy and kept her feeling groggy. Effexor helping a little as far as moods and anxiety control but still feeling very overwhelmed and having crying spells. Denies SI/HI, severe mood swings.   Depression screen [facility] 2/9 04/13/2019 02/09/2019 01/12/2019  Decreased Interest 3 3 3   Down, Depressed, Hopeless 3 3 3   PHQ - 2 Score 6 6 6   Altered sleeping 3 3 3   Tired, decreased energy 3 3 3   Change in appetite 3 3 3   Feeling bad or failure about yourself  3 3 3   Trouble concentrating 3 3 3   Moving slowly or fidgety/restless 3 3 3   Suicidal thoughts 3 0 0  PHQ-9 Score 27 24 24    GAD 7 : Generalized Anxiety Score 04/13/2019 01/12/2019  Nervous, Anxious, on Edge 3 3  Control/stop worrying 3 3  Worry too much - different things 3 3  Trouble relaxing 3 3  Restless 2 3  Easily annoyed or irritable 3 3  Afraid - awful might happen 3 3  Total GAD 7 Score 20 21  Anxiety Difficulty - Extremely difficult     Wanting to try chantix again. Had success in the past but stopped a month early and now is back to a ppd smoking. 1/2 ppd during work days. Tolerated well in the past.   Still dealing with a cyst behind her left ear that keeps flaring and draining  and being painful. Compresses, ointments and pressure not helping.   Relevant past medical, surgical, family and social history reviewed and updated as indicated. Interim medical history since our last visit reviewed. Allergies and medications reviewed and updated.  Review of Systems  Per HPI unless specifically indicated above     Objective:    BP 104/72   Pulse 98   Temp 98.6 F (37 C) (Oral)   Ht 5\' 4"  (1.626 m)   Wt 197 lb (89.4 kg)   SpO2 97%   BMI 33.81 kg/m   Wt Readings from Last 3 Encounters:  04/13/19 197 lb (89.4 kg)  01/12/19 200 lb 12.8 oz (91.1 kg)  10/30/18 193 lb 9.6 oz (87.8 kg)    Physical Exam Vitals signs and nursing note reviewed.  Constitutional:      Appearance: Normal appearance. She is not ill-appearing.  HENT:     Head: Atraumatic.  Eyes:     Extraocular Movements: Extraocular movements intact.     Conjunctiva/sclera: Conjunctivae normal.  Neck:     Musculoskeletal: Normal range of motion and neck supple.  Cardiovascular:  Rate and Rhythm: Normal rate and regular rhythm.     Heart sounds: Normal heart sounds.  Pulmonary:     Effort: Pulmonary effort is normal.     Breath sounds: Normal breath sounds.  Musculoskeletal: Normal range of motion.  Skin:    General: Skin is warm and dry.     Comments: Cutaneous cyst behind left ear, area ttp  Neurological:     Mental Status: She is alert and oriented to person, place, and time.  Psychiatric:        Mood and Affect: Mood normal.        Thought Content: Thought content normal.        Judgment: Judgment normal.     Results for orders placed or performed in visit on 10/30/18  WET PREP FOR [place], YEAST, CLUE  Result Value Ref Range   Trichomonas Exam Negative Negative   Yeast Exam Negative Negative   Clue Cell Exam Positive (A) Negative      Assessment & Plan:   Problem List Items Addressed This Visit      Other   Insomnia    Will d/c latuda and restart seroquel at 100 mg for  sleep and mood control. May cut in half if too sedating. Sleep hygiene reviewed      Relevant Orders   Ambulatory referral to Psychiatry   Depression - Primary    Still not under excellent control despite trying multiple medications now. Will refer to Psychiatry for further management. Counseling recommended additionally      Relevant Orders   Ambulatory referral to Psychiatry   Cigarette smoker    Restart chantix, has done well in the past.        Other Visit Diagnoses    Cutaneous cyst       Referral to Dermatology placed to discuss removal given recurrence of infection and issues. Continue compresses and neosporin prn   Relevant Orders   Ambulatory referral to Dermatology       Follow up plan: Return in about 4 weeks (around 05/11/2019) for anxiety.

## 2019-04-18 ENCOUNTER — Telehealth: Payer: Self-pay | Admitting: Obstetrics and Gynecology

## 2019-04-18 NOTE — Telephone Encounter (Signed)
pls advise

## 2019-04-18 NOTE — Telephone Encounter (Signed)
The patient called and stated that she needs a medication refill of her prescription amphetamine-dextroamphetamine (ADDERALL) 20 MG tablet [9082]. Please advise.

## 2019-04-19 ENCOUNTER — Encounter: Payer: Self-pay | Admitting: Family Medicine

## 2019-04-19 DIAGNOSIS — Z87891 Personal history of nicotine dependence: Secondary | ICD-10-CM | POA: Insufficient documentation

## 2019-04-19 DIAGNOSIS — F17210 Nicotine dependence, cigarettes, uncomplicated: Secondary | ICD-10-CM | POA: Insufficient documentation

## 2019-04-19 NOTE — Assessment & Plan Note (Signed)
Will d/c latuda and restart seroquel at 100 mg for sleep and mood control. May cut in half if too sedating. Sleep hygiene reviewed

## 2019-04-19 NOTE — Assessment & Plan Note (Signed)
Restart chantix, has done well in the past.

## 2019-04-19 NOTE — Assessment & Plan Note (Signed)
Still not under excellent control despite trying multiple medications now. Will refer to Psychiatry for further management. Counseling recommended additionally

## 2019-04-20 ENCOUNTER — Encounter: Payer: Self-pay | Admitting: Licensed Clinical Social Worker

## 2019-04-20 ENCOUNTER — Other Ambulatory Visit: Payer: Self-pay | Admitting: Obstetrics and Gynecology

## 2019-04-20 ENCOUNTER — Ambulatory Visit (INDEPENDENT_AMBULATORY_CARE_PROVIDER_SITE_OTHER): Payer: 59 | Admitting: Licensed Clinical Social Worker

## 2019-04-20 ENCOUNTER — Other Ambulatory Visit: Payer: Self-pay

## 2019-04-20 ENCOUNTER — Emergency Department
Admission: EM | Admit: 2019-04-20 | Discharge: 2019-04-20 | Disposition: A | Discharge status: Home / Self Care | Payer: 59 | Attending: Emergency Medicine | Admitting: Emergency Medicine

## 2019-04-20 ENCOUNTER — Encounter: Payer: Self-pay | Admitting: Emergency Medicine

## 2019-04-20 ENCOUNTER — Encounter: Payer: Self-pay | Admitting: *Deleted

## 2019-04-20 ENCOUNTER — Ambulatory Visit (HOSPITAL_COMMUNITY)
Admission: EM | Admit: 2019-04-20 | Discharge: 2019-04-20 | Disposition: A | Discharge status: Home / Self Care | Payer: No Typology Code available for payment source | Attending: Emergency Medicine | Admitting: Emergency Medicine

## 2019-04-20 DIAGNOSIS — Z0441 Encounter for examination and observation following alleged adult rape: Secondary | ICD-10-CM | POA: Diagnosis not present

## 2019-04-20 DIAGNOSIS — F332 Major depressive disorder, recurrent severe without psychotic features: Secondary | ICD-10-CM

## 2019-04-20 LAB — URINALYSIS, COMPLETE (UACMP) WITH MICROSCOPIC
Bilirubin Urine: NEGATIVE
Glucose, UA: NEGATIVE mg/dL
Ketones, ur: NEGATIVE mg/dL
Nitrite: NEGATIVE
Protein, ur: NEGATIVE mg/dL
Specific Gravity, Urine: 1.01 (ref 1.005–1.030)
pH: 6 (ref 5.0–8.0)

## 2019-04-20 LAB — POCT PREGNANCY, URINE: Preg Test, Ur: NEGATIVE

## 2019-04-20 MED ORDER — AMPHETAMINE-DEXTROAMPHETAMINE 20 MG PO TABS: 20.0000 mg | ORAL_TABLET | Freq: Two times a day (BID) | ORAL | 0 refills | Status: DC

## 2019-04-20 MED ORDER — CEFTRIAXONE SODIUM 250 MG IJ SOLR
250.0000 mg | Freq: Once | INTRAMUSCULAR | Status: AC
  Administered 2019-04-20: 250 mg via INTRAMUSCULAR
  Filled 2019-04-20: qty 250

## 2019-04-20 MED ORDER — HEPATITIS B VAC RECOMBINANT 10 MCG/ML IJ SUSP
1.0000 mL | Freq: Once | INTRAMUSCULAR | Status: DC
  Filled 2019-04-20: qty 1

## 2019-04-20 MED ORDER — ULIPRISTAL ACETATE 30 MG PO TABS
30.0000 mg | ORAL_TABLET | Freq: Once | ORAL | Status: AC
  Administered 2019-04-20: 30 mg via ORAL
  Filled 2019-04-20: qty 1

## 2019-04-20 MED ORDER — METRONIDAZOLE 500 MG PO TABS
2000.0000 mg | ORAL_TABLET | Freq: Once | ORAL | Status: AC
  Administered 2019-04-20: 22:00:00 2000 mg via ORAL
  Filled 2019-04-20: qty 4

## 2019-04-20 MED ORDER — LIDOCAINE HCL (PF) 1 % IJ SOLN
0.9000 mL | Freq: Once | INTRAMUSCULAR | Status: AC
  Administered 2019-04-20: 0.9 mL
  Filled 2019-04-20: qty 5

## 2019-04-20 MED ORDER — AZITHROMYCIN 500 MG PO TABS
1000.0000 mg | ORAL_TABLET | Freq: Once | ORAL | Status: AC
  Administered 2019-04-20: 1000 mg via ORAL
  Filled 2019-04-20: qty 2

## 2019-04-20 MED ORDER — TETANUS-DIPHTH-ACELL PERTUSSIS 5-2.5-18.5 LF-MCG/0.5 IM SUSP: 0.5000 mL | Freq: Once | INTRAMUSCULAR | Status: DC

## 2019-04-20 NOTE — ED Notes (Signed)
SANE RN here to see pt. Pt in room with BPD.

## 2019-04-20 NOTE — ED Notes (Signed)
Discharge paperwork reviewed with pt who is visibly upset.
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
## 2019-04-20 NOTE — Telephone Encounter (Signed)
Done through till august, at which time her annual is due.
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
## 2019-04-20 NOTE — ED Notes (Signed)
Pt placed in room 53 with chaplin    She is waiting SANE nurse   Pt I very tearful at present
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
## 2019-04-20 NOTE — ED Notes (Signed)
[place] PD officer [place] in with pt

## 2019-04-20 NOTE — ED Notes (Signed)
SANE  NURSE  CALLED  PER  DR  Cinda Quest  MD

## 2019-04-20 NOTE — SANE Note (Addendum)
ON 04/20/2019, AT APPROXIMATELY 1504 HOURS, I RECEIVED A PAGE TO CONTACT THE [facility] ED IN REFERENCE TO EVALUATING THIS PT.  AT THE TIME OF THE PAGE, I WAS WITH A PT AT [facility].  AFTER SPEAKING WITH [facility] ED STAFF, I ADVISED THEM THAT THERE WAS A PT AT [facility] THAT I HAD BEEN CALLED TO SEE BEFORE THE PT AT [facility] (AND WHILE I WAS STILL WITH THE PT AT [facility]).   I FURTHER ADVISED ED STAFF THAT I WOULD SEND OUT A TEXT TO THE FORENSIC NURSING/SANE DEPARTMENT TO SEE IF ANYONE COULD ASSIST WITH SEEING THIS PT SOONER.    OTHERWISE, IT MAY BE SEVERAL HOURS BEFORE THIS PT IS SEEN BY THE FORENSIC NURSE EXAMINER (FNE)/SANE RN.
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
## 2019-04-20 NOTE — Telephone Encounter (Signed)
Sent [facility]
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
## 2019-04-23 ENCOUNTER — Telehealth: Payer: Self-pay

## 2019-04-23 NOTE — Telephone Encounter (Signed)
Prior authorization for Ch[employer] was initiated via [email or web address]. Key: [id number]

## 2019-04-23 NOTE — Telephone Encounter (Signed)
P.A. is approved from 04/22/2020.
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
## 2019-05-04 ENCOUNTER — Encounter

## 2019-05-04 ENCOUNTER — Other Ambulatory Visit: Payer: Self-pay

## 2019-05-04 ENCOUNTER — Ambulatory Visit (INDEPENDENT_AMBULATORY_CARE_PROVIDER_SITE_OTHER): Payer: 59 | Admitting: Psychiatry

## 2019-05-04 ENCOUNTER — Encounter: Payer: Self-pay | Admitting: Psychiatry

## 2019-05-04 DIAGNOSIS — F17200 Nicotine dependence, unspecified, uncomplicated: Secondary | ICD-10-CM

## 2019-05-04 DIAGNOSIS — F3132 Bipolar disorder, current episode depressed, moderate: Secondary | ICD-10-CM

## 2019-05-04 DIAGNOSIS — F900 Attention-deficit hyperactivity disorder, predominantly inattentive type: Secondary | ICD-10-CM

## 2019-05-04 DIAGNOSIS — F430 Acute stress reaction: Secondary | ICD-10-CM

## 2019-05-04 DIAGNOSIS — F419 Anxiety disorder, unspecified: Secondary | ICD-10-CM | POA: Insufficient documentation

## 2019-05-04 MED ORDER — VENLAFAXINE HCL ER 150 MG PO CP24: 150.0000 mg | ORAL_CAPSULE | Freq: Every day | ORAL | 0 refills | Status: DC

## 2019-05-04 MED ORDER — QUETIAPINE FUMARATE 25 MG PO TABS: 25.0000 mg | ORAL_TABLET | Freq: Every day | ORAL | 0 refills | Status: DC

## 2019-05-04 MED ORDER — HYDROXYZINE PAMOATE 25 MG PO CAPS: 25.0000 mg | ORAL_CAPSULE | Freq: Three times a day (TID) | ORAL | 0 refills | Status: DC | PRN
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
## 2019-05-08 ENCOUNTER — Ambulatory Visit (INDEPENDENT_AMBULATORY_CARE_PROVIDER_SITE_OTHER): Payer: 59 | Admitting: Licensed Clinical Social Worker

## 2019-05-08 ENCOUNTER — Other Ambulatory Visit: Payer: Self-pay

## 2019-05-08 ENCOUNTER — Encounter: Payer: Self-pay | Admitting: Licensed Clinical Social Worker

## 2019-05-08 DIAGNOSIS — F430 Acute stress reaction: Secondary | ICD-10-CM

## 2019-05-08 DIAGNOSIS — F3132 Bipolar disorder, current episode depressed, moderate: Secondary | ICD-10-CM | POA: Diagnosis not present
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
## 2019-05-21 NOTE — SANE Note (Signed)
Follow-up Phone Call  Patient gives verbal consent for a FNE/SANE follow-up phone call in 48-72 hours: No Patient's telephone number: Does not want to be called Patient gives verbal consent to leave voicemail at the phone number listed above: No DO NOT CALL between the hours of: No call please
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
## 2019-05-22 ENCOUNTER — Ambulatory Visit: Payer: 59 | Admitting: Psychiatry

## 2019-06-01 ENCOUNTER — Ambulatory Visit: Payer: 59 | Admitting: Licensed Clinical Social Worker

## 2019-07-04 ENCOUNTER — Other Ambulatory Visit: Payer: Self-pay | Admitting: Family Medicine

## 2019-07-04 NOTE — Telephone Encounter (Signed)
Forwarding medication refill to PCP for review. 

## 2019-07-11 ENCOUNTER — Telehealth: Payer: Self-pay | Admitting: Obstetrics and Gynecology

## 2019-07-11 NOTE — Telephone Encounter (Signed)
The patient called and stated that she needs to speak with a nurse or provider in regards to her needing a prescription refill before her scheduled annual. Please advise.

## 2019-07-13 NOTE — Telephone Encounter (Signed)
Pt needs refill on adderall  [facility]

## 2019-07-13 NOTE — Telephone Encounter (Signed)
The patient called and stated that she missed a call from Amy. Pt stated she is at work and it is fine to lvm. Please advise.

## 2019-07-17 ENCOUNTER — Other Ambulatory Visit: Payer: Self-pay | Admitting: Obstetrics and Gynecology

## 2019-07-17 MED ORDER — AMPHETAMINE-DEXTROAMPHETAMINE 20 MG PO TABS: 20.0000 mg | ORAL_TABLET | Freq: Two times a day (BID) | ORAL | 0 refills | Status: DC

## 2019-08-10 ENCOUNTER — Other Ambulatory Visit: Payer: Self-pay | Admitting: Family Medicine

## 2019-08-10 NOTE — Telephone Encounter (Signed)
Forwarding medication refill request to PCP for review. 

## 2019-08-15 ENCOUNTER — Other Ambulatory Visit: Payer: Self-pay | Admitting: Family Medicine

## 2019-08-15 DIAGNOSIS — F3132 Bipolar disorder, current episode depressed, moderate: Secondary | ICD-10-CM

## 2019-08-15 DIAGNOSIS — F430 Acute stress reaction: Secondary | ICD-10-CM

## 2019-08-15 MED ORDER — QUETIAPINE FUMARATE 25 MG PO TABS: 25.0000 mg | ORAL_TABLET | Freq: Every day | ORAL | 0 refills | Status: DC

## 2019-08-15 MED ORDER — QUETIAPINE FUMARATE 100 MG PO TABS: 100.0000 mg | ORAL_TABLET | Freq: Every day | ORAL | 0 refills | Status: DC

## 2019-08-21 ENCOUNTER — Other Ambulatory Visit: Payer: Self-pay

## 2019-08-21 DIAGNOSIS — F430 Acute stress reaction: Secondary | ICD-10-CM

## 2019-08-21 MED ORDER — VENLAFAXINE HCL ER 150 MG PO CP24: 150.0000 mg | ORAL_CAPSULE | Freq: Every day | ORAL | 0 refills | Status: DC

## 2019-08-21 NOTE — Telephone Encounter (Signed)
Appointment 10/12/2019

## 2019-08-21 NOTE — Telephone Encounter (Signed)
So we do not need to contact the patient?

## 2019-08-21 NOTE — Telephone Encounter (Signed)
The appt on that date is with GYN, not our office. I had notified pt via mychart message recently regarding her seroquel refill that she was overdue for a follow up with [place] as she is no longer seeing Psychiatry. She has not yet scheduled this follow up but will need to do so for any further refills

## 2019-08-21 NOTE — Telephone Encounter (Signed)
Called and left a detailed message for patient, asking her to call back to schedule a follow up appt.

## 2019-08-22 ENCOUNTER — Encounter: Payer: Self-pay | Admitting: Family Medicine

## 2019-08-22 NOTE — Telephone Encounter (Signed)
Sent message through mychart

## 2019-08-24 ENCOUNTER — Other Ambulatory Visit: Payer: Self-pay

## 2019-08-27 MED ORDER — MESALAMINE ER 0.375 G PO CP24: 375.0000 mg | ORAL_CAPSULE | Freq: Every day | ORAL | 0 refills | Status: DC

## 2019-08-27 NOTE — Telephone Encounter (Signed)
rx sent

## 2019-08-31 ENCOUNTER — Encounter: Payer: Self-pay | Admitting: Family Medicine

## 2019-08-31 ENCOUNTER — Ambulatory Visit: Payer: 59 | Admitting: Family Medicine

## 2019-08-31 ENCOUNTER — Other Ambulatory Visit: Payer: Self-pay

## 2019-08-31 VITALS — BP 115/80 | HR 89 | Temp 99.3°F

## 2019-08-31 DIAGNOSIS — F17210 Nicotine dependence, cigarettes, uncomplicated: Secondary | ICD-10-CM

## 2019-08-31 DIAGNOSIS — G4700 Insomnia, unspecified: Secondary | ICD-10-CM | POA: Diagnosis not present

## 2019-08-31 DIAGNOSIS — F332 Major depressive disorder, recurrent severe without psychotic features: Secondary | ICD-10-CM | POA: Diagnosis not present

## 2019-08-31 DIAGNOSIS — K51919 Ulcerative colitis, unspecified with unspecified complications: Secondary | ICD-10-CM | POA: Diagnosis not present

## 2019-08-31 DIAGNOSIS — Z23 Encounter for immunization: Secondary | ICD-10-CM

## 2019-08-31 MED ORDER — MESALAMINE 400 MG PO CPDR: 800.0000 mg | DELAYED_RELEASE_CAPSULE | Freq: Two times a day (BID) | ORAL | 1 refills | Status: DC

## 2019-08-31 MED ORDER — VENLAFAXINE HCL ER 75 MG PO CP24: 75.0000 mg | ORAL_CAPSULE | Freq: Every day | ORAL | 1 refills | Status: DC

## 2019-08-31 NOTE — Patient Instructions (Signed)
Td Vaccine (Tetanus and Diphtheria): What You Need to Know 1. Why get vaccinated? Tetanus  and diphtheria are very serious diseases. They are rare in the [place] today, but people who do become infected often have severe complications. Td vaccine is used to protect adolescents and adults from both of these diseases. Both tetanus and diphtheria are infections caused by bacteria. Diphtheria spreads from person to person through coughing or sneezing. Tetanus-causing bacteria enter the body through cuts, scratches, or wounds. TETANUS (Lockjaw) causes painful muscle tightening and stiffness, usually all over the body.  It can lead to tightening of muscles in the head and neck so you can't open your mouth, swallow, or sometimes even breathe. Tetanus kills about 1 out of every 10 people who are infected even after receiving the best medical care. DIPHTHERIA can cause a thick coating to form in the back of the throat.  It can lead to breathing problems, paralysis, heart failure, and death. Before vaccines, as many as 200,000 cases of diphtheria and hundreds of cases of tetanus were reported in the [place] each year. Since vaccination began, reports of cases for both diseases have dropped by about 99%. 2. Td vaccine Td vaccine can protect adolescents and adults from tetanus and diphtheria. Td is usually given as a booster dose every 10 years but it can also be given earlier after a severe and dirty wound or burn. Another vaccine, called Tdap, which protects against pertussis in addition to tetanus and diphtheria, is sometimes recommended instead of Td vaccine. Your doctor or the person giving you the vaccine can give you more information. Td may safely be given at the same time as other vaccines. 3. Some people should not get this vaccine  A person who has ever had a life-threatening allergic reaction after a previous dose of any tetanus or diphtheria containing vaccine, OR has a severe allergy  to any part of this vaccine, should not get Td vaccine. Tell the person giving the vaccine about any severe allergies.  Talk to your doctor if you: ? had severe pain or swelling after any vaccine containing diphtheria or tetanus, ? ever had a condition called Guillain Barr Syndrome (GBS), ? aren't feeling well on the day the shot is scheduled. 4. Risks of a vaccine reaction With any medicine, including vaccines, there is a chance of side effects. These are usually mild and go away on their own. Serious reactions are also possible but are rare. Most people who get Td vaccine do not have any problems with it. Mild Problems following Td vaccine: (Did not interfere with activities)  Pain where the shot was given (about 8 people in 10)  Redness or swelling where the shot was given (about 1 person in 4)  Mild fever (rare)  Headache (about 1 person in 4)  Tiredness (about 1 person in 4) Moderate Problems following Td vaccine: (Interfered with activities, but did not require medical attention)  Fever over 102F (rare) Severe Problems following Td vaccine: (Unable to perform usual activities; required medical attention)  Swelling, severe pain, bleeding and/or redness in the arm where the shot was given (rare). Problems that could happen after any vaccine:  People sometimes faint after a medical procedure, including vaccination. Sitting or lying down for about 15 minutes can help prevent fainting, and injuries caused by a fall. Tell your doctor if you feel dizzy, or have vision changes or ringing in the ears.  Some people get severe pain in the shoulder and have  difficulty moving the arm where a shot was given. This happens very rarely.  Any medication can cause a severe allergic reaction. Such reactions from a vaccine are very rare, estimated at fewer than 1 in a million doses, and would happen within a few minutes to a few hours after the vaccination. As with any medicine, there is a  very remote chance of a vaccine causing a serious injury or death. The safety of vaccines is always being monitored. For more information, visit: http://www.aguilar.org/ 5. What if there is a serious reaction? What should I look for?  Look for anything that concerns you, such as signs of a severe allergic reaction, very high fever, or unusual behavior. Signs of a severe allergic reaction can include hives, swelling of the face and throat, difficulty breathing, a fast heartbeat, dizziness, and weakness. These would usually start a few minutes to a few hours after the vaccination. What should I do?  If you think it is a severe allergic reaction or other emergency that can't wait, call 9-1-1 or get the person to the nearest hospital. Otherwise, call your doctor.  Afterward, the reaction should be reported to the Vaccine Adverse Event Reporting System (VAERS). Your doctor might file this report, or you can do it yourself through the VAERS web site at www.vaers.SamedayNews.es, or by calling 646-139-9799. VAERS does not give medical advice. 6. The National Vaccine Injury Compensation Program The [employer] Vaccine Injury Compensation Program (VICP) is a federal program that was created to compensate people who may have been injured by certain vaccines. Persons who believe they may have been injured by a vaccine can learn about the program and about filing a claim by calling 503-080-5493 or visiting the [place] website at GoldCloset.com.ee. There is a time limit to file a claim for compensation. 7. How can I learn more?  Ask your doctor. He or she can give you the vaccine package insert or suggest other sources of information.  Call your local or state health department.  Contact the Centers for Disease Control and Prevention (CDC): ? Call 574-076-5949 (1-800-CDC-INFO) ? Visit CDC's website at http://hunter.com/ Vaccine Information Statement Td Vaccine (03/02/16) This information is  not intended to replace advice given to you by your health care provider. Make sure you discuss any questions you have with your health care provider. Document Released: 09/05/2006 Document Revised: 06/26/2018 Document Reviewed: 06/26/2018 Elsevier Interactive Patient Education  [employer].

## 2019-08-31 NOTE — Progress Notes (Signed)
BP 115/80   Pulse 89   Temp 99.3 F (37.4 C) (Oral)   SpO2 98%    Subjective:    Patient ID: Tracy Whitaker, female    DOB: 10-25-1981, 38 y.o.   MRN: [id number]  HPI: Tracy Whitaker is a 38 y.o. female  Chief Complaint  Patient presents with  . Depression  . Hypertension   Had Psychiatry consultation, effexor increased but this causes nausea so stopped taking it. Nausea resolved. Did better on the effexor. Was also given hydroxyzine but this did not seem to help the panic attacks. Has only needed it twice since starting anyway. Currently only taking seroquel, taking 100 mg nightly. Used to have a prn 25 mg dose and wanting to go back to that. Denies SI/HI. Still working with a Social worker regularly.   Delzicol is what works best for her UC.  on apriso currently which does not work as well for her and she cannot afford it. Having significantly more diarrhea on this formulation and wanting to switch back to delzicol.   Has chantix at home but has not yet taken it. Hopes to take it and get off cigarettes in the future.   Depression screen [facility] 2/9 08/31/2019 04/13/2019 02/09/2019  Decreased Interest 1 3 3   Down, Depressed, Hopeless 1 3 3   PHQ - 2 Score 2 6 6   Altered sleeping 0 3 3  Tired, decreased energy 1 3 3   Change in appetite 1 3 3   Feeling bad or failure about yourself  0 3 3  Trouble concentrating 0 3 3  Moving slowly or fidgety/restless 0 3 3  Suicidal thoughts 0 3 0  PHQ-9 Score 4 27 24   Difficult doing work/chores Not difficult at all - -    Relevant past medical, surgical, family and social history reviewed and updated as indicated. Interim medical history since our last visit reviewed. Allergies and medications reviewed and updated.  Review of Systems  Per HPI unless specifically indicated above     Objective:    BP 115/80   Pulse 89   Temp 99.3 F (37.4 C) (Oral)   SpO2 98%   Wt Readings from Last 3 Encounters:  04/20/19 197 lb (89.4 kg)  04/13/19  197 lb (89.4 kg)  01/12/19 200 lb 12.8 oz (91.1 kg)    Physical Exam Vitals signs and nursing note reviewed.  Constitutional:      Appearance: Normal appearance. She is not ill-appearing.  HENT:     Head: Atraumatic.  Eyes:     Extraocular Movements: Extraocular movements intact.     Conjunctiva/sclera: Conjunctivae normal.  Neck:     Musculoskeletal: Normal range of motion and neck supple.  Cardiovascular:     Rate and Rhythm: Normal rate and regular rhythm.     Heart sounds: Normal heart sounds.  Pulmonary:     Effort: Pulmonary effort is normal.     Breath sounds: Normal breath sounds.  Abdominal:     General: Bowel sounds are normal.     Palpations: Abdomen is soft.     Tenderness: There is no abdominal tenderness. There is no guarding.  Musculoskeletal: Normal range of motion.  Skin:    General: Skin is warm and dry.  Neurological:     Mental Status: She is alert and oriented to person, place, and time.  Psychiatric:        Mood and Affect: Mood normal.        Thought Content: Thought content normal.  Judgment: Judgment normal.     Results for orders placed or performed during the hospital encounter of 04/20/19  Urinalysis, Complete w Microscopic  Result Value Ref Range   Color, Urine YELLOW (A) YELLOW   APPearance HAZY (A) CLEAR   Specific Gravity, Urine 1.010 1.005 - 1.030   pH 6.0 5.0 - 8.0   Glucose, UA NEGATIVE NEGATIVE mg/dL   Hgb urine dipstick SMALL (A) NEGATIVE   Bilirubin Urine NEGATIVE NEGATIVE   Ketones, ur NEGATIVE NEGATIVE mg/dL   Protein, ur NEGATIVE NEGATIVE mg/dL   Nitrite NEGATIVE NEGATIVE   Leukocytes,Ua MODERATE (A) NEGATIVE  Pregnancy, urine POC  Result Value Ref Range   Preg Test, Ur NEGATIVE NEGATIVE      Assessment & Plan:   Problem List Items Addressed This Visit      Digestive   Ulcerative colitis ([place])    Will send in delzicol for her since this worked better than the apriso with less diarrhea sxs.         Other    Insomnia    Stable and under good control on seroquel 100-125 mg nightly. Continue current regimen      Depression    Will restart effexor at lower dose that she was successful on previously. Continue seroquel regimen additionally as well as counseling      Relevant Medications   venlafaxine XR (EFFEXOR XR) 75 MG 24 hr capsule   Cigarette smoker    Not ready to quit at this time, but has the chantix around for when she's ready       Other Visit Diagnoses    Need for Td vaccine    -  Primary   Relevant Orders   Td vaccine greater than or equal to 7yo preservative free IM (Completed)       Follow up plan: Return in about 6 months (around 02/29/2020) for CPE.

## 2019-09-04 NOTE — Assessment & Plan Note (Signed)
Will send in delzicol for her since this worked better than the apriso with less diarrhea sxs.

## 2019-09-04 NOTE — Assessment & Plan Note (Signed)
Will restart effexor at lower dose that she was successful on previously. Continue seroquel regimen additionally as well as counseling

## 2019-09-04 NOTE — Assessment & Plan Note (Signed)
Not ready to quit at this time, but has the chantix around for when she's ready

## 2019-09-04 NOTE — Assessment & Plan Note (Signed)
Stable and under good control on seroquel 100-125 mg nightly. Continue current regimen

## 2019-09-24 ENCOUNTER — Other Ambulatory Visit: Payer: Self-pay

## 2019-09-24 MED ORDER — ACYCLOVIR 800 MG PO TABS: 800.0000 mg | ORAL_TABLET | Freq: Two times a day (BID) | ORAL | 1 refills | Status: DC

## 2019-09-26 ENCOUNTER — Other Ambulatory Visit: Payer: Self-pay | Admitting: Family Medicine

## 2019-09-26 MED ORDER — MESALAMINE 400 MG PO CPDR: 800.0000 mg | DELAYED_RELEASE_CAPSULE | Freq: Two times a day (BID) | ORAL | 1 refills | Status: DC

## 2019-09-27 ENCOUNTER — Other Ambulatory Visit: Payer: Self-pay | Admitting: Family Medicine

## 2019-09-27 MED ORDER — MESALAMINE 400 MG PO CPDR: 800.0000 mg | DELAYED_RELEASE_CAPSULE | Freq: Two times a day (BID) | ORAL | 1 refills | Status: DC

## 2019-09-28 ENCOUNTER — Other Ambulatory Visit: Payer: Self-pay | Admitting: Family Medicine

## 2019-10-05 ENCOUNTER — Encounter

## 2019-10-05 ENCOUNTER — Encounter: Payer: Self-pay | Admitting: Obstetrics and Gynecology

## 2019-10-08 ENCOUNTER — Encounter: Payer: Self-pay | Admitting: Family Medicine

## 2019-10-12 ENCOUNTER — Encounter: Payer: Self-pay | Admitting: Obstetrics and Gynecology

## 2019-10-29 ENCOUNTER — Other Ambulatory Visit: Payer: Self-pay

## 2019-10-29 ENCOUNTER — Encounter: Payer: Self-pay | Admitting: Emergency Medicine

## 2019-10-29 ENCOUNTER — Ambulatory Visit (INDEPENDENT_AMBULATORY_CARE_PROVIDER_SITE_OTHER): Payer: 59

## 2019-10-29 ENCOUNTER — Ambulatory Visit
Admission: EM | Admit: 2019-10-29 | Discharge: 2019-10-29 | Disposition: A | Discharge status: Home / Self Care | Payer: 59 | Attending: Family Medicine | Admitting: Family Medicine

## 2019-10-29 ENCOUNTER — Ambulatory Visit: Payer: Self-pay | Admitting: Family Medicine

## 2019-10-29 DIAGNOSIS — M79672 Pain in left foot: Secondary | ICD-10-CM

## 2019-10-29 DIAGNOSIS — M25572 Pain in left ankle and joints of left foot: Secondary | ICD-10-CM

## 2019-10-29 DIAGNOSIS — S93402A Sprain of unspecified ligament of left ankle, initial encounter: Secondary | ICD-10-CM

## 2019-10-29 NOTE — Telephone Encounter (Signed)
Pt reports stood up last night and left ankle "Turned." Reports H/O "Fracturing my ankles like this." Reports swelling,moderate,  can not bear weight, 10/10 pain "Shoots up my leg." States did apply ice last night and kept elevated. NT called practice,unsure if practice has capability to perform XRays. Pt directed to [facility], per Christan's recommendation. Pt aware, states will follow disposition.   Reason for Disposition . [1] SEVERE pain AND [2] not improved 2 hours after pain medicine/ice packs  Answer Assessment - Initial Assessment Questions 1. MECHANISM: "How did the injury happen?" (e.g., twisting injury, direct blow)  Left ankle turned    2. ONSET: "When did the injury happen?" (Minutes or hours ago)      Last night 3. LOCATION: "Where is the injury located?"      Left ankle 4. APPEARANCE of INJURY: "What does the injury look like?"      Swelling 5. WEIGHT-BEARING: "Can you put weight on that foot?" "Can you walk (four steps or more)?"       No 6. SIZE: For cuts, bruises, or swelling, ask: "How large is it?" (e.g., inches or centimeters;  entire joint)      none 7. PAIN: "Is there pain?" If so, ask: "How bad is the pain?"    (e.g., Scale 1-10; or mild, moderate, severe)    Severe 8. TETANUS: For any breaks in the skin, ask: "When was the last tetanus booster?"     NA 9. OTHER SYMPTOMS: "Do you have any other symptoms?"      Shooting pains  Protocols used: ANKLE AND FOOT INJURY-A-AH

## 2019-10-29 NOTE — ED Provider Notes (Signed)
[facility] ____________________________________________  Time seen: Approximately 11:05 AM  I have reviewed the triage vital signs and the nursing notes.   HISTORY  Chief Complaint Ankle Pain (left)   HPI Tracy Whitaker is a 38 y.o. female presented for evaluation of left foot and left ankle pain after injury that occurred last night.  States she was going down her stairs and excellently rolled her ankle causing pain.  States she heard a Marine scientist ".  Reports multiple injuries to the similar previously including previous fractures, nonsurgical.  States applied a boot but still painful.  Did take Tylenol.  Denies pain radiation, paresthesias or other injury.  Reports otherwise doing well.  Denies recent sickness.  Patient's last menstrual period was 10/24/2019 (approximate).     Past Medical History:  Diagnosis Date  . ADHD (attention deficit hyperactivity disorder)   . Anxiety and depression 09/18/2017  . Controlled substance agreement broken 01/30/2018  . History of stomach ulcers   . 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 Hydronephrosis, left 11/06/2016  . Myalgia   . Polyarthralgia 08/31/2017  . Polycystic disease, ovaries   . Pulmonary nodules 08/25/2017   right lung, repeat imaging in 1 year  . Ulcerative colitis [facility])     Patient Active Problem List   Diagnosis Date Noted  . Anxiety disorder 05/04/2019  . Cigarette smoker 04/19/2019  . Depression 05/12/2018  . Controlled substance agreement broken 01/30/2018  . 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 Insomnia 09/18/2017  . Ulcerative colitis ([place]) 09/15/2017  . Polycystic disease, ovaries 09/15/2017  . Myalgia 08/31/2017  . Polyarthralgia 08/31/2017  . Hydronephrosis, left 11/06/2016  . ADHD (attention deficit hyperactivity disorder) 04/16/2016  . Obesity (BMI 30-39.9) 04/16/2016    Past Surgical History:  Procedure Laterality Date  . CYSTOSCOPY WITH STENT PLACEMENT Left 11/06/2016   Procedure: CYSTOSCOPY WITH  STENT PLACEMENT;  Surgeon: Nickie Retort, MD;  Location: [facility] ORS;  Service: Urology;  Laterality: Left;  . CYSTOSCOPY/URETEROSCOPY/HOLMIUM LASER/STENT PLACEMENT Left 11/27/2016   Procedure: CYSTOSCOPY/URETEROSCOPY/HOLMIUM LASER/STENT  rePLACEMENT;  Surgeon: Hollice Espy, MD;  Location: [facility] ORS;  Service: Urology;  Laterality: Left;  . Surgery for polycystic ovary       No current facility-administered medications for this encounter.   Current Outpatient Medications:  .  acyclovir (ZOVIRAX) 800 MG tablet, Take 1 tablet (800 mg total) by mouth 2 (two) times daily., Disp: 180 tablet, Rfl: 1 .  acyclovir ointment (ZOVIRAX) 5 %, Apply 1 application topically every 3 (three) hours., Disp: 30 g, Rfl: 3 .  amphetamine-dextroamphetamine (ADDERALL) 20 MG tablet, Take 1 tablet (20 mg total) by mouth 2 (two) times daily., Disp: 60 tablet, Rfl: 0 .  amphetamine-dextroamphetamine (ADDERALL) 20 MG tablet, Take 1 tablet (20 mg total) by mouth 2 (two) times daily., Disp: 60 tablet, Rfl: 0 .  LOW-OGESTREL 0.3-30 MG-MCG tablet, TAKE 1 TABLET BY MOUTH EVERY DAY, Disp: 84 tablet, Rfl: 1 .  Mesalamine (DELZICOL) 400 MG CPDR DR capsule, Take 2 capsules (800 mg total) by mouth 2 (two) times daily., Disp: 180 capsule, Rfl: 1 .  QUEtiapine (SEROQUEL) 100 MG tablet, Take 1 tablet (100 mg total) by mouth at bedtime., Disp: 90 tablet, Rfl: 0 .  QUEtiapine (SEROQUEL) 25 MG tablet, Take 1 tablet (25 mg total) by mouth at bedtime. To be combined with 100 mg, Disp: 90 tablet, Rfl: 0 .  spironolactone (ALDACTONE) 100 MG tablet, TAKE 1 TABLET BY MOUTH EVERY DAY, Disp: 90 tablet, Rfl: 1 .  metroNIDAZOLE (FLAGYL) 500 MG tablet, ,  Disp: , Rfl:  .  varenicline (CHANTIX STARTING MONTH PAK) 0.5 MG X 11 & 1 MG X 42 tablet, Take one 0.5 mg tablet by mouth once daily for 3 days, then increase to one 0.5 mg tablet twice daily for 4 days, then increase to one 1 mg tablet twice daily. (Patient not taking: Reported on 08/31/2019), Disp:  53 tablet, Rfl: 0 .  venlafaxine XR (EFFEXOR XR) 75 MG 24 hr capsule, Take 1 capsule (75 mg total) by mouth daily with breakfast., Disp: 90 capsule, Rfl: 1  Allergies Latex and Sulfamethoxazole-trimethoprim  Family History  Problem Relation Age of Onset  . Kidney Stones Father   . Cancer Maternal Grandmother   . Heart disease Paternal Aunt   . Kidney cancer Neg Hx   . Kidney disease Neg Hx   . Prostate cancer Neg Hx     Social History Social History   Tobacco Use  . Smoking status: Current Every Day Smoker    Packs/day: 0.50    Types: Cigarettes  . Smokeless tobacco: Never Used  Substance Use Topics  . Alcohol use: Yes    Comment: "once every couple of weeks"  . Drug use: No    Review of Systems Constitutional: No fever ENT: No sore throat. Cardiovascular: Denies chest pain. Respiratory: Denies shortness of breath. Musculoskeletal: Positive foot and ankle pain. Skin: Negative for rash.   ____________________________________________   PHYSICAL EXAM:  VITAL SIGNS: ED Triage Vitals  Enc Vitals Group     BP 10/29/19 1041 127/78     Pulse Rate 10/29/19 1041 92     Resp 10/29/19 1041 18     Temp 10/29/19 1041 98.3 F (36.8 C)     Temp Source 10/29/19 1041 Oral     SpO2 10/29/19 1041 100 %     Weight 10/29/19 1037 200 lb (90.7 kg)     Height 10/29/19 1037 5\' 4"  (1.626 m)     Head Circumference --      Peak Flow --      Pain Score 10/29/19 1037 8     Pain Loc --      Pain Edu? --      Excl. in [place]? --     Constitutional: Alert and oriented. Well appearing and in no acute distress. Eyes: Conjunctivae are normal.  ENT      Head: Normocephalic and atraumatic. Cardiovascular:  Good peripheral circulation. Respiratory: Normal respiratory effort without tachypnea nor retractions.  Musculoskeletal: Left foot distal pedal pulses equal posterior tibialis and dorsalis pedis. Except: Left lateral malleolus and ATFL ligament tenderness to direct palpation with mild  localized swelling, mild pain proximal fifth metatarsal, pain with range of motion, normal distal sensation and capillary refill.  Left lower extremity otherwise nontender to direct palpation.  Gait not tested due to pain. Neurologic:  Normal speech and language.  Skin:  Skin is warm, dry and intact. No rash noted. Psychiatric: Mood and affect are normal. Speech and behavior are normal. Patient exhibits appropriate insight and judgment   ___________________________________________   LABS (all labs ordered are listed, but only abnormal results are displayed)  Labs Reviewed - No data to display ____________________________________________  RADIOLOGY  Dg Ankle Complete Left  Result Date: 10/29/2019 CLINICAL DATA:  Lateral malleolus pain. EXAM: LEFT ANKLE COMPLETE - 3+ VIEW COMPARISON:  None. FINDINGS: Corticated ossicles seen both medially and laterally. No acute fracture or malalignment. No evidence of ankle joint effusion. Plantar heel spur without erosion. IMPRESSION: No acute osseous finding  or degenerative joint narrowing. Electronically Signed   By: Monte Fantasia M.D.   On: 10/29/2019 11:12   Dg Foot Complete Left  Result Date: 10/29/2019 CLINICAL DATA:  Posttraumatic lateral malleolus pain. EXAM: LEFT FOOT - COMPLETE 3+ VIEW COMPARISON:  None. FINDINGS: There is no evidence of fracture or dislocation. First MTP osteoarthritic joint narrowing and marginal spurring. Plantar heel spur. IMPRESSION: 1. No acute finding. 2. Osteoarthritis of the first MTP joint. Electronically Signed   By: Monte Fantasia M.D.   On: 10/29/2019 11:09   ____________________________________________   PROCEDURES Procedures    INITIAL IMPRESSION / ASSESSMENT AND PLAN / ED COURSE  Pertinent labs & imaging results that were available during my care of the patient were reviewed by me and considered in my medical decision making (see chart for details).  Well-appearing patient.  No acute distress.  Left  foot ankle pain post injury.  Left foot and ankle x-rays as above per radiologist, reviewed, no acute osseous finding, osteoarthritis of the first MTP joint.  Discussed this with patient.  Patient has multiple braces including Aircast and boot.  Patient does request crutches as she cannot find hers.  Crutches given.  Over-the-counter Tylenol ibuprofen as needed.  Work note given for today and tomorrow.  Ice, elevate, supportive care and gradual increase weightbearing as tolerated.  Follow-up with orthopedic for continued pain.  Discussed follow up and return parameters including no resolution or any worsening concerns. Patient verbalized understanding and agreed to plan.   ____________________________________________   FINAL CLINICAL IMPRESSION(S) / ED DIAGNOSES  Final diagnoses:  Sprain of left ankle, unspecified ligament, initial encounter  Left foot pain     ED Discharge Orders    None       Note: This dictation was prepared with Dragon dictation along with smaller phrase technology. Any transcriptional errors that result from this process are unintentional.         Marylene Land, NP 10/29/19 1130

## 2019-10-29 NOTE — ED Triage Notes (Signed)
Pt c/o left ankle pain. She states that she rolled her ankle over last night. She states that she heard a "crunch". She states that the ankle is bruised and swollen. She has a boot on upon arrival. She states she can not wiggle her toes or bare weight.

## 2019-10-29 NOTE — Discharge Instructions (Addendum)
Ice. Rest. Drink plenty of fluids. Brace and crutches.  Gradually apply weight as tolerated.  Follow up with your primary care physician or above this week as needed. Return to Urgent care for new or worsening concerns.

## 2019-11-02 ENCOUNTER — Ambulatory Visit (INDEPENDENT_AMBULATORY_CARE_PROVIDER_SITE_OTHER): Payer: 59 | Admitting: Certified Nurse Midwife

## 2019-11-02 ENCOUNTER — Other Ambulatory Visit: Payer: Self-pay

## 2019-11-02 ENCOUNTER — Encounter: Payer: Self-pay | Admitting: Certified Nurse Midwife

## 2019-11-02 VITALS — BP 122/79 | HR 122 | Ht 64.0 in | Wt 202.2 lb

## 2019-11-02 DIAGNOSIS — Z01419 Encounter for gynecological examination (general) (routine) without abnormal findings: Secondary | ICD-10-CM | POA: Diagnosis not present

## 2019-11-02 MED ORDER — LOW-OGESTREL 0.3-30 MG-MCG PO TABS: 1.0000 | ORAL_TABLET | Freq: Every day | ORAL | 1 refills | Status: DC

## 2019-11-02 MED ORDER — OMEPRAZOLE 20 MG PO CPDR: 20.0000 mg | DELAYED_RELEASE_CAPSULE | Freq: Every day | ORAL | 2 refills | Status: DC

## 2019-11-02 NOTE — Progress Notes (Signed)
GYNECOLOGY ANNUAL PREVENTATIVE CARE ENCOUNTER NOTE  History:     Tracy Whitaker is a 38 y.o. G1P0 female here for a routine annual gynecologic exam.  Current complaints: none.   Denies abnormal vaginal bleeding, discharge, pelvic pain, problems with intercourse or other gynecologic concerns.     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 Contraception: OCP (estrogen/progesterone) Last Pap: 2018  Results were: normal with negative HPV Last mammogram: n/a  Obstetric History OB History  Gravida Para Term Preterm AB Living  1            SAB TAB Ectopic Multiple Live Births               # Outcome Date GA Lbr Len/2nd Weight Sex Delivery Anes PTL Lv  1 Gravida 2008    F Vag-Spont       Past Medical History:  Diagnosis Date  . ADHD (attention deficit hyperactivity disorder)   . Anxiety and depression 09/18/2017  . Controlled substance agreement broken 01/30/2018  . History of stomach ulcers   . 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 Hydronephrosis, left 11/06/2016  . Myalgia   . Polyarthralgia 08/31/2017  . Polycystic disease, ovaries   . Pulmonary nodules 08/25/2017   right lung, repeat imaging in 1 year  . Ulcerative colitis [facility])     Past Surgical History:  Procedure Laterality Date  . CYSTOSCOPY WITH STENT PLACEMENT Left 11/06/2016   Procedure: CYSTOSCOPY WITH STENT PLACEMENT;  Surgeon: Nickie Retort, MD;  Location: [facility] ORS;  Service: Urology;  Laterality: Left;  . CYSTOSCOPY/URETEROSCOPY/HOLMIUM LASER/STENT PLACEMENT Left 11/27/2016   Procedure: CYSTOSCOPY/URETEROSCOPY/HOLMIUM LASER/STENT  rePLACEMENT;  Surgeon: Hollice Espy, MD;  Location: [facility] ORS;  Service: Urology;   Laterality: Left;  . Surgery for polycystic ovary      Current Outpatient Medications on File Prior to Visit  Medication Sig Dispense Refill  . acyclovir (ZOVIRAX) 800 MG tablet Take 1 tablet (800 mg total) by mouth 2 (two) times daily. 180 tablet 1  . acyclovir ointment (ZOVIRAX) 5 % Apply 1 application topically every 3 (three) hours. 30 g 3  . amphetamine-dextroamphetamine (ADDERALL) 20 MG tablet Take 20 mg by mouth 2 (two) times daily.    . LOW-OGESTREL 0.3-30 MG-MCG tablet TAKE 1 TABLET BY MOUTH EVERY DAY 84 tablet 1  . Mesalamine (DELZICOL) 400 MG CPDR DR capsule Take 2 capsules (800 mg total) by mouth 2 (two) times daily. 180 capsule 1  . QUEtiapine (SEROQUEL) 100 MG tablet Take 1 tablet (100 mg total) by mouth at bedtime. 90 tablet 0  . QUEtiapine (SEROQUEL) 25 MG tablet Take 1 tablet (25 mg total) by mouth at bedtime. To be combined with 100 mg 90 tablet 0  . spironolactone (ALDACTONE) 100 MG tablet TAKE 1 TABLET BY MOUTH EVERY DAY 90 tablet 1  . venlafaxine XR (EFFEXOR XR) 75 MG 24 hr capsule Take 1 capsule (75 mg total) by mouth daily with breakfast. 90 capsule 1  . amphetamine-dextroamphetamine (ADDERALL) 20 MG tablet Take 1 tablet (20 mg total) by mouth 2 (two) times daily. 60 tablet 0  . amphetamine-dextroamphetamine (ADDERALL) 20 MG tablet Take 1 tablet (20 mg total) by mouth 2 (two) times daily. 60 tablet 0  . metroNIDAZOLE (FLAGYL) 500 MG  tablet     . varenicline (CHANTIX STARTING MONTH PAK) 0.5 MG X 11 & 1 MG X 42 tablet Take one 0.5 mg tablet by mouth once daily for 3 days, then increase to one 0.5 mg tablet twice daily for 4 days, then increase to one 1 mg tablet twice daily. (Patient not taking: Reported on 08/31/2019) 53 tablet 0   No current facility-administered medications on file prior to visit.    Allergies  Allergen Reactions  . Latex Dermatitis  . Sulfamethoxazole-Trimethoprim Other (See Comments)    Joint pain    Social History:  reports that she has been  smoking cigarettes. She has been smoking about 0.50 packs per day. She has never used smokeless tobacco. She reports current alcohol use. She reports that she does not use drugs.  Family History  Problem Relation Age of Onset  . Kidney Stones Father   . Cancer Maternal Grandmother   . Heart disease Paternal Aunt   . Kidney cancer Neg Hx   . Kidney disease Neg Hx   . Prostate cancer Neg Hx     The following portions of the patient's history were reviewed and updated as appropriate: allergies, current medications, past family history, past medical history, past social history, past surgical history and problem list.  Review of Systems Pertinent items noted in HPI and remainder of comprehensive ROS otherwise negative.  Physical Exam:  BP 122/79   Pulse (!) 122   Ht 5' 4"  (1.626 m)   Wt 202 lb 4 oz (91.7 kg)   LMP 10/24/2019 (Approximate)   BMI 34.72 kg/m  CONSTITUTIONAL: Well-developed, well-nourished obese female in no acute distress.  HENT:  Normocephalic, atraumatic, External right and left ear normal. Oropharynx is clear and moist EYES: Conjunctivae and EOM are normal. Pupils are equal, round, and reactive to light. No scleral icterus.  NECK: Normal range of motion, supple, no masses.  Normal thyroid.  SKIN: Skin is warm and dry. No rash noted. Not diaphoretic. No erythema. No pallor. MUSCULOSKELETAL: Normal range of motion. No tenderness.  No cyanosis, clubbing, or edema.  2+ distal pulses. NEUROLOGIC: Alert and oriented to person, place, and time. Normal reflexes, muscle tone coordination.  PSYCHIATRIC: Normal mood and affect. Normal behavior. Normal judgment and thought content. CARDIOVASCULAR: Normal heart rate noted, regular rhythm RESPIRATORY: Clear to auscultation bilaterally. Effort and breath sounds normal, no problems with respiration noted. BREASTS: Symmetric in size. No masses, tenderness, skin changes, nipple drainage, or lymphadenopathy bilaterally. ABDOMEN: Soft,  no distention noted.  No tenderness, rebound or guarding.  PELVIC: Normal appearing external genitalia and urethral meatus; normal appearing vaginal mucosa and cervix.  No abnormal discharge noted.  Not due.  Normal uterine size, no other palpable masses, no uterine or adnexal tenderness.   Assessment and Plan:  Annual Well Women Gyn Exam Pap smear not indicated Mammogram not indicated Orders: refill BC and omeprazole Labs: none due Routine preventative health maintenance measures emphasized. Please refer to After Visit Summary for other counseling recommendations.  Pt request to start wt loss program. Discussed not able to start until the end of February due to staffing. Encouraged pt to start wt loss with dietary changes and exercises. Offered nutrition consult. Pt declined at this time. States that she wants to stop smoking first.     Philip Aspen, CNM

## 2019-11-02 NOTE — Patient Instructions (Signed)
Preventive Care 21-39 Years Old, Female Preventive care refers to visits with your health care provider and lifestyle choices that can promote health and wellness. This includes:  A yearly physical exam. This may also be called an annual well check.  Regular dental visits and eye exams.  Immunizations.  Screening for certain conditions.  Healthy lifestyle choices, such as eating a healthy diet, getting regular exercise, not using drugs or products that contain nicotine and tobacco, and limiting alcohol use. What can I expect for my preventive care visit? Physical exam Your health care provider will check your:  Height and weight. This may be used to calculate body mass index (BMI), which tells if you are at a healthy weight.  Heart rate and blood pressure.  Skin for abnormal spots. Counseling Your health care provider may ask you questions about your:  Alcohol, tobacco, and drug use.  Emotional well-being.  Home and relationship well-being.  Sexual activity.  Eating habits.  Work and work environment.  Method of birth control.  Menstrual cycle.  Pregnancy history. What immunizations do I need?  Influenza (flu) vaccine  This is recommended every year. Tetanus, diphtheria, and pertussis (Tdap) vaccine  You may need a Td booster every 10 years. Varicella (chickenpox) vaccine  You may need this if you have not been vaccinated. Human papillomavirus (HPV) vaccine  If recommended by your health care provider, you may need three doses over 6 months. Measles, mumps, and rubella (MMR) vaccine  You may need at least one dose of MMR. You may also need a second dose. Meningococcal conjugate (MenACWY) vaccine  One dose is recommended if you are age 19-21 years and a first-year college student living in a residence hall, or if you have one of several medical conditions. You may also need additional booster doses. Pneumococcal conjugate (PCV13) vaccine  You may need  this if you have certain conditions and were not previously vaccinated. Pneumococcal polysaccharide (PPSV23) vaccine  You may need one or two doses if you smoke cigarettes or if you have certain conditions. Hepatitis A vaccine  You may need this if you have certain conditions or if you travel or work in places where you may be exposed to hepatitis A. Hepatitis B vaccine  You may need this if you have certain conditions or if you travel or work in places where you may be exposed to hepatitis B. Haemophilus influenzae type b (Hib) vaccine  You may need this if you have certain conditions. You may receive vaccines as individual doses or as more than one vaccine together in one shot (combination vaccines). Talk with your health care provider about the risks and benefits of combination vaccines. What tests do I need?  Blood tests  Lipid and cholesterol levels. These may be checked every 5 years starting at age 20.  Hepatitis C test.  Hepatitis B test. Screening  Diabetes screening. This is done by checking your blood sugar (glucose) after you have not eaten for a while (fasting).  Sexually transmitted disease (STD) testing.  BRCA-related cancer screening. This may be done if you have a family history of breast, ovarian, tubal, or peritoneal cancers.  Pelvic exam and Pap test. This may be done every 3 years starting at age 21. Starting at age 30, this may be done every 5 years if you have a Pap test in combination with an HPV test. Talk with your health care provider about your test results, treatment options, and if necessary, the need for more tests.   Follow these instructions at home: Eating and drinking   Eat a diet that includes fresh fruits and vegetables, whole grains, lean protein, and low-fat dairy.  Take vitamin and mineral supplements as recommended by your health care provider.  Do not drink alcohol if: ? Your health care provider tells you not to drink. ? You are  pregnant, may be pregnant, or are planning to become pregnant.  If you drink alcohol: ? Limit how much you have to 0-1 drink a day. ? Be aware of how much alcohol is in your drink. In the [place] one drink equals one 12 oz bottle of beer (355 mL), one 5 oz glass of wine (148 mL), or one 1 oz glass of hard liquor (44 mL). Lifestyle  Take daily care of your teeth and gums.  Stay active. Exercise for at least 30 minutes on 5 or more days each week.  Do not use any products that contain nicotine or tobacco, such as cigarettes, e-cigarettes, and chewing tobacco. If you need help quitting, ask your health care provider.  If you are sexually active, practice safe sex. Use a condom or other form of birth control (contraception) in order to prevent pregnancy and STIs (sexually transmitted infections). If you plan to become pregnant, see your health care provider for a preconception visit. What's next?  Visit your health care provider once a year for a well check visit.  Ask your health care provider how often you should have your eyes and teeth checked.  Stay up to date on all vaccines. This information is not intended to replace advice given to you by your health care provider. Make sure you discuss any questions you have with your health care provider. Document Released: 01/04/2002 Document Revised: 07/20/2018 Document Reviewed: 07/20/2018 Elsevier Patient Education  2020 [employer] Inc[employer]  

## 2019-11-02 NOTE — Progress Notes (Signed)
Patient is here for an annual exam. She would like a refill on Adderal, flagyl and prilosec.

## 2019-11-14 ENCOUNTER — Ambulatory Visit: Payer: 59 | Admitting: Family Medicine

## 2019-11-14 ENCOUNTER — Encounter: Payer: Self-pay | Admitting: Family Medicine

## 2019-11-14 ENCOUNTER — Other Ambulatory Visit: Payer: Self-pay

## 2019-11-14 VITALS — BP 115/82 | HR 74 | Temp 97.8°F | Ht 64.0 in | Wt 203.0 lb

## 2019-11-14 DIAGNOSIS — F430 Acute stress reaction: Secondary | ICD-10-CM | POA: Diagnosis not present

## 2019-11-14 DIAGNOSIS — F3132 Bipolar disorder, current episode depressed, moderate: Secondary | ICD-10-CM

## 2019-11-14 DIAGNOSIS — E669 Obesity, unspecified: Secondary | ICD-10-CM

## 2019-11-14 DIAGNOSIS — F909 Attention-deficit hyperactivity disorder, unspecified type: Secondary | ICD-10-CM

## 2019-11-14 DIAGNOSIS — F17210 Nicotine dependence, cigarettes, uncomplicated: Secondary | ICD-10-CM

## 2019-11-14 DIAGNOSIS — F332 Major depressive disorder, recurrent severe without psychotic features: Secondary | ICD-10-CM

## 2019-11-14 MED ORDER — VENLAFAXINE HCL ER 75 MG PO CP24: 75.0000 mg | ORAL_CAPSULE | Freq: Every day | ORAL | 1 refills | Status: DC

## 2019-11-14 MED ORDER — QUETIAPINE FUMARATE 25 MG PO TABS: 25.0000 mg | ORAL_TABLET | Freq: Every day | ORAL | 1 refills | Status: DC

## 2019-11-14 MED ORDER — PEN NEEDLES 32G X 6 MM MISC: 1.0000 [IU] | Freq: Every day | 0 refills | Status: DC

## 2019-11-14 MED ORDER — SAXENDA 18 MG/3ML ~~LOC~~ SOPN: PEN_INJECTOR | SUBCUTANEOUS | 2 refills | Status: DC

## 2019-11-14 MED ORDER — QUETIAPINE FUMARATE 100 MG PO TABS: 100.0000 mg | ORAL_TABLET | Freq: Every day | ORAL | 1 refills | Status: DC

## 2019-11-14 NOTE — Progress Notes (Signed)
BP 115/82   Pulse 74   Temp 97.8 F (36.6 C) (Oral)   Ht 5' 4"  (1.626 m)   Wt 203 lb (92.1 kg)   LMP 10/24/2019 (Approximate)   SpO2 99%   BMI 34.84 kg/m    Subjective:    Patient ID: Tracy Whitaker, female    DOB: 02/06/1981, 38 y.o.   MRN: [id number]  HPI: Tracy Whitaker is a 38 y.o. female  Chief Complaint  Patient presents with  . Depression  . Weight Check   Patient presenting for depression f/u. Doing much better back on the lower effexor dose and taking the seroquel at bedtime. Trying not to let situational things bother her as much either. Less crying spells, agitation, mood swings now.   Concerned about her weight still. Starting to have foot pain from plantar fasciitis and feels it's from weight gain. Notes she gets so hungry in the evenings and finds herself frequently eating tons of junk food after dinner right before bed nightly. Tried phentermine and B12 shots in the past but had migraines with these and the weight came right back.   On adderall for ADHD for years, which has been managed by GYN up until now. Provider left and needing to transfer this med for management with different provider. Last fill date was 10/28. Tolerating well wtihout issue, getting good benefit from it with regard to focus and task completion. Denies CP, SOB, palpitations.   Still has not started chantix, but very much wanting to to quit smoking and does plan to try it soon.   Depression screen [facility] 2/9 11/14/2019 08/31/2019 04/13/2019  Decreased Interest 0 1 3  Down, Depressed, Hopeless 0 1 3  PHQ - 2 Score 0 2 6  Altered sleeping 0 0 3  Tired, decreased energy 0 1 3  Change in appetite 1 1 3   Feeling bad or failure about yourself  0 0 3  Trouble concentrating 0 0 3  Moving slowly or fidgety/restless 0 0 3  Suicidal thoughts 0 0 3  PHQ-9 Score 1 4 27   Difficult doing work/chores - Not difficult at all -   GAD 7 : Generalized Anxiety Score 11/14/2019 04/13/2019 01/12/2019    Nervous, Anxious, on Edge 0 3 3  Control/stop worrying 0 3 3  Worry too much - different things 1 3 3   Trouble relaxing 0 3 3  Restless 0 2 3  Easily annoyed or irritable 0 3 3  Afraid - awful might happen 0 3 3  Total GAD 7 Score 1 20 21   Anxiety Difficulty Not difficult at all - Extremely difficult     Relevant past medical, surgical, family and social history reviewed and updated as indicated. Interim medical history since our last visit reviewed. Allergies and medications reviewed and updated.  Review of Systems  Per HPI unless specifically indicated above     Objective:    BP 115/82   Pulse 74   Temp 97.8 F (36.6 C) (Oral)   Ht 5' 4"  (1.626 m)   Wt 203 lb (92.1 kg)   LMP 10/24/2019 (Approximate)   SpO2 99%   BMI 34.84 kg/m   Wt Readings from Last 3 Encounters:  11/14/19 203 lb (92.1 kg)  11/02/19 202 lb 4 oz (91.7 kg)  10/29/19 200 lb (90.7 kg)    Physical Exam  Results for orders placed or performed during the hospital encounter of 04/20/19  Urinalysis, Complete w Microscopic  Result Value Ref Range  Color, Urine YELLOW (A) YELLOW   APPearance HAZY (A) CLEAR   Specific Gravity, Urine 1.010 1.005 - 1.030   pH 6.0 5.0 - 8.0   Glucose, UA NEGATIVE NEGATIVE mg/dL   Hgb urine dipstick SMALL (A) NEGATIVE   Bilirubin Urine NEGATIVE NEGATIVE   Ketones, ur NEGATIVE NEGATIVE mg/dL   Protein, ur NEGATIVE NEGATIVE mg/dL   Nitrite NEGATIVE NEGATIVE   Leukocytes,Ua MODERATE (A) NEGATIVE  Pregnancy, urine POC  Result Value Ref Range   Preg Test, Ur NEGATIVE NEGATIVE      Assessment & Plan:   Problem List Items Addressed This Visit      Other   ADHD (attention deficit hyperactivity disorder)    Will assume management of her stimulant therapy with close monitoring. Continue current regimen.       Obesity (BMI 30-39.9)    Reduce nighttime eating, increase exercise. Will trial saxenda and monitor closely for benefit. Does not wish to d/c seroquel due to  increased appetite because she has tried to come off before and this seems to work best for her      Relevant Medications   Liraglutide -Weight Management (SAXENDA) 18 MG/3ML SOPN   amphetamine-dextroamphetamine (ADDERALL) 20 MG tablet (Start on 01/20/2020)   amphetamine-dextroamphetamine (ADDERALL) 20 MG tablet (Start on 12/20/2019)   amphetamine-dextroamphetamine (ADDERALL) 20 MG tablet   Depression - Primary    Improved on lower dose effexor, continue current regimen      Relevant Medications   venlafaxine XR (EFFEXOR XR) 75 MG 24 hr capsule   Cigarette smoker    Has not yet started chantix but plans to soon.        Other Visit Diagnoses    Acute stress disorder       Relevant Medications   QUEtiapine (SEROQUEL) 25 MG tablet   venlafaxine XR (EFFEXOR XR) 75 MG 24 hr capsule   Bipolar 1 disorder, depressed, moderate (HCC)       Relevant Medications   QUEtiapine (SEROQUEL) 25 MG tablet       Follow up plan: Return in about 6 weeks (around 12/26/2019) for weight f/u.

## 2019-11-19 ENCOUNTER — Telehealth: Payer: Self-pay

## 2019-11-19 DIAGNOSIS — F3132 Bipolar disorder, current episode depressed, moderate: Secondary | ICD-10-CM

## 2019-11-19 DIAGNOSIS — F430 Acute stress reaction: Secondary | ICD-10-CM

## 2019-11-19 MED ORDER — AMPHETAMINE-DEXTROAMPHETAMINE 20 MG PO TABS: 20.0000 mg | ORAL_TABLET | Freq: Two times a day (BID) | ORAL | 0 refills | Status: DC

## 2019-11-19 NOTE — Telephone Encounter (Signed)
Prior Authorization initiated via CoverMyMeds for Saxenda 18MG/3ML pen-injectors Key: [id number]

## 2019-11-19 NOTE — Assessment & Plan Note (Signed)
Improved on lower dose effexor, continue current regimen

## 2019-11-19 NOTE — Telephone Encounter (Signed)
This encounter was created in error - please disregard.

## 2019-11-19 NOTE — Assessment & Plan Note (Signed)
Reduce nighttime eating, increase exercise. Will trial saxenda and monitor closely for benefit. Does not wish to d/c seroquel due to increased appetite because she has tried to come off before and this seems to work best for her

## 2019-11-19 NOTE — Assessment & Plan Note (Signed)
Will assume management of her stimulant therapy with close monitoring. Continue current regimen.

## 2019-11-19 NOTE — Assessment & Plan Note (Signed)
Has not yet started chantix but plans to soon.

## 2019-12-03 ENCOUNTER — Encounter: Payer: Self-pay | Admitting: Family Medicine

## 2019-12-06 ENCOUNTER — Other Ambulatory Visit: Payer: Self-pay | Admitting: Family Medicine

## 2019-12-06 MED ORDER — TOPIRAMATE 25 MG PO TABS: 25.0000 mg | ORAL_TABLET | Freq: Two times a day (BID) | ORAL | 0 refills | Status: DC

## 2019-12-17 ENCOUNTER — Encounter: Payer: Self-pay | Admitting: Family Medicine

## 2019-12-17 ENCOUNTER — Other Ambulatory Visit: Payer: Self-pay | Admitting: Family Medicine

## 2019-12-17 ENCOUNTER — Telehealth: Payer: Self-pay | Admitting: Family Medicine

## 2019-12-17 NOTE — Telephone Encounter (Signed)
Requested medication (s) are due for refill today: yes  Requested medication (s) are on the active medication list: yes  Last refill:  09/26/2019  Future visit scheduled: yes  Notes to clinic:  last ordered by different provider  Review for refill   Requested Prescriptions  Pending Prescriptions Disp Refills   CRYSELLE-28 0.3-30 MG-MCG tablet [Pharmacy Med Name: CRYSELLE TABLETS 28] 84 tablet 1    Sig: TAKE 1 TABLET BY MOUTH EVERY DAY      OB/GYN:  Contraceptives Passed - 12/17/2019  3:41 AM      Passed - Last BP in normal range    BP Readings from Last 1 Encounters:  11/14/19 115/82          Passed - Valid encounter within last 12 months    Recent Outpatient Visits           1 month ago Severe episode of recurrent major depressive disorder, without psychotic features ([place])   [facility] Volney American, PA-C   3 months ago Need for Td vaccine   [facility], [place], [place]   8 months ago Severe episode of recurrent major depressive disorder, without psychotic features [facility])   [facility] Volney American, [place]   10 months ago Severe episode of recurrent major depressive disorder, without psychotic features [facility])   [facility] Volney American, [place]   11 months ago Severe episode of recurrent major depressive disorder, without psychotic features [facility])   [place], [place], [place]       Future Appointments             In 1 week Orene Desanctis, Lilia Argue, [place], [facility]

## 2019-12-17 NOTE — Telephone Encounter (Signed)
Routing to provider  

## 2019-12-17 NOTE — Telephone Encounter (Signed)
Patient states that her hands, feet and back are in a lot of pain.  Patient states that all of her pain are hot to touch.  Patient wanted a same day appt but PCP does not have anything.  Looked at acute for other providers and all booked. Patient is requesting a referral to a specialist.  And also a call back from nurse asap. Call back [phone]

## 2019-12-18 ENCOUNTER — Ambulatory Visit (INDEPENDENT_AMBULATORY_CARE_PROVIDER_SITE_OTHER): Payer: 59 | Admitting: Nurse Practitioner

## 2019-12-18 ENCOUNTER — Encounter: Payer: Self-pay | Admitting: Nurse Practitioner

## 2019-12-18 ENCOUNTER — Other Ambulatory Visit: Payer: Self-pay

## 2019-12-18 VITALS — BP 117/83 | HR 99 | Temp 97.9°F | Ht 64.5 in | Wt 196.0 lb

## 2019-12-18 DIAGNOSIS — R5383 Other fatigue: Secondary | ICD-10-CM | POA: Diagnosis not present

## 2019-12-18 DIAGNOSIS — M25572 Pain in left ankle and joints of left foot: Secondary | ICD-10-CM | POA: Diagnosis not present

## 2019-12-18 DIAGNOSIS — M25541 Pain in joints of right hand: Secondary | ICD-10-CM | POA: Diagnosis not present

## 2019-12-18 DIAGNOSIS — M25571 Pain in right ankle and joints of right foot: Secondary | ICD-10-CM | POA: Insufficient documentation

## 2019-12-18 DIAGNOSIS — M25542 Pain in joints of left hand: Secondary | ICD-10-CM

## 2019-12-18 LAB — UA/M W/RFLX CULTURE, ROUTINE
Bilirubin, UA: NEGATIVE
Glucose, UA: NEGATIVE
Leukocytes,UA: NEGATIVE
Nitrite, UA: NEGATIVE
Protein,UA: NEGATIVE
Specific Gravity, UA: 1.01 (ref 1.005–1.030)
Urobilinogen, Ur: 0.2 mg/dL (ref 0.2–1.0)
pH, UA: 5.5 (ref 5.0–7.5)

## 2019-12-18 LAB — BAYER DCA HB A1C WAIVED: HB A1C (BAYER DCA - WAIVED): 5 % (ref <7.0)

## 2019-12-18 LAB — MICROSCOPIC EXAMINATION
Bacteria, UA: NONE SEEN
WBC, UA: NONE SEEN /hpf (ref 0–5)

## 2019-12-18 MED ORDER — VENLAFAXINE HCL ER 150 MG PO CP24: 150.0000 mg | ORAL_CAPSULE | Freq: Every day | ORAL | 0 refills | Status: DC

## 2019-12-18 NOTE — Progress Notes (Signed)
BP 117/83 (BP Location: Left Arm, Patient Position: Sitting, Cuff Size: Normal)   Pulse 99   Temp 97.9 F (36.6 C) (Oral)   Ht 5' 4.5" (1.638 m)   Wt 196 lb (88.9 kg)   SpO2 97%   BMI 33.12 kg/m    Subjective:    Patient ID: Tracy Whitaker, female    DOB: 08-03-1981, 39 y.o.   MRN: [id number]  HPI: Tracy Whitaker is a 39 y.o. female  Chief Complaint  Patient presents with  . Arthritis    Severe pain in feet, hands, neck, and back.  . Night Sweats  . Fatigue  . Dry Eyes   ARTHRALGIAS / JOINT ACHES Hands/feet have hurt sporadically whole life Duration: months Pain: yes Symmetric: yes, but left more painful than right on whole body Severity: severe Quality: aching and throbbing Frequency: constant Context:  worse Decreased function/range of motion: yes Erythema: yes Swelling: yes Heat or warmth: yes Morning stiffness: yes; hard to get out of bed in the morning; also very stiff at night after work Aggravating factors: constant Alleviating factors: ice wraps, heat wraps Relief with NSAIDs?: unable to take due to ulcerative colitis Treatments attempted: glucosamine, topamax, voltaren gel, ice, CBD cream, Tylenol Involved Joints:     Hands: yes bilateral    Wrists: no      Elbows: no     Shoulders: no     Back: yes - lower    Hips: no     Knees: sometimes bilateral    Ankles: no     Feet: yes bilateral Burning pain: yes Numbness/parethesia: yes; pain shoots down left leg/makes leg numb, feet numb on and off Night sweats: yes Dry eyes: yes  Patient in the past, she had x-rays that showed "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  She has previously seen a chiropractor for the pain in her shoulder blades.  She states she thinks that something is wrong "on the inside" and feels that all of her symptoms are related.  Allergies  Allergen Reactions  . Latex Dermatitis  . Sulfamethoxazole-Trimethoprim Other (See Comments)    Joint pain    Outpatient Encounter Medications as of 12/18/2019  Medication Sig  . acyclovir (ZOVIRAX) 800 MG tablet Take 1 tablet (800 mg total) by mouth 2 (two) times daily.  Marland Kitchen acyclovir ointment (ZOVIRAX) 5 % Apply 1 application topically every 3 (three) hours.  Marland Kitchen amphetamine-dextroamphetamine (ADDERALL) 20 MG tablet Take 1 tablet (20 mg total) by mouth 2 (two) times daily.  . CRYSELLE-28 0.3-30 MG-MCG tablet TAKE 1 TABLET BY MOUTH EVERY DAY  . Mesalamine (DELZICOL) 400 MG CPDR DR capsule Take 2 capsules (800 mg total) by mouth 2 (two) times daily.  Marland Kitchen omeprazole (PRILOSEC) 20 MG capsule Take 1 capsule (20 mg total) by mouth daily.  . QUEtiapine (SEROQUEL) 100 MG tablet Take 1 tablet (100 mg total) by mouth at bedtime.  Marland Kitchen QUEtiapine (SEROQUEL) 25 MG tablet Take 1 tablet (25 mg total) by mouth at bedtime. To be combined with 100 mg  . spironolactone (ALDACTONE) 100 MG tablet TAKE 1 TABLET BY MOUTH EVERY DAY  . topiramate (TOPAMAX) 25 MG tablet Take 1 tablet (25 mg total) by mouth 2 (two) times daily.  . varenicline (CHANTIX STARTING MONTH PAK) 0.5 MG X 11 & 1 MG X 42 tablet Take one 0.5 mg tablet by mouth once daily for 3 days, then increase to one 0.5 mg tablet twice daily for 4 days, then  increase to one 1 mg tablet twice daily.  . [DISCONTINUED] venlafaxine XR (EFFEXOR XR) 75 MG 24 hr capsule Take 1 capsule (75 mg total) by mouth daily with breakfast.  . [START ON 01/20/2020] amphetamine-dextroamphetamine (ADDERALL) 20 MG tablet Take 1 tablet (20 mg total) by mouth 2 (two) times daily.  Derrill Memo ON 12/20/2019] amphetamine-dextroamphetamine (ADDERALL) 20 MG tablet Take 1 tablet (20 mg total) by mouth 2 (two) times daily.  Marland Kitchen venlafaxine XR (EFFEXOR XR) 150 MG 24 hr capsule Take 1 capsule (150 mg total) by mouth daily with breakfast.  . [DISCONTINUED] amphetamine-dextroamphetamine (ADDERALL) 20 MG tablet Take 1 tablet (20 mg total) by mouth 2 (two) times daily.  . [DISCONTINUED] Insulin Pen Needle (PEN  NEEDLES) 32G X 6 MM MISC 1 Units by Does not apply route daily. (Patient not taking: Reported on 12/18/2019)  . [DISCONTINUED] Liraglutide -Weight Management (SAXENDA) 18 MG/3ML SOPN Inject 0.6 mg into the skin daily for 7 days, THEN 1.2 mg daily for 7 days, THEN 1.8 mg daily for 7 days, THEN 2.4 mg daily for 7 days, THEN 3 mg daily. (Patient not taking: Reported on 12/18/2019)   No facility-administered encounter medications on file as of 12/18/2019.   Patient Active Problem List   Diagnosis Date Noted  . Pain in joints of both feet 12/18/2019  . Fatigue 12/18/2019  . Anxiety disorder 05/04/2019  . Cigarette smoker 04/19/2019  . Depression 05/12/2018  . Controlled substance agreement broken 01/30/2018  . 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 Insomnia 09/18/2017  . Ulcerative colitis ([place]) 09/15/2017  . Polycystic disease, ovaries 09/15/2017  . Myalgia 08/31/2017  . Polyarthralgia 08/31/2017  . Hydronephrosis, left 11/06/2016  . ADHD (attention deficit hyperactivity disorder) 04/16/2016  . Obesity (BMI 30-39.9) 04/16/2016   Past Medical History:  Diagnosis Date  . ADHD (attention deficit hyperactivity disorder)   . Anxiety and depression 09/18/2017  . Controlled substance agreement broken 01/30/2018  . History of stomach ulcers   . 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 Hydronephrosis, left 11/06/2016  . Myalgia   . Polyarthralgia 08/31/2017  . Polycystic disease, ovaries   . Pulmonary nodules 08/25/2017   right lung, repeat imaging in 1 year  . Ulcerative colitis ([place])    Relevant past medical, surgical, family and social history reviewed and updated as indicated. Interim medical history since our last visit reviewed.  Review of Systems  Constitutional: Positive for activity change and fatigue. Negative for chills, diaphoresis and fever.  Eyes: Positive for pain, redness and itching. Negative for visual disturbance.  Respiratory: Negative.  Negative for chest tightness, shortness of breath and  wheezing.   Cardiovascular: Negative.  Negative for chest pain and palpitations.  Gastrointestinal: Positive for abdominal pain. Negative for blood in stool, constipation, diarrhea, nausea and vomiting.  Musculoskeletal: Positive for arthralgias, back pain, gait problem, joint swelling, myalgias and neck pain. Negative for neck stiffness.  Skin: Negative.  Negative for color change, pallor and rash.  Neurological: Positive for weakness and numbness. Negative for dizziness, light-headedness and headaches.  Psychiatric/Behavioral: Positive for sleep disturbance. Negative for confusion. The patient is nervous/anxious.    Per HPI unless specifically indicated above     Objective:    BP 117/83 (BP Location: Left Arm, Patient Position: Sitting, Cuff Size: Normal)   Pulse 99   Temp 97.9 F (36.6 C) (Oral)   Ht 5' 4.5" (1.638 m)   Wt 196 lb (88.9 kg)   SpO2 97%   BMI 33.12 kg/m   Wt Readings from Last  3 Encounters:  12/18/19 196 lb (88.9 kg)  11/14/19 203 lb (92.1 kg)  11/02/19 202 lb 4 oz (91.7 kg)    Physical Exam Vitals and nursing note reviewed.  Constitutional:      General: She is not in acute distress.    Appearance: Normal appearance. She is normal weight. She is not ill-appearing or toxic-appearing.  Eyes:     General: No scleral icterus.    Extraocular Movements: Extraocular movements intact.     Pupils: Pupils are equal, round, and reactive to light.  Cardiovascular:     Rate and Rhythm: Normal rate.     Heart sounds: Normal heart sounds. No murmur.  Pulmonary:     Effort: Pulmonary effort is normal. No respiratory distress.  Abdominal:     General: Abdomen is flat. Bowel sounds are normal. There is no distension.     Palpations: Abdomen is soft.     Tenderness: There is no abdominal tenderness.  Musculoskeletal:        General: Swelling and tenderness present.     Right shoulder: Tenderness present. No swelling or effusion. Decreased range of motion.     Left  shoulder: Tenderness present. No swelling or effusion. Decreased range of motion.     Right wrist: Tenderness present. No swelling or effusion. Decreased range of motion.     Left wrist: Tenderness present. No swelling or effusion. Decreased range of motion.     Right hand: Tenderness present. No swelling. Decreased range of motion. Decreased strength. Normal capillary refill. Normal pulse.     Left hand: Tenderness present. No swelling. Decreased range of motion. Decreased strength. Normal capillary refill. Normal pulse.     Cervical back: Normal range of motion. Tenderness present.     Right lower leg: No edema.     Left lower leg: No edema.     Right ankle: No swelling. Normal range of motion.     Left ankle: No swelling. Normal range of motion.     Right foot: Decreased range of motion. Tenderness present. No swelling.     Left foot: Decreased range of motion. Tenderness present. No swelling.  Skin:    General: Skin is warm and dry.     Capillary Refill: Capillary refill takes less than 2 seconds.     Coloration: Skin is not jaundiced or pale.  Neurological:     Mental Status: She is alert and oriented to person, place, and time.     Motor: No weakness.     Gait: Gait normal.  Psychiatric:        Mood and Affect: Mood normal.        Behavior: Behavior normal.        Thought Content: Thought content normal.        Judgment: Judgment normal.       Assessment & Plan:   Problem List Items Addressed This Visit      Other   Pain in joints of both feet - Primary    Chronic, ongoing.  Given history and physical exam findings today, likely rheumatologic condition, will order testing for such.  Will check RA QN+CCP(IgG/A) and rheumatoid factor to check for RA.  CRP and ESR to check for inflammation.  Uric acid to check for gout.  ANA to check for SLE.  Will also check Vitamin D and CMP given long-standing pain and fatigue related to pain.  Referral to rheumatology placed.  Effexor dose  increased to 146m daily targeting both pain  and mood.      Relevant Medications   venlafaxine XR (EFFEXOR XR) 150 MG 24 hr capsule   Other Relevant Orders   RA Qn+CCP(IgG/A)+SjoSSA+SjoSSB   C-reactive protein   Sed Rate (ESR)   Rheumatoid factor   Uric acid   ANA w/Reflex if Positive   VITAMIN D 25 Hydroxy (Vit-D Deficiency, Fractures)   Comprehensive metabolic panel   UA/M w/rflx Culture, Routine   Fatigue    Chronic, ongoing.  Reports fatigue has been worse lately due to increase in pain.  Will check labs today. Lipids to check for CVD.  TSH to check for thyroid dysfunction.  CBC to check if anemic.  A1C to check for diabetes. UA to ensure no infectious process in urine.      Relevant Orders   Lipid Panel w/o Chol/HDL Ratio   TSH   CBC with Differential/Platelet   Bayer DCA Hb A1c Waived    Other Visit Diagnoses    Joint pain in fingers of both hands       Relevant Medications   venlafaxine XR (EFFEXOR XR) 150 MG 24 hr capsule   Other Relevant Orders   RA Qn+CCP(IgG/A)+SjoSSA+SjoSSB   C-reactive protein   Sed Rate (ESR)   Rheumatoid factor   Uric acid   ANA w/Reflex if Positive   VITAMIN D 25 Hydroxy (Vit-D Deficiency, Fractures)   Comprehensive metabolic panel   UA/M w/rflx Culture, Routine   Ambulatory referral to Rheumatology       Follow up plan: Return if symptoms worsen or fail to improve.

## 2019-12-18 NOTE — Assessment & Plan Note (Signed)
Chronic, ongoing.  Reports fatigue has been worse lately due to increase in pain.  Will check labs today. Lipids to check for CVD.  TSH to check for thyroid dysfunction.  CBC to check if anemic.  A1C to check for diabetes. UA to ensure no infectious process in urine.

## 2019-12-18 NOTE — Assessment & Plan Note (Addendum)
Chronic, ongoing.  Given history and physical exam findings today, likely rheumatologic condition, will order testing for such.  Will check RA QN+CCP(IgG/A) and rheumatoid factor to check for RA.  CRP and ESR to check for inflammation.  Uric acid to check for gout.  ANA to check for SLE.  Will also check Vitamin D and CMP given long-standing pain and fatigue related to pain.  Referral to rheumatology placed.  Effexor dose increased to 111m daily targeting both pain and mood.  Will send in work note to [employer]

## 2019-12-19 ENCOUNTER — Encounter: Payer: Self-pay | Admitting: Family Medicine

## 2019-12-20 ENCOUNTER — Encounter: Payer: Self-pay | Admitting: Family Medicine

## 2019-12-20 LAB — LIPID PANEL W/O CHOL/HDL RATIO
Cholesterol, Total: 240 mg/dL — ABNORMAL HIGH (ref 100–199)
HDL: 46 mg/dL (ref >39)
LDL Chol Calc (NIH): 168 mg/dL — ABNORMAL HIGH (ref 0–99)
Triglycerides: 144 mg/dL (ref 0–149)
VLDL Cholesterol Cal: 26 mg/dL (ref 5–40)

## 2019-12-20 LAB — CBC WITH DIFFERENTIAL/PLATELET
Basophils Absolute: 0 10*3/uL (ref 0.0–0.2)
Basos: 1 %
EOS (ABSOLUTE): 0.1 10*3/uL (ref 0.0–0.4)
Eos: 1 %
Hematocrit: 49.7 % — ABNORMAL HIGH (ref 34.0–46.6)
Hemoglobin: 16.8 g/dL — ABNORMAL HIGH (ref 11.1–15.9)
Immature Grans (Abs): 0 10*3/uL (ref 0.0–0.1)
Immature Granulocytes: 0 %
Lymphocytes Absolute: 2.7 10*3/uL (ref 0.7–3.1)
Lymphs: 37 %
MCH: 31.6 pg (ref 26.6–33.0)
MCHC: 33.8 g/dL (ref 31.5–35.7)
MCV: 94 fL (ref 79–97)
Monocytes Absolute: 0.6 10*3/uL (ref 0.1–0.9)
Monocytes: 8 %
Neutrophils Absolute: 3.9 10*3/uL (ref 1.4–7.0)
Neutrophils: 53 %
Platelets: 359 10*3/uL (ref 150–450)
RBC: 5.31 x10E6/uL — ABNORMAL HIGH (ref 3.77–5.28)
RDW: 12.4 % (ref 11.7–15.4)
WBC: 7.3 10*3/uL (ref 3.4–10.8)

## 2019-12-20 LAB — COMPREHENSIVE METABOLIC PANEL
ALT: 16 IU/L (ref 0–32)
AST: 17 IU/L (ref 0–40)
Albumin/Globulin Ratio: 1.9 (ref 1.2–2.2)
Albumin: 4.7 g/dL (ref 3.8–4.8)
Alkaline Phosphatase: 76 IU/L (ref 39–117)
BUN/Creatinine Ratio: 10 (ref 9–23)
BUN: 9 mg/dL (ref 6–20)
Bilirubin Total: 0.4 mg/dL (ref 0.0–1.2)
CO2: 20 mmol/L (ref 20–29)
Calcium: 9.8 mg/dL (ref 8.7–10.2)
Chloride: 101 mmol/L (ref 96–106)
Creatinine, Ser: 0.9 mg/dL (ref 0.57–1.00)
GFR calc Af Amer: 94 mL/min/{1.73_m2} (ref >59)
GFR calc non Af Amer: 81 mL/min/{1.73_m2} (ref >59)
Globulin, Total: 2.5 g/dL (ref 1.5–4.5)
Glucose: 83 mg/dL (ref 65–99)
Potassium: 3.9 mmol/L (ref 3.5–5.2)
Sodium: 136 mmol/L (ref 134–144)
Total Protein: 7.2 g/dL (ref 6.0–8.5)

## 2019-12-20 LAB — SEDIMENTATION RATE: Sed Rate: 15 mm/hr (ref 0–32)

## 2019-12-20 LAB — TSH: TSH: 1.89 u[IU]/mL (ref 0.450–4.500)

## 2019-12-20 LAB — RA QN+CCP(IGG/A)+SJOSSA+SJOSSB
Cyclic Citrullin Peptide Ab: 5 units (ref 0–19)
ENA SSA (RO) Ab: <0.2 AI (ref 0.0–0.9)
ENA SSB (LA) Ab: <0.2 AI (ref 0.0–0.9)
Rheumatoid fact SerPl-aCnc: <10 IU/mL (ref 0.0–13.9)

## 2019-12-20 LAB — ANA W/REFLEX IF POSITIVE: Anti Nuclear Antibody (ANA): NEGATIVE

## 2019-12-20 LAB — VITAMIN D 25 HYDROXY (VIT D DEFICIENCY, FRACTURES): Vit D, 25-Hydroxy: 15.5 ng/mL — ABNORMAL LOW (ref 30.0–100.0)

## 2019-12-20 LAB — C-REACTIVE PROTEIN: CRP: 4 mg/L (ref 0–10)

## 2019-12-20 LAB — URIC ACID: Uric Acid: 5.1 mg/dL (ref 2.6–6.2)

## 2019-12-20 NOTE — Progress Notes (Signed)
Office Visit Note  Patient: Tracy Whitaker             Date of Birth: 02-13-1981           MRN: [id number]             PCP: Volney American, PA-C Referring: Volney American,* Visit Date: 12/21/2019 Occupation: @GUAROCC @  Subjective:  New Patient (Initial Visit) (Joint pain)   History of Present Illness: Tracy Whitaker is a 39 y.o. female seen in consultation per request of her PCP.  According to patient her symptoms are started 6 years ago with blood in her stool and diarrhea.  At the time she was seen in urgent care and then was referred to a gastroenterologist at [facility].  She states she had colonoscopy and was diagnosed with ulcerative colitis.  She states her gastroenterologist placed her on mesalamine.  She could not go for follow-up visits due to financial reasons.  The medication was continued by her PCP.  She states she continues to have diarrhea and blood in her stool off and on.  She had blood in her stool for the last 2 days and has diarrhea about 3 times a day with meals.  She states at the same time she started having problems with plantar fasciitis which eventually got better by using orthotics and weight loss.  She also gives history of weak ankles which twist easily.  She states she has had fracture in her ankles in the past.  She been also having discomfort in her feet for last 20 years.  She states gradually had the symptoms of feet pain got worse and now the pain is constant in her feet, and her ankles and sometimes in her legs.  She states that she has been also experiencing some discomfort in her cervical spine, wrist joints, hands, ankles and her knees.  She has noticed swelling in her hands and her feet.  She was seen by a chiropractor in March 2020 for right upper extremity paresthesias at that time the x-ray of the cervical spine was consistent with degenerative disc disease of the spine per patient.  She is gravida 2 para 3 1 miscarriages 1.  There is  positive history of ulcerative colitis in her cousin.  Activities of Daily Living:  Patient reports morning stiffness for 24 hours.   Patient Reports nocturnal pain.  Difficulty dressing/grooming: Reports Difficulty climbing stairs: Reports Difficulty getting out of chair: Reports Difficulty using hands for taps, buttons, cutlery, and/or writing: Reports  Review of Systems  Constitutional: Positive for fatigue. Negative for night sweats, weight gain and weight loss.  HENT: Positive for mouth dryness. Negative for mouth sores, trouble swallowing, trouble swallowing and nose dryness.   Eyes: Positive for dryness. Negative for pain, redness and visual disturbance.  Respiratory: Negative for cough, shortness of breath and difficulty breathing.   Cardiovascular: Negative for chest pain, palpitations, hypertension, irregular heartbeat and swelling in legs/feet.  Gastrointestinal: Positive for blood in stool and diarrhea. Negative for constipation.  Endocrine: Negative for cold intolerance, heat intolerance, excessive thirst and increased urination.  Genitourinary: Negative for painful urination and vaginal dryness.  Musculoskeletal: Positive for arthralgias, gait problem, joint pain, joint swelling, muscle weakness, morning stiffness and muscle tenderness. Negative for myalgias and myalgias.  Skin: Negative for color change, rash, hair loss, skin tightness, ulcers and sensitivity to sunlight.  Allergic/Immunologic: Positive for susceptible to infections.  Neurological: Positive for numbness, headaches and weakness. Negative for dizziness, memory loss  and night sweats.  Hematological: Negative for bruising/bleeding tendency and swollen glands.  Psychiatric/Behavioral: Positive for depressed mood and sleep disturbance. The patient is nervous/anxious.     PMFS History:  Patient Active Problem List   Diagnosis Date Noted  . Pain in joints of both feet 12/18/2019  . Fatigue 12/18/2019  . Anxiety  disorder 05/04/2019  . Cigarette smoker 04/19/2019  . Depression 05/12/2018  . Controlled substance agreement broken 01/30/2018  . 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 Insomnia 09/18/2017  . Ulcerative colitis ([place]) 09/15/2017  . Polycystic disease, ovaries 09/15/2017  . Myalgia 08/31/2017  . Polyarthralgia 08/31/2017  . Hydronephrosis, left 11/06/2016  . ADHD (attention deficit hyperactivity disorder) 04/16/2016  . Obesity (BMI 30-39.9) 04/16/2016    Past Medical History:  Diagnosis Date  . ADHD (attention deficit hyperactivity disorder)   . Anxiety and depression 09/18/2017  . Controlled substance agreement broken 01/30/2018  . History of stomach ulcers   . 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 Hydronephrosis, left 11/06/2016  . Myalgia   . Plantar fasciitis   . Polyarthralgia 08/31/2017  . Polycystic disease, ovaries   . Pulmonary nodules 08/25/2017   right lung, repeat imaging in 1 year  . Ulcerative colitis ([place])     Family History  Problem Relation Age of Onset  . Kidney Stones Father   . Cancer Maternal Grandmother   . Heart disease Paternal Aunt   . Kidney cancer Neg Hx   . Kidney disease Neg Hx   . Prostate cancer Neg Hx    Past Surgical History:  Procedure Laterality Date  . CYSTOSCOPY WITH STENT PLACEMENT Left 11/06/2016   Procedure: CYSTOSCOPY WITH STENT PLACEMENT;  Surgeon: Nickie Retort, MD;  Location: [facility] ORS;  Service: Urology;  Laterality: Left;  . CYSTOSCOPY/URETEROSCOPY/HOLMIUM LASER/STENT PLACEMENT Left 11/27/2016   Procedure: CYSTOSCOPY/URETEROSCOPY/HOLMIUM LASER/STENT  rePLACEMENT;  Surgeon: Hollice Espy, MD;  Location: [facility] ORS;  Service: Urology;  Laterality: Left;  . Surgery for polycystic ovary     Social History   Social History Narrative  . Not on file   Immunization History  Administered Date(s) Administered  . DTaP 10/27/1981, 05/18/1982, 09/28/1982, 10/11/1983, 07/11/1986  . Hepatitis B 05/01/2012  . IPV 10/27/1981, 05/18/1982, 09/28/1982,  10/11/1983, 07/11/1986  . Influenza,inj,Quad PF,6+ Mos 11/09/2016, 10/21/2017  . Influenza-Unspecified 08/30/2018, 09/05/2019  . MMR 11/30/1982, 05/01/2012  . Pneumococcal Polysaccharide-23 11/09/2016  . Td 08/31/2019  . Tdap 04/10/2008     Objective: Vital Signs: BP 105/71 (BP Location: Right Arm, Patient Position: Sitting, Cuff Size: Normal)   Pulse 82   Resp 18   Ht 5' 4.5" (1.638 m)   Wt 199 lb 9.6 oz (90.5 kg)   BMI 33.73 kg/m    Physical Exam Vitals and nursing note reviewed.  Constitutional:      Appearance: She is well-developed.  HENT:     Head: Normocephalic and atraumatic.  Eyes:     Conjunctiva/sclera: Conjunctivae normal.  Cardiovascular:     Rate and Rhythm: Normal rate and regular rhythm.     Heart sounds: Normal heart sounds.  Pulmonary:     Effort: Pulmonary effort is normal.     Breath sounds: Normal breath sounds.  Abdominal:     General: Bowel sounds are normal.     Palpations: Abdomen is soft.  Musculoskeletal:     Cervical back: Normal range of motion.  Lymphadenopathy:     Cervical: No cervical adenopathy.  Skin:    General: Skin is warm and dry.     Capillary  Refill: Capillary refill takes less than 2 seconds.  Neurological:     Mental Status: She is alert and oriented to person, place, and time.  Psychiatric:        Behavior: Behavior normal.      Musculoskeletal Exam: Patient has painful range of motion of her cervical spine thoracic and lumbar spine.  She had tenderness on palpation over SI joint and coccyx region.  Shoulder joints, elbow joints, wrist joints, MCPs PIPs and DIPs with good range of motion with no synovitis.  She has mild thickening of PIP DIP and CMC joints.  Hip joints, knee joints, ankles, MTPs and PIPs with good range of motion with no synovitis.  She had tenderness over palpation of her bilateral plantar fascia.  CDAI Exam: CDAI Score: -- Patient Global: --; Provider Global: -- Swollen: --; Tender: -- Joint Exam  12/21/2019   No joint exam has been documented for this visit   There is currently no information documented on the homunculus. Go to the Rheumatology activity and complete the homunculus joint exam.  Investigation: No additional findings.  Imaging: No results found.  Recent Labs: Lab Results  Component Value Date   WBC 7.3 12/18/2019   HGB 16.8 (H) 12/18/2019   PLT 359 12/18/2019   NA 136 12/18/2019   K 3.9 12/18/2019   CL 101 12/18/2019   CO2 20 12/18/2019   GLUCOSE 83 12/18/2019   BUN 9 12/18/2019   CREATININE 0.90 12/18/2019   BILITOT 0.4 12/18/2019   ALKPHOS 76 12/18/2019   AST 17 12/18/2019   ALT 16 12/18/2019   PROT 7.2 12/18/2019   ALBUMIN 4.7 12/18/2019   CALCIUM 9.8 12/18/2019   GFRAA 94 12/18/2019   And December 18, 2019 RF negative, anti-CCP negative, ANA negative, Ro negative, La negative, uric acid 5.1, TSH normal, ESR 15,   vitamin D 15.5  Speciality Comments: No specialty comments available.  Procedures:  No procedures performed Allergies: Latex and Sulfamethoxazole-trimethoprim   Assessment / Plan:     Visit Diagnoses: Pain in both hands -she complains of pain and discomfort in her bilateral hands and intermittent swelling.  I do not see any synovitis on the examination.  Her sed rate is normal.  12/18/19: ANA-, RF<10, Ro-, La-, CCP 5, CRP 4, ESR 15, uric acid 5.1, TSH 1.89 - Plan: XR Hand 2 View Right, XR Hand 2 View Left.  X-ray of bilateral hands were unremarkable.  Chronic SI joint pain -she has some tenderness over SI joint area she also has tenderness over her coccyx.  She describes this pain has been worse for her.  Plan: XR Pelvis 1-2 Views.  X-ray of the pelvis was unremarkable.  I will schedule MRI of the SI joints to look for sacroiliitis.  Her sedimentation rate was normal.  Pain in both feet -she has pain in her bilateral feet.  She had tenderness over plantar fascia.  She also gives history of frequent falls due to hypermobility in her  ankles.  She states she has had a fracture of her ankles in the past.  Plan: XR Foot 2 Views Right, XR Foot 2 Views Left.  The x-ray showed osteoarthritic changes and calcaneal spurs.  Plantar fasciitis, bilateral-she had tenderness over bilateral plantar fascia.  She states she has history of recurrent plantar fasciitis for the last 6 years.  She has had cortisone injections in the past.  Neck pain-patient has been having stiffness and discomfort in her cervical spine.  The x-ray today showed  mild degenerative changes between C5-C6.  Polyarthralgia-she complains of pain in almost all of her joints.  Myalgia-she also has been experiencing a lot of muscle pain.  Ulcerative colitis with complication, unspecified location (HCC)-patient states she was diagnosed with ulcerative colitis at [facility] about 6 years ago after colonoscopy.  She was placed on mesalamine which was continued.  She could not go back to [facility] due to financial reasons.  She continues to have diarrhea about 3 times a day and also having some blood in her stool.  We will try to make a urgent referral to gastroenterology.  Vitamin D deficiency - 12/18/19: Vitamin D was 15.5  Anxiety and depression-she states she has history of anxiety and depression and has been taking medications.  She cried throughout the conversation, exam and discussion.  I discussed the need of having an appointment with gastroenterologist as soon as possible and to keep that appointment.  Attention deficit hyperactivity disorder (ADHD), unspecified ADHD type-treated by her PCP.  Pulmonary nodules-she states she was found to have pulmonary nodules several years ago and there was no follow-up on it.  I have advised her to discuss that further with her PCP.  Cigarette smoker - 1/2 ppd, on Chantix now. 1ppdx20 years.  Smoking cessation was discussed.  Polycystic disease, ovaries  Hydronephrosis, left-related to the kidney stones per patient.  History of sepsis -  2018 with kidney stones.  Controlled substance agreement broken - adderall, per patient.  Orders: Orders Placed This Encounter  Procedures  . XR Hand 2 View Right  . XR Hand 2 View Left  . XR Foot 2 Views Right  . XR Foot 2 Views Left  . XR Pelvis 1-2 Views   No orders of the defined types were placed in this encounter.   Face-to-face time spent with patient was 50 minutes. Greater than 50% of time was spent in counseling and coordination of care.  Follow-Up Instructions: Return for Ulcerative colitis, polyarthralgia.   Bo Merino, MD  Note - This record has been created using Editor, commissioning.  Chart creation errors have been sought, but may not always  have been located. Such creation errors do not reflect on  the standard of medical care.

## 2019-12-21 ENCOUNTER — Encounter: Payer: Self-pay | Admitting: Physician Assistant

## 2019-12-21 ENCOUNTER — Ambulatory Visit: Payer: Self-pay

## 2019-12-21 ENCOUNTER — Ambulatory Visit: Payer: 59 | Admitting: Rheumatology

## 2019-12-21 ENCOUNTER — Other Ambulatory Visit: Payer: Self-pay

## 2019-12-21 ENCOUNTER — Encounter: Payer: Self-pay | Admitting: Rheumatology

## 2019-12-21 VITALS — BP 105/71 | HR 82 | Resp 18 | Ht 64.5 in | Wt 199.6 lb

## 2019-12-21 DIAGNOSIS — M79642 Pain in left hand: Secondary | ICD-10-CM

## 2019-12-21 DIAGNOSIS — M791 Myalgia, unspecified site: Secondary | ICD-10-CM

## 2019-12-21 DIAGNOSIS — M79671 Pain in right foot: Secondary | ICD-10-CM

## 2019-12-21 DIAGNOSIS — M79641 Pain in right hand: Secondary | ICD-10-CM | POA: Diagnosis not present

## 2019-12-21 DIAGNOSIS — M2550 Pain in unspecified joint: Secondary | ICD-10-CM

## 2019-12-21 DIAGNOSIS — M722 Plantar fascial fibromatosis: Secondary | ICD-10-CM

## 2019-12-21 DIAGNOSIS — M533 Sacrococcygeal disorders, not elsewhere classified: Secondary | ICD-10-CM

## 2019-12-21 DIAGNOSIS — R918 Other nonspecific abnormal finding of lung field: Secondary | ICD-10-CM

## 2019-12-21 DIAGNOSIS — F329 Major depressive disorder, single episode, unspecified: Secondary | ICD-10-CM

## 2019-12-21 DIAGNOSIS — E282 Polycystic ovarian syndrome: Secondary | ICD-10-CM

## 2019-12-21 DIAGNOSIS — M79672 Pain in left foot: Secondary | ICD-10-CM

## 2019-12-21 DIAGNOSIS — E559 Vitamin D deficiency, unspecified: Secondary | ICD-10-CM

## 2019-12-21 DIAGNOSIS — K51919 Ulcerative colitis, unspecified with unspecified complications: Secondary | ICD-10-CM

## 2019-12-21 DIAGNOSIS — M542 Cervicalgia: Secondary | ICD-10-CM

## 2019-12-21 DIAGNOSIS — Z91148 Patient's other noncompliance with medication regimen for other reason: Secondary | ICD-10-CM

## 2019-12-21 DIAGNOSIS — G8929 Other chronic pain: Secondary | ICD-10-CM

## 2019-12-21 DIAGNOSIS — N1330 Unspecified hydronephrosis: Secondary | ICD-10-CM

## 2019-12-21 DIAGNOSIS — F909 Attention-deficit hyperactivity disorder, unspecified type: Secondary | ICD-10-CM

## 2019-12-21 DIAGNOSIS — Z8619 Personal history of other infectious and parasitic diseases: Secondary | ICD-10-CM

## 2019-12-21 DIAGNOSIS — F32A Depression, unspecified: Secondary | ICD-10-CM

## 2019-12-21 DIAGNOSIS — F17210 Nicotine dependence, cigarettes, uncomplicated: Secondary | ICD-10-CM

## 2019-12-21 DIAGNOSIS — Z9114 Patient's other noncompliance with medication regimen: Secondary | ICD-10-CM

## 2019-12-21 DIAGNOSIS — F419 Anxiety disorder, unspecified: Secondary | ICD-10-CM

## 2019-12-24 ENCOUNTER — Encounter: Payer: Self-pay | Admitting: Rheumatology

## 2019-12-24 ENCOUNTER — Encounter: Payer: Self-pay | Admitting: Family Medicine

## 2019-12-25 ENCOUNTER — Other Ambulatory Visit: Payer: Self-pay | Admitting: Family Medicine

## 2019-12-25 ENCOUNTER — Encounter: Payer: Self-pay | Admitting: Family Medicine

## 2019-12-25 ENCOUNTER — Telehealth: Payer: Self-pay | Admitting: Nurse Practitioner

## 2019-12-25 DIAGNOSIS — M5030 Other cervical disc degeneration, unspecified cervical region: Secondary | ICD-10-CM

## 2019-12-25 DIAGNOSIS — E559 Vitamin D deficiency, unspecified: Secondary | ICD-10-CM

## 2019-12-25 MED ORDER — CHOLECALCIFEROL 1.25 MG (50000 UT) PO TABS: 1.0000 | ORAL_TABLET | ORAL | 0 refills | Status: DC

## 2019-12-25 NOTE — Telephone Encounter (Signed)
See previous encounter

## 2019-12-26 ENCOUNTER — Other Ambulatory Visit: Payer: Self-pay

## 2019-12-26 ENCOUNTER — Encounter: Payer: Self-pay | Admitting: Nurse Practitioner

## 2019-12-26 ENCOUNTER — Ambulatory Visit (INDEPENDENT_AMBULATORY_CARE_PROVIDER_SITE_OTHER): Payer: 59 | Admitting: Nurse Practitioner

## 2019-12-26 ENCOUNTER — Other Ambulatory Visit (INDEPENDENT_AMBULATORY_CARE_PROVIDER_SITE_OTHER): Payer: 59

## 2019-12-26 VITALS — BP 120/70 | HR 106 | Temp 98.2°F | Ht 64.0 in | Wt 204.0 lb

## 2019-12-26 DIAGNOSIS — K51919 Ulcerative colitis, unspecified with unspecified complications: Secondary | ICD-10-CM

## 2019-12-26 DIAGNOSIS — Z01818 Encounter for other preprocedural examination: Secondary | ICD-10-CM | POA: Diagnosis not present

## 2019-12-26 LAB — C-REACTIVE PROTEIN: CRP: <1 mg/dL (ref 0.5–20.0)

## 2019-12-26 LAB — SEDIMENTATION RATE: Sed Rate: 7 mm/hr (ref 0–20)

## 2019-12-26 MED ORDER — NA SULFATE-K SULFATE-MG SULF 17.5-3.13-1.6 GM/177ML PO SOLN: 1.0000 | Freq: Once | ORAL | 0 refills | Status: AC

## 2019-12-26 MED ORDER — PREDNISONE 10 MG PO TABS: 40.0000 mg | ORAL_TABLET | Freq: Every day | ORAL | 0 refills | Status: DC

## 2019-12-26 NOTE — Progress Notes (Signed)
ASSESSMENT / PLAN:    39 year old female with reported history of ulcerative colitis diagnosed in 2013 [facility], Dr. Tracey Harries).  Since followed by PCP and maintained on Delzicol.  Has done relatively well except now presenting with blood in stool (in absence of in other bowel changes) and generalized joint aches. --CRP, ESR. --She is crying over the low back and hip pain. No acute findings on MRI of cervical , thoracic, lumbar spine. Arthralgia possibly secondary to UC flare. It has been several years since her colonoscopy in 2013.  She needs repeat exam staging and further evaluation of current symptoms. I found her an appointment for next Tuesday. The risks and benefits of colonoscopy with possible polypectomy / biopsies were discussed and the patient agrees to proceed.  --In the interim will start Prednisone 40 mg daily (stay on this dose until colonoscopy).    HPI:    Referring Provider:     Volney American, PA-C Reason for referral:    Ulcerative colitis  Chief Complaint:   Joint aches/ulcerative colitis  Patient is a 39 year old female with ADHD, DDD who gives a history of ulcerative colitis diagnosed by Dr. Tracey Harries with [facility] in 2013.  At the time of diagnosis she was reportedly hospitalized for 4 days, started on prednisone.  She has not followed up with GI since but has been managed by PCP.  She has been on Delzicol to twice daily since diagnosis.  Over the years she has done well on Delzicol generally has loose stool but no more than 3 a day.  A month or so ago she developed generalized joint pain.  Pain started in her feet/ankles then began to involve her hands, hips and low back.  Complains of twitching in her hands. Last week she began seeing blood in her stools.  The frequency / consistency of of BMs hasn't not really changed, just the blood is new. No fevers.   I reviewed records in Care Everywhere.  Patient was actually evaluated by [facility] rheumatology in 2018 for  polyarthragias while taking Bactrim.  At the time of that visit her symptoms had already resolved.    Ryonna was seen at [facility] emergency department in [place] on 12/24/2019 and prescribed Robaxin. Labs / imaging as below  Data Reviewed:   ED 12/24/19 CBC  normal.   Albumin was slightly low at 3.4, total protein low at 5.7, comprehensive metabolic profile otherwise fairly unremarkable.  Urine pregnancy test negative   MRI of cervical, thoracic and lumbar spine.-No acute abnormalities . Multilevel degenerative changes of the cervical, thoracic and lumbar spine with varying degrees of canal stenosis and foraminal narrowing   Past Medical History:  Diagnosis Date  . ADHD (attention deficit hyperactivity disorder)   . Anxiety and depression 09/18/2017  . Controlled substance agreement broken 01/30/2018  . DDD (degenerative disc disease), cervical    and lumbar, mid back  . History of stomach ulcers   . 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 Hydronephrosis, left 11/06/2016  . Myalgia   . Plantar fasciitis   . Polyarthralgia 08/31/2017  . Polycystic disease, ovaries   . Pulmonary nodules 08/25/2017   right lung, repeat imaging in 1 year  . Ulcerative colitis [facility])      Past Surgical History:  Procedure Laterality Date  . CYSTOSCOPY WITH STENT PLACEMENT Left 11/06/2016   Procedure: CYSTOSCOPY WITH STENT PLACEMENT;  Surgeon: Nickie Retort, MD;  Location: [facility] ORS;  Service: Urology;  Laterality: Left;  . CYSTOSCOPY/URETEROSCOPY/HOLMIUM LASER/STENT PLACEMENT Left 11/27/2016   Procedure: CYSTOSCOPY/URETEROSCOPY/HOLMIUM LASER/STENT  rePLACEMENT;  Surgeon: Hollice Espy, MD;  Location: [facility] ORS;  Service: Urology;  Laterality: Left;  . PILONIDAL CYST EXCISION  2001  . PILONIDAL CYST EXCISION     several times   Family History  Problem Relation Age of Onset  . Kidney Stones Father   . Cancer Maternal Grandmother   . Heart disease Paternal Aunt   . Kidney cancer Neg Hx   . Kidney disease Neg  Hx   . Prostate cancer Neg Hx   . Colon cancer Neg Hx   . Esophageal cancer Neg Hx   . Rectal cancer Neg Hx    Social History   Tobacco Use  . Smoking status: Current Every Day Smoker    Packs/day: 0.50    Years: 20.00    Pack years: 10.00    Types: Cigarettes  . Smokeless tobacco: Never Used  Substance Use Topics  . Alcohol use: Yes    Comment: "once every couple of weeks"  . Drug use: No   Current Outpatient Medications  Medication Sig Dispense Refill  . acyclovir (ZOVIRAX) 800 MG tablet Take 1 tablet (800 mg total) by mouth 2 (two) times daily. 180 tablet 1  . acyclovir ointment (ZOVIRAX) 5 % Apply 1 application topically every 3 (three) hours. 30 g 3  . [START ON 01/20/2020] amphetamine-dextroamphetamine (ADDERALL) 20 MG tablet Take 1 tablet (20 mg total) by mouth 2 (two) times daily. 60 tablet 0  . Cholecalciferol 1.25 MG (50000 UT) TABS Take 1 tablet by mouth once a week. For 8 weeks and then stop.  Return to office for lab draw. 8 tablet 0  . CRYSELLE-28 0.3-30 MG-MCG tablet TAKE 1 TABLET BY MOUTH EVERY DAY 84 tablet 1  . Mesalamine (DELZICOL) 400 MG CPDR DR capsule Take 2 capsules (800 mg total) by mouth 2 (two) times daily. 180 capsule 1  . methocarbamol (ROBAXIN) 500 MG tablet Take by mouth.    Marland Kitchen omeprazole (PRILOSEC) 20 MG capsule Take 1 capsule (20 mg total) by mouth daily. 30 capsule 2  . QUEtiapine (SEROQUEL) 100 MG tablet Take 1 tablet (100 mg total) by mouth at bedtime. 90 tablet 1  . QUEtiapine (SEROQUEL) 25 MG tablet Take 1 tablet (25 mg total) by mouth at bedtime. To be combined with 100 mg 90 tablet 1  . spironolactone (ALDACTONE) 100 MG tablet TAKE 1 TABLET BY MOUTH EVERY DAY 90 tablet 1  . venlafaxine XR (EFFEXOR XR) 150 MG 24 hr capsule Take 1 capsule (150 mg total) by mouth daily with breakfast. 90 capsule 0  . Na Sulfate-K Sulfate-Mg Sulf 17.5-3.13-1.6 GM/177ML SOLN Take 1 kit by mouth once for 1 dose. 354 mL 0  . predniSONE (DELTASONE) 10 MG tablet Take 4  tablets (40 mg total) by mouth daily with breakfast. 100 tablet 0   No current facility-administered medications for this visit.   Allergies  Allergen Reactions  . Latex Dermatitis  . Sulfamethoxazole-Trimethoprim Other (See Comments)    Joint pain     Review of Systems: All systems reviewed and negative except where noted in HPI.   Serum creatinine: 0.9 mg/dL 12/18/19 1403 Estimated creatinine clearance: 93.4 mL/min   Physical Exam:    Wt Readings from Last 3 Encounters:  12/26/19 204 lb (92.5 kg)  12/21/19 199 lb 9.6 oz (90.5 kg)  12/18/19 196 lb (88.9 kg)    BP  120/70   Pulse (!) 106   Temp 98.2 F (36.8 C)   Ht _0  (1.626 m)   Wt 204 lb (92.5 kg)   BMI 35.02 kg/m  Constitutional:  Pleasant female in no acute distress. Psychiatric: Normal mood and affect. Behavior is normal. EENT: Pupils normal.  Conjunctivae are normal. No scleral icterus. Neck supple.  Cardiovascular: Normal rate, regular rhythm. No edema Pulmonary/chest: Effort normal and breath sounds normal. No wheezing, rales or rhonchi. Abdominal: Soft, nondistended, nontender. Bowel sounds active throughout. There are no masses palpable. No hepatomegaly. Neurological: Alert and oriented to person place and time. Skin: Skin is warm and dry. No rashes noted.  Tye Savoy, NP  12/26/2019, 11:38 AM  Cc: Volney American,*

## 2019-12-26 NOTE — Patient Instructions (Signed)
If you are age 39 or older, your body mass index should be between 23-30. Your Body mass index is 35.02 kg/m. If this is out of the aforementioned range listed, please consider follow up with your Primary Care Provider.  If you are age 17 or younger, your body mass index should be between 19-25. Your Body mass index is 35.02 kg/m. If this is out of the aformentioned range listed, please consider follow up with your Primary Care Provider.   Your provider has requested that you go to the basement level for lab work before leaving today. Press "B" on the elevator. The lab is located at the first door on the left as you exit the elevator.  We have sent the following medications to your pharmacy for you to pick up at your convenience: Prednisone 40 mg daily until procedure  You have been scheduled for a colonoscopy. Please follow written instructions given to you at your visit today.  Please pick up your prep supplies at the pharmacy within the next 1-3 days. If you use inhalers (even only as needed), please bring them with you on the day of your procedure.

## 2019-12-26 NOTE — Addendum Note (Signed)
Addended by: Yetta Flock on: 12/26/2019 12:11 PM   Modules accepted: Level of Service

## 2019-12-26 NOTE — Progress Notes (Signed)
Agree with assessment and plan as outlined.  

## 2019-12-27 ENCOUNTER — Encounter: Payer: Self-pay | Admitting: Gastroenterology

## 2019-12-27 ENCOUNTER — Ambulatory Visit: Payer: 59 | Admitting: Physician Assistant

## 2019-12-28 ENCOUNTER — Ambulatory Visit: Payer: 59 | Admitting: Family Medicine

## 2019-12-28 ENCOUNTER — Ambulatory Visit (INDEPENDENT_AMBULATORY_CARE_PROVIDER_SITE_OTHER): Payer: 59

## 2019-12-28 DIAGNOSIS — Z1159 Encounter for screening for other viral diseases: Secondary | ICD-10-CM

## 2019-12-29 LAB — SARS CORONAVIRUS 2 (TAT 6-24 HRS): SARS Coronavirus 2: NEGATIVE

## 2019-12-31 ENCOUNTER — Encounter: Payer: Self-pay | Admitting: Family Medicine

## 2019-12-31 ENCOUNTER — Encounter: Payer: Self-pay | Admitting: Rheumatology

## 2020-01-01 ENCOUNTER — Encounter: Payer: Self-pay | Admitting: Gastroenterology

## 2020-01-01 ENCOUNTER — Other Ambulatory Visit: Payer: Self-pay | Admitting: Family Medicine

## 2020-01-01 ENCOUNTER — Ambulatory Visit (AMBULATORY_SURGERY_CENTER): Payer: 59 | Admitting: Gastroenterology

## 2020-01-01 ENCOUNTER — Other Ambulatory Visit: Payer: Self-pay

## 2020-01-01 VITALS — BP 111/68 | HR 68 | Temp 96.8°F | Resp 15 | Ht 64.0 in | Wt 204.0 lb

## 2020-01-01 DIAGNOSIS — K51919 Ulcerative colitis, unspecified with unspecified complications: Secondary | ICD-10-CM

## 2020-01-01 DIAGNOSIS — D123 Benign neoplasm of transverse colon: Secondary | ICD-10-CM | POA: Diagnosis not present

## 2020-01-01 DIAGNOSIS — D125 Benign neoplasm of sigmoid colon: Secondary | ICD-10-CM

## 2020-01-01 DIAGNOSIS — K648 Other hemorrhoids: Secondary | ICD-10-CM

## 2020-01-01 MED ORDER — METHOCARBAMOL 500 MG PO TABS: 500.0000 mg | ORAL_TABLET | Freq: Three times a day (TID) | ORAL | 0 refills | Status: AC | PRN

## 2020-01-01 MED ORDER — SODIUM CHLORIDE 0.9 % IV SOLN: 500.0000 mL | Freq: Once | INTRAVENOUS | Status: DC

## 2020-01-01 NOTE — Op Note (Signed)
[facility] Patient Name: Tracy Whitaker Procedure Date: 01/01/2020 3:09 PM MRN: [id number] Endoscopist: Remo Lipps P. Havery Moros , MD Age: 39 Referring MD:  Date of Birth: Aug 15, 1981 Gender: Female Account #: [id number] Procedure:                Colonoscopy Indications:              Follow-up of ulcerative colitis (dx at [facility] 2013 on                            Delzicol), recent reported flare of symptoms on                            Delzicol and given prednisone with improvement of                            bowel symptoms, also with joint pains followed by                            Rheumatology Medicines:                Monitored Anesthesia Care Procedure:                Pre-Anesthesia Assessment:                           - Prior to the procedure, a History and Physical                            was performed, and patient medications and                            allergies were reviewed. The patient's tolerance of                            previous anesthesia was also reviewed. The risks                            and benefits of the procedure and the sedation                            options and risks were discussed with the patient.                            All questions were answered, and informed consent                            was obtained. Prior Anticoagulants: The patient has                            taken no previous anticoagulant or antiplatelet                            agents. ASA Grade Assessment: II - A patient with  mild systemic disease. After reviewing the risks                            and benefits, the patient was deemed in                            satisfactory condition to undergo the procedure.                           After obtaining informed consent, the colonoscope                            was passed under direct vision. Throughout the                            procedure, the patient's blood pressure,  pulse, and                            oxygen saturations were monitored continuously. The                            Colonoscope was introduced through the anus and                            advanced to the the terminal ileum, with                            identification of the appendiceal orifice and IC                            valve. The colonoscopy was performed without                            difficulty. The patient tolerated the procedure                            well. The quality of the bowel preparation was                            fair. The terminal ileum, ileocecal valve,                            appendiceal orifice, and rectum were photographed. Scope In: 3:18:44 PM Scope Out: 3:41:18 PM Scope Withdrawal Time: 0 hours 13 minutes 25 seconds  Total Procedure Duration: 0 hours 22 minutes 34 seconds  Findings:                 The perianal and digital rectal examinations were                            normal.                           The terminal ileum appeared normal.  A 6 mm polyp was found in the transverse colon. The                            polyp was sessile. The polyp was removed with a                            cold snare. Resection and retrieval were complete.                           A 4 mm polyp was found in the sigmoid colon. The                            polyp was sessile. The polyp was removed with a                            cold snare. Resection and retrieval were complete.                           Internal hemorrhoids were found during retroflexion.                           There was significant residual stool throughout the                            colon. The cecal cap could not be cleared, and                            right colon most affected. No obvious other polyps                            noted. The exam was otherwise without abnormality.                            No inflammatory changes.                            Biopsies were taken with a cold forceps from the                            right, transverse, and left colon. These biopsy                            specimens were sent to Pathology. Complications:            No immediate complications. Estimated blood loss:                            Minimal. Estimated Blood Loss:     Estimated blood loss was minimal. Impression:               - Preparation of the colon was fair, could not                            clear  the cecum.                           - The examined portion of the ileum was normal.                           - One 6 mm polyp in the transverse colon, removed                            with a cold snare. Resected and retrieved.                           - One 4 mm polyp in the sigmoid colon, removed with                            a cold snare. Resected and retrieved.                           - Internal hemorrhoids.                           - The examination was otherwise normal. No obvious                            inflammation.                           - Biopsies for surveillance were taken. Recommendation:           - Patient has a contact number available for                            emergencies. The signs and symptoms of potential                            delayed complications were discussed with the                            patient. Return to normal activities tomorrow.                            Written discharge instructions were provided to the                            patient.                           - Resume previous diet.                           - Continue present medications.                           - Await pathology results.                           - No active  colitis on this exam, unclear if the                            patient truly had colitis flare (labs normal),                            would wean prednisone as she tolerates. Decrease to                            71m / day for 3  days, then 234m/ day for 1 week,                            and then decrease by 38m68m week until done [employer], MD 01/01/2020 3:53:44 PM This report has been signed electronically.

## 2020-01-01 NOTE — Patient Instructions (Signed)
YOU HAD AN ENDOSCOPIC PROCEDURE TODAY AT [facility]:   Refer to the procedure report that was given to you for any specific questions about what was found during the examination.  If the procedure report does not answer your questions, please call your gastroenterologist to clarify.  If you requested that your care partner not be given the details of your procedure findings, then the procedure report has been included in a sealed envelope for you to review at your convenience later.  YOU SHOULD EXPECT: Some feelings of bloating in the abdomen. Passage of more gas than usual.  Walking can help get rid of the air that was put into your GI tract during the procedure and reduce the bloating. If you had a lower endoscopy (such as a colonoscopy or flexible sigmoidoscopy) you may notice spotting of blood in your stool or on the toilet paper. If you underwent a bowel prep for your procedure, you may not have a normal bowel movement for a few days.  Please Note:  You might notice some irritation and congestion in your nose or some drainage.  This is from the oxygen used during your procedure.  There is no need for concern and it should clear up in a day or so.  SYMPTOMS TO REPORT IMMEDIATELY:   Following lower endoscopy (colonoscopy or flexible sigmoidoscopy):  Excessive amounts of blood in the stool  Significant tenderness or worsening of abdominal pains  Swelling of the abdomen that is new, acute  Fever of 100F or higher   Following upper endoscopy (EGD)  Vomiting of blood or coffee ground material  New chest pain or pain under the shoulder blades  Painful or persistently difficult swallowing  New shortness of breath  Fever of 100F or higher  Black, tarry-looking stools  For urgent or emergent issues, a gastroenterologist can be reached at any hour by calling [phone].   DIET:  We do recommend a small meal at first, but then you may proceed to your regular diet.  Drink  plenty of fluids but you should avoid alcoholic beverages for 24 hours.  ACTIVITY:  You should plan to take it easy for the rest of today and you should NOT DRIVE or use heavy machinery until tomorrow (because of the sedation medicines used during the test).    FOLLOW UP: Our staff will call the number listed on your records 48-72 hours following your procedure to check on you and address any questions or concerns that you may have regarding the information given to you following your procedure. If we do not reach you, we will leave a message.  We will attempt to reach you two times.  During this call, we will ask if you have developed any symptoms of COVID 19. If you develop any symptoms (ie: fever, flu-like symptoms, shortness of breath, cough etc.) before then, please call [phone].  If you test positive for Covid 19 in the 2 weeks post procedure, please call and report this information to [place].    If any biopsies were taken you will be contacted by phone or by letter within the next 1-3 weeks.  Please call us at [phone] if you have not heard about the biopsies in 3 weeks.    SIGNATURES/CONFIDENTIALITY: You and/or your care partner have signed paperwork which will be entered into your electronic medical record.  These signatures attest to the fact that that the information above on your After Visit Summary has been reviewed and is  understood.  Full responsibility of the confidentiality of this discharge information lies with you and/or your care-partner.YOU HAD AN ENDOSCOPIC PROCEDURE TODAY AT [facility]:   Refer to the procedure report that was given to you for any specific questions about what was found during the examination.  If the procedure report does not answer your questions, please call your gastroenterologist to clarify.  If you requested that your care partner not be given the details of your procedure findings, then the procedure report has been included in a  sealed envelope for you to review at your convenience later.  YOU SHOULD EXPECT: Some feelings of bloating in the abdomen. Passage of more gas than usual.  Walking can help get rid of the air that was put into your GI tract during the procedure and reduce the bloating. If you had a lower endoscopy (such as a colonoscopy or flexible sigmoidoscopy) you may notice spotting of blood in your stool or on the toilet paper. If you underwent a bowel prep for your procedure, you may not have a normal bowel movement for a few days.  Please Note:  You might notice some irritation and congestion in your nose or some drainage.  This is from the oxygen used during your procedure.  There is no need for concern and it should clear up in a day or so.  SYMPTOMS TO REPORT IMMEDIATELY:   Following lower endoscopy (colonoscopy or flexible sigmoidoscopy):  Excessive amounts of blood in the stool  Significant tenderness or worsening of abdominal pains  Swelling of the abdomen that is new, acute  Fever of 100F or higher  For urgent or emergent issues, a gastroenterologist can be reached at any hour by calling [phone].   DIET:  We do recommend a small meal at first, but then you may proceed to your regular diet.  Drink plenty of fluids but you should avoid alcoholic beverages for 24 hours.  ACTIVITY:  You should plan to take it easy for the rest of today and you should NOT DRIVE or use heavy machinery until tomorrow (because of the sedation medicines used during the test).    FOLLOW UP: Our staff will call the number listed on your records 48-72 hours following your procedure to check on you and address any questions or concerns that you may have regarding the information given to you following your procedure. If we do not reach you, we will leave a message.  We will attempt to reach you two times.  During this call, we will ask if you have developed any symptoms of COVID 19. If you develop any symptoms (ie:  fever, flu-like symptoms, shortness of breath, cough etc.) before then, please call [phone].  If you test positive for Covid 19 in the 2 weeks post procedure, please call and report this information to [place].    If any biopsies were taken you will be contacted by phone or by letter within the next 1-3 weeks.  Please call us at [phone] if you have not heard about the biopsies in 3 weeks.    SIGNATURES/CONFIDENTIALITY: You and/or your care partner have signed paperwork which will be entered into your electronic medical record.  These signatures attest to the fact that that the information above on your After Visit Summary has been reviewed and is understood.  Full responsibility of the confidentiality of this discharge information lies with you and/or your care-partner.

## 2020-01-01 NOTE — Progress Notes (Signed)
Report to PACU, RN, vss, BBS= Clear.  

## 2020-01-01 NOTE — Progress Notes (Signed)
Pt's states no medical or surgical changes since previsit or office visit. 

## 2020-01-03 ENCOUNTER — Telehealth: Payer: Self-pay | Admitting: *Deleted

## 2020-01-03 NOTE — Telephone Encounter (Signed)
Attempted f/u phone call. No answer. Left message. °

## 2020-01-04 ENCOUNTER — Other Ambulatory Visit: Payer: Self-pay | Admitting: Family Medicine

## 2020-01-04 ENCOUNTER — Other Ambulatory Visit: Payer: Self-pay

## 2020-01-04 ENCOUNTER — Ambulatory Visit: Payer: 59 | Admitting: Family Medicine

## 2020-01-04 ENCOUNTER — Encounter: Payer: Self-pay | Admitting: Rheumatology

## 2020-01-04 ENCOUNTER — Encounter: Payer: Self-pay | Admitting: Family Medicine

## 2020-01-04 VITALS — BP 121/78 | HR 76 | Temp 98.4°F | Ht 64.0 in | Wt 204.0 lb

## 2020-01-04 DIAGNOSIS — R251 Tremor, unspecified: Secondary | ICD-10-CM | POA: Diagnosis not present

## 2020-01-04 DIAGNOSIS — E669 Obesity, unspecified: Secondary | ICD-10-CM

## 2020-01-04 DIAGNOSIS — H538 Other visual disturbances: Secondary | ICD-10-CM

## 2020-01-04 DIAGNOSIS — K51919 Ulcerative colitis, unspecified with unspecified complications: Secondary | ICD-10-CM

## 2020-01-04 DIAGNOSIS — M6281 Muscle weakness (generalized): Secondary | ICD-10-CM

## 2020-01-04 DIAGNOSIS — R918 Other nonspecific abnormal finding of lung field: Secondary | ICD-10-CM | POA: Diagnosis not present

## 2020-01-04 DIAGNOSIS — R32 Unspecified urinary incontinence: Secondary | ICD-10-CM

## 2020-01-04 MED ORDER — MESALAMINE 400 MG PO CPDR: 800.0000 mg | DELAYED_RELEASE_CAPSULE | Freq: Two times a day (BID) | ORAL | 1 refills | Status: DC

## 2020-01-04 NOTE — Telephone Encounter (Signed)
Requested medication (s) are due for refill today: yes  Requested medication (s) are on the active medication list: no  Last refill:  12/06/19  Future visit scheduled: yes  Notes to clinic:  not delegated   Requested Prescriptions  Pending Prescriptions Disp Refills   topiramate (TOPAMAX) 25 MG tablet [Pharmacy Med Name: TOPIRAMATE 25MG  TABLETS] 60 tablet 0    Sig: TAKE 1 TABLET(25 MG) BY MOUTH TWICE DAILY      Not Delegated - Neurology: Anticonvulsants - topiramate & zonisamide Failed - 01/04/2020  7:16 AM      Failed - This refill cannot be delegated      Passed - Cr in normal range and within 360 days    Creatinine, Ser  Date Value Ref Range Status  12/18/2019 0.90 0.57 - 1.00 mg/dL Final          Passed - CO2 in normal range and within 360 days    CO2  Date Value Ref Range Status  12/18/2019 20 20 - 29 mmol/L Final          Passed - Valid encounter within last 12 months    Recent Outpatient Visits           2 weeks ago Pain in joints of both feet   [facility] Carnella [place] I, NP   1 month ago Severe episode of recurrent major depressive disorder, without psychotic features ([place])   [facility] Volney American, [place]   4 months ago Need for Td vaccine   [facility], [place], [place]   8 months ago Severe episode of recurrent major depressive disorder, without psychotic features [facility])   [facility] Volney American, [place]   10 months ago Severe episode of recurrent major depressive disorder, without psychotic features [facility])   [place], Lilia Argue, [place]       Future Appointments             Today Volney American, PA-C [facility]

## 2020-01-04 NOTE — Progress Notes (Signed)
BP 121/78   Pulse 76   Temp 98.4 F (36.9 C) (Oral)   Ht 5' 4" (1.626 m)   Wt 204 lb (92.5 kg)   SpO2 98%   BMI 35.02 kg/m    Subjective:    Patient ID: Tracy Whitaker, female    DOB: 1981/11/18, 39 y.o.   MRN: [id number]  HPI: Tracy Whitaker is a 39 y.o. female  Chief Complaint  Patient presents with  . Weight Check   Patient presenting today for weight check but has some other issues that are pressing she would rather discuss. Stopped the saxenda despite good success when all her back issues and tremors started happening a few weeks ago. Wishes to restart it once her other sxs resolve down the line.   The last few weeks experiencing sudden onset numerous sxs that have caused significant disability for her at the moment. Hot flashes, severe burning stinging pain that moves throughout body, dragging feet intermittently, tremors, hand weakness and numbness, visual blurriness and urinary incontinence the past few weeks. Working with Rheumatology on these new sxs and awaiting MRI results for SI joint as her severe hip pains are thought to be related to SI joint. Saw a neurosurgeon for the back/hip issues and that provider cleared her from spinal issues requiring any sort of Neurosurgery intervention based on her MRI. Of note, had a nearly identical episode of this constellation of sxs back in 2018 but nothing was found at that time and it was all chalked up to being a possible allergic reaction to some bactrim she'd been taking prior to onset. Muscle relaxers providing slight amount of relief to some of her sxs, mostly the tremors. Having to take 3 daily right now. Also on prednisone taper through GI currently due to a UC flare she's in which has improved.   Also notes she was found to have pulmonary nodules measuring up to 0.4 cm noted in right lung found 08/2017. Has never had f/u imaging.   Relevant past medical, surgical, family and social history reviewed and updated as  indicated. Interim medical history since our last visit reviewed. Allergies and medications reviewed and updated.  Review of Systems  Per HPI unless specifically indicated above     Objective:    BP 121/78   Pulse 76   Temp 98.4 F (36.9 C) (Oral)   Ht 5' 4" (1.626 m)   Wt 204 lb (92.5 kg)   SpO2 98%   BMI 35.02 kg/m   Wt Readings from Last 3 Encounters:  01/04/20 204 lb (92.5 kg)  01/01/20 204 lb (92.5 kg)  12/26/19 204 lb (92.5 kg)    Physical Exam Vitals and nursing note reviewed.  Constitutional:      Appearance: Normal appearance. She is not ill-appearing.  HENT:     Head: Atraumatic.     Right Ear: Tympanic membrane normal.     Left Ear: Tympanic membrane normal.     Nose: Nose normal.     Mouth/Throat:     Mouth: Mucous membranes are moist.  Eyes:     Extraocular Movements: Extraocular movements intact.     Conjunctiva/sclera: Conjunctivae normal.     Pupils: Pupils are equal, round, and reactive to light.  Cardiovascular:     Rate and Rhythm: Normal rate and regular rhythm.     Heart sounds: Normal heart sounds.  Pulmonary:     Effort: Pulmonary effort is normal.     Breath sounds: Normal breath sounds.  Abdominal:     General: Bowel sounds are normal. There is no distension.     Palpations: Abdomen is soft.     Tenderness: There is no abdominal tenderness.  Musculoskeletal:        General: No swelling. Normal range of motion.     Cervical back: Normal range of motion and neck supple.  Skin:    General: Skin is warm and dry.     Findings: No rash.  Neurological:     Mental Status: She is alert and oriented to person, place, and time.     Sensory: No sensory deficit.     Motor: Weakness present.     Coordination: Coordination normal.     Gait: Gait normal.     Comments: Appears neurologically intact currently aside from weakness and mild tremors in hands  Psychiatric:        Thought Content: Thought content normal.        Judgment: Judgment  normal.     Comments: tearful     Results for orders placed or performed in visit on 12/26/19  C-reactive protein  Result Value Ref Range   CRP <1.0 0.5 - 20.0 mg/dL  Sed Rate (ESR)  Result Value Ref Range   Sed Rate 7 0 - 20 mm/hr      Assessment & Plan:   Problem List Items Addressed This Visit      Digestive   Ulcerative colitis (HCC)    Now following with GI again, continue per their recommendations        Other   Obesity (BMI 30-39.9) - Primary    Did very well with saxenda but stopped currently due to these recurrent sxs she's been dealing with from unknown etiology. Will consider restart once feeling better in the future. Continue to watch diet      Pulmonary nodules    Will repeat chest CT for screening once current issues stabilize       Other Visit Diagnoses    Tremor       Concern for Neurologic component to these recurrent sxs, will refer for further evaluation given significant and persistent sxs   Relevant Orders   Ambulatory referral to Neurology   Blurred vision, bilateral       Relevant Orders   Ambulatory referral to Neurology   Urinary incontinence, unspecified type       Relevant Orders   Ambulatory referral to Neurology   Muscle weakness of extremity       Relevant Orders   Ambulatory referral to Neurology      45 minutes spent today in direct patient care and counseling  Follow up plan: Return if symptoms worsen or fail to improve.      

## 2020-01-07 ENCOUNTER — Telehealth: Payer: Self-pay

## 2020-01-07 ENCOUNTER — Other Ambulatory Visit: Payer: Self-pay

## 2020-01-07 NOTE — Telephone Encounter (Signed)
MRI report received via fax. Normal MRI of the pelvis. Dr. Estanislado Pandy reviewed, will send document to scan center.   Attempted to contact patient and left message on machine to advise patient of normal MRI.

## 2020-01-09 ENCOUNTER — Encounter: Payer: Self-pay | Admitting: Family Medicine

## 2020-01-09 ENCOUNTER — Encounter: Payer: Self-pay | Admitting: Rheumatology

## 2020-01-09 NOTE — Telephone Encounter (Signed)
I could not find the source of pain. She does not have sacroiliitis. She may consider pain management.

## 2020-01-09 NOTE — Assessment & Plan Note (Signed)
Did very well with saxenda but stopped currently due to these recurrent sxs she's been dealing with from unknown etiology. Will consider restart once feeling better in the future. Continue to watch diet

## 2020-01-09 NOTE — Assessment & Plan Note (Signed)
Now following with GI again, continue per their recommendations

## 2020-01-09 NOTE — Assessment & Plan Note (Signed)
Will repeat chest CT for screening once current issues stabilize

## 2020-01-14 ENCOUNTER — Other Ambulatory Visit: Payer: Self-pay | Admitting: Family Medicine

## 2020-01-14 ENCOUNTER — Telehealth: Payer: Self-pay | Admitting: Family Medicine

## 2020-01-14 MED ORDER — METHOCARBAMOL 500 MG PO TABS: 500.0000 mg | ORAL_TABLET | Freq: Three times a day (TID) | ORAL | 1 refills | Status: DC | PRN

## 2020-01-14 NOTE — Telephone Encounter (Signed)
Called pt to giver her the name and number to Gurney Maxin, MD per referral. Pt states that she spoke with someone at that office that told her that they don't treat fibromyalgia and she would need to be referred to someone who will.      Copied from [place] [phone]. Topic: General - Other >> Jan 14, 2020 11:22 AM Yvette Rack wrote: Reason for CRM: Pt called to request information regarding the neurology referral. Pt would like the practice name and phone number

## 2020-01-17 ENCOUNTER — Ambulatory Visit: Payer: 59 | Admitting: Gastroenterology

## 2020-01-25 ENCOUNTER — Encounter: Payer: Self-pay | Admitting: Family Medicine

## 2020-01-25 NOTE — Telephone Encounter (Signed)
Pt stated she did not want a apt due to missing work already and stated she would call if she starts to feel bad

## 2020-02-12 ENCOUNTER — Other Ambulatory Visit: Payer: Self-pay | Admitting: Certified Nurse Midwife

## 2020-02-25 ENCOUNTER — Encounter: Payer: Self-pay | Admitting: Family Medicine

## 2020-02-25 ENCOUNTER — Telehealth: Payer: Self-pay | Admitting: Family Medicine

## 2020-02-25 ENCOUNTER — Ambulatory Visit (INDEPENDENT_AMBULATORY_CARE_PROVIDER_SITE_OTHER): Payer: 59 | Admitting: Family Medicine

## 2020-02-25 VITALS — Wt 215.0 lb

## 2020-02-25 DIAGNOSIS — E559 Vitamin D deficiency, unspecified: Secondary | ICD-10-CM

## 2020-02-25 DIAGNOSIS — F430 Acute stress reaction: Secondary | ICD-10-CM

## 2020-02-25 DIAGNOSIS — R531 Weakness: Secondary | ICD-10-CM

## 2020-02-25 DIAGNOSIS — M25542 Pain in joints of left hand: Secondary | ICD-10-CM

## 2020-02-25 DIAGNOSIS — F3132 Bipolar disorder, current episode depressed, moderate: Secondary | ICD-10-CM

## 2020-02-25 DIAGNOSIS — M25572 Pain in left ankle and joints of left foot: Secondary | ICD-10-CM

## 2020-02-25 DIAGNOSIS — M25541 Pain in joints of right hand: Secondary | ICD-10-CM

## 2020-02-25 DIAGNOSIS — F909 Attention-deficit hyperactivity disorder, unspecified type: Secondary | ICD-10-CM | POA: Diagnosis not present

## 2020-02-25 DIAGNOSIS — F332 Major depressive disorder, recurrent severe without psychotic features: Secondary | ICD-10-CM

## 2020-02-25 DIAGNOSIS — G4700 Insomnia, unspecified: Secondary | ICD-10-CM

## 2020-02-25 DIAGNOSIS — M25571 Pain in right ankle and joints of right foot: Secondary | ICD-10-CM

## 2020-02-25 MED ORDER — CHOLECALCIFEROL 1.25 MG (50000 UT) PO TABS: 1.0000 | ORAL_TABLET | ORAL | 1 refills | Status: DC

## 2020-02-25 MED ORDER — AMPHETAMINE-DEXTROAMPHETAMINE 20 MG PO TABS: 20.0000 mg | ORAL_TABLET | Freq: Two times a day (BID) | ORAL | 0 refills | Status: DC

## 2020-02-25 MED ORDER — VENLAFAXINE HCL ER 150 MG PO CP24: 150.0000 mg | ORAL_CAPSULE | Freq: Every day | ORAL | 1 refills | Status: DC

## 2020-02-25 MED ORDER — QUETIAPINE FUMARATE 25 MG PO TABS: 25.0000 mg | ORAL_TABLET | Freq: Every day | ORAL | 1 refills | Status: DC

## 2020-02-25 MED ORDER — METHOCARBAMOL 500 MG PO TABS: 500.0000 mg | ORAL_TABLET | Freq: Three times a day (TID) | ORAL | 1 refills | Status: DC | PRN

## 2020-02-25 MED ORDER — QUETIAPINE FUMARATE 100 MG PO TABS: 100.0000 mg | ORAL_TABLET | Freq: Every day | ORAL | 1 refills | Status: DC

## 2020-02-25 NOTE — Assessment & Plan Note (Signed)
Stable on seroquel regimen, continue current medication

## 2020-02-25 NOTE — Assessment & Plan Note (Signed)
Stable and well controlled overall, continue current regimen

## 2020-02-25 NOTE — Progress Notes (Signed)
Wt 215 lb (97.5 kg)   BMI 36.90 kg/m    Subjective:    Patient ID: Tracy Whitaker, female    DOB: 10/30/1981, 39 y.o.   MRN: [id number]  HPI: Tracy Whitaker is a 39 y.o. female  Chief Complaint  Patient presents with  . ADHD    . This visit was completed via MyChart due to the restrictions of the COVID-19 pandemic. All issues as above were discussed and addressed. Physical exam was done as above through visual confirmation on MyChart. If it was felt that the patient should be evaluated in the office, they were directed there. The patient verbally consented to this visit. . Location of the patient: home . Location of the provider: work . Those involved with this call:  . Provider: Merrie Roof, PA-C . CMA: Lesle Chris, [place] . Front Desk/Registration: Jill Side  . Time spent on call: 25 minutes with patient face to face via video conference. More than 50% of this time was spent in counseling and coordination of care. 5 minutes total spent in review of patient's record and preparation of their chart. I verified patient identity using two factors (patient name and date of birth). Patient consents verbally to being seen via telemedicine visit today.   Presenting today for 3 month f/u.  Adderall continues to work well for her ADHD. Tolerating well without side effects, notes excellent benefit with focus and task completion. Denies CP, SOB, palpitations.   Moods and sleep doing well on effexor and seroquel regimen. Denies any new concerns, labile moods, SI/HI.   Working with Neurology on her extremity weakness, brain fog, and other new complaints. Had MRI this morning, awaiting nerve conduction studies next week. Started on klonopin 2-3 times daily through Neurology which has been helping.   Depression screen [facility] 2/9 02/25/2020 11/14/2019 08/31/2019  Decreased Interest 2 0 1  Down, Depressed, Hopeless 1 0 1  PHQ - 2 Score 3 0 2  Altered sleeping 2 0 0  Tired, decreased energy  2 0 1  Change in appetite '3 1 1  ' Feeling bad or failure about yourself  1 0 0  Trouble concentrating 1 0 0  Moving slowly or fidgety/restless 0 0 0  Suicidal thoughts 0 0 0  PHQ-9 Score '12 1 4  ' Difficult doing work/chores Extremely dIfficult - Not difficult at all   GAD 7 : Generalized Anxiety Score 02/25/2020 11/14/2019 04/13/2019 01/12/2019  Nervous, Anxious, on Edge 0 0 3 3  Control/stop worrying 1 0 3 3  Worry too much - different things '2 1 3 3  ' Trouble relaxing 2 0 3 3  Restless 0 0 2 3  Easily annoyed or irritable 1 0 3 3  Afraid - awful might happen 1 0 3 3  Total GAD 7 Score '7 1 20 21  ' Anxiety Difficulty Somewhat difficult Not difficult at all - Extremely difficult   Relevant past medical, surgical, family and social history reviewed and updated as indicated. Interim medical history since our last visit reviewed. Allergies and medications reviewed and updated.  Review of Systems  Per HPI unless specifically indicated above     Objective:    Wt 215 lb (97.5 kg)   BMI 36.90 kg/m   Wt Readings from Last 3 Encounters:  02/25/20 215 lb (97.5 kg)  01/04/20 204 lb (92.5 kg)  01/01/20 204 lb (92.5 kg)    Physical Exam Vitals and nursing note reviewed.  Constitutional:      General:  She is not in acute distress.    Appearance: Normal appearance.  HENT:     Head: Atraumatic.     Right Ear: External ear normal.     Left Ear: External ear normal.     Nose: Nose normal. No congestion.     Mouth/Throat:     Mouth: Mucous membranes are moist.     Pharynx: Oropharynx is clear. No posterior oropharyngeal erythema.  Eyes:     Extraocular Movements: Extraocular movements intact.     Conjunctiva/sclera: Conjunctivae normal.  Cardiovascular:     Comments: Unable to assess via virtual visit Pulmonary:     Effort: Pulmonary effort is normal. No respiratory distress.  Musculoskeletal:        General: Normal range of motion.     Cervical back: Normal range of motion.  Skin:     General: Skin is dry.     Findings: No erythema.  Neurological:     Mental Status: She is alert and oriented to person, place, and time.  Psychiatric:        Mood and Affect: Mood normal.        Thought Content: Thought content normal.        Judgment: Judgment normal.     Results for orders placed or performed in visit on 12/26/19  C-reactive protein  Result Value Ref Range   CRP <1.0 0.5 - 20.0 mg/dL  Sed Rate (ESR)  Result Value Ref Range   Sed Rate 7 0 - 20 mm/hr      Assessment & Plan:   Problem List Items Addressed This Visit      Other   ADHD (attention deficit hyperactivity disorder) - Primary    Stable and under good control, continue current regimen      Insomnia    Stable on seroquel regimen, continue current medication      Depression    Stable and well controlled overall, continue current regimen      Relevant Medications   venlafaxine XR (EFFEXOR XR) 150 MG 24 hr capsule   Pain in joints of both feet   Relevant Medications   venlafaxine XR (EFFEXOR XR) 150 MG 24 hr capsule    Other Visit Diagnoses    Vitamin D deficiency       Relevant Medications   Cholecalciferol 1.25 MG (50000 UT) TABS   Acute stress disorder       Relevant Medications   QUEtiapine (SEROQUEL) 25 MG tablet   venlafaxine XR (EFFEXOR XR) 150 MG 24 hr capsule   Bipolar 1 disorder, depressed, moderate (HCC)       Relevant Medications   QUEtiapine (SEROQUEL) 25 MG tablet   Joint pain in fingers of both hands       Relevant Medications   venlafaxine XR (EFFEXOR XR) 150 MG 24 hr capsule   Weakness       Working with Neurology for this, await test results and Neurology follow up       Follow up plan: Return in about 3 months (around 05/26/2020) for ADHD f/u.

## 2020-02-25 NOTE — Assessment & Plan Note (Signed)
Stable and under good control, continue current regimen

## 2020-02-25 NOTE — Telephone Encounter (Signed)
LVm to maket his apt ,sent letter.

## 2020-02-25 NOTE — Telephone Encounter (Signed)
-----   Message from Volney American, [place] sent at 02/25/2020  3:24 PM EDT ----- 3 month adhd f/u

## 2020-02-28 ENCOUNTER — Encounter: Payer: Self-pay | Admitting: Family Medicine

## 2020-02-29 ENCOUNTER — Encounter: Payer: Self-pay | Admitting: Family Medicine

## 2020-03-03 ENCOUNTER — Encounter: Payer: Self-pay | Admitting: Nurse Practitioner

## 2020-03-03 ENCOUNTER — Telehealth (INDEPENDENT_AMBULATORY_CARE_PROVIDER_SITE_OTHER): Payer: 59 | Admitting: Nurse Practitioner

## 2020-03-03 DIAGNOSIS — M2550 Pain in unspecified joint: Secondary | ICD-10-CM

## 2020-03-03 MED ORDER — GABAPENTIN 300 MG PO CAPS: 300.0000 mg | ORAL_CAPSULE | Freq: Every day | ORAL | 1 refills | Status: DC

## 2020-03-03 NOTE — Assessment & Plan Note (Signed)
Chronic, ongoing.  Workup thus far for pain has been negative; is now following with Neurology for pain symptoms, will add gabapentin 300 mg daily in meantime to target nerve pain.  Advised of side effects and to start taking at night.  Work note given.  Advised to call or return to clinic with adverse effects or persistent symptoms.

## 2020-03-03 NOTE — Patient Instructions (Signed)

## 2020-03-03 NOTE — Progress Notes (Signed)
There were no vitals taken for this visit.   Subjective:    Patient ID: Tracy Whitaker, female    DOB: [birth year], 39 y.o.   MRN: [id number]  HPI: Tracy Whitaker is a 39 y.o. female presenting for work note request and joint pain.  Chief Complaint  Patient presents with  . Joint Pain    Patient needs a work note due to the pain in her hands, feet, back   . Muscle Pain    all over  . Numbness    in arms  . Other    Patient was out of work Wednesday and Thursday afternoon, all day Friday and was told to stay home today. She states that she has the pain in her joints and then her arms get to where she cant lift them. She would like a work note for these days and discuss her options.    ARTHRALGIAS / JOINT ACHES Patient states that she has been to multiple specialists including rheumatologists, spine specialists, and now is seeing a Neurologist.  She is waiting to hear the results of her latest MRI.  She has an appointment with her Neurologist in about 1 week and will discuss the results and further options then.  Right now, she is taking clonazepam 0.25 mg three times daily along with methocarbamol 500 three times daily.  She is requesting something else for joint pain as she has not been able to work in the past few days due to the joint pain.  She is also requesting a work note for the time missed out of work. Duration: months Pain: yes Symmetric: yes  severe Quality: aching Frequency: constant Context:  worse Decreased function/range of motion: yes Erythema: no Swelling: yes Heat or warmth: no Morning stiffness: yes Aggravating factors: working, movement, using joints Alleviating factors: methocarbamol, clonazepam Relief with NSAIDs?: no Treatments attempted:  everything  Involved Joints:     Hands: yes bilateral    Wrists: yes bilateral     Elbows: no     Shoulders: no     Back: no     Hips: yes bilateral    Knees: yes bilateral    Ankles: yes bilateral  Feet: yes bilateral  Allergies  Allergen Reactions  . Latex Dermatitis  . Sulfamethoxazole-Trimethoprim Other (See Comments)    Joint pain   Outpatient Encounter Medications as of 03/03/2020  Medication Sig  . acyclovir (ZOVIRAX) 800 MG tablet Take 1 tablet (800 mg total) by mouth 2 (two) times daily.  Marland Kitchen acyclovir ointment (ZOVIRAX) 5 % Apply 1 application topically every 3 (three) hours.  Marland Kitchen amphetamine-dextroamphetamine (ADDERALL) 20 MG tablet Take 1 tablet (20 mg total) by mouth 2 (two) times daily.  Derrill Memo ON 04/26/2020] amphetamine-dextroamphetamine (ADDERALL) 20 MG tablet Take 1 tablet (20 mg total) by mouth 2 (two) times daily.  Derrill Memo ON 03/26/2020] amphetamine-dextroamphetamine (ADDERALL) 20 MG tablet Take 1 tablet (20 mg total) by mouth 2 (two) times daily.  . Cholecalciferol 1.25 MG (50000 UT) TABS Take 1 tablet by mouth once a week. For 8 weeks and then stop.  Return to office for lab draw.  . clonazePAM (KLONOPIN) 0.25 MG disintegrating tablet Take 0.25 mg twice a day for a week, then increase to 0.25 mg three times a day  . CRYSELLE-28 0.3-30 MG-MCG tablet TAKE 1 TABLET BY MOUTH EVERY DAY  . Mesalamine (DELZICOL) 400 MG CPDR DR capsule Take 2 capsules (800 mg total) by mouth 2 (two) times daily.  Marland Kitchen  methocarbamol (ROBAXIN) 500 MG tablet Take 1 tablet (500 mg total) by mouth every 8 (eight) hours as needed for muscle spasms.  Marland Kitchen omeprazole (PRILOSEC) 20 MG capsule TAKE 1 CAPSULE(20 MG) BY MOUTH DAILY  . QUEtiapine (SEROQUEL) 100 MG tablet Take 1 tablet (100 mg total) by mouth at bedtime.  Marland Kitchen QUEtiapine (SEROQUEL) 25 MG tablet Take 1 tablet (25 mg total) by mouth at bedtime. To be combined with 100 mg  . spironolactone (ALDACTONE) 100 MG tablet TAKE 1 TABLET BY MOUTH EVERY DAY  . topiramate (TOPAMAX) 25 MG tablet TAKE 1 TABLET(25 MG) BY MOUTH TWICE DAILY (Patient not taking: Reported on 02/25/2020)  . venlafaxine XR (EFFEXOR XR) 150 MG 24 hr capsule Take 1 capsule (150 mg total) by  mouth daily with breakfast.   No facility-administered encounter medications on file as of 03/03/2020.   Patient Active Problem List   Diagnosis Date Noted  . Pain in joints of both feet 12/18/2019  . Fatigue 12/18/2019  . Anxiety disorder 05/04/2019  . Cigarette smoker 04/19/2019  . Depression 05/12/2018  . Controlled substance agreement broken 01/30/2018  . 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 Insomnia 09/18/2017  . Ulcerative colitis ([place]) 09/15/2017  . Polycystic disease, ovaries 09/15/2017  . Myalgia 08/31/2017  . Polyarthralgia 08/31/2017  . Pulmonary nodules 08/25/2017  . Hydronephrosis, left 11/06/2016  . ADHD (attention deficit hyperactivity disorder) 04/16/2016  . Obesity (BMI 30-39.9) 04/16/2016   Past Medical History:  Diagnosis Date  . ADHD (attention deficit hyperactivity disorder)   . Anxiety and depression 09/18/2017  . Controlled substance agreement broken 01/30/2018  . DDD (degenerative disc disease), cervical    and lumbar, mid back  . Degenerative disc disease, cervical   . History of stomach ulcers   . 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 Hydronephrosis, left 11/06/2016  . Myalgia   . Plantar fasciitis   . Polyarthralgia 08/31/2017  . Polycystic disease, ovaries   . Pulmonary nodules 08/25/2017   right lung, repeat imaging in 1 year  . Ulcerative colitis ([place])    Relevant past medical, surgical, family and social history reviewed and updated as indicated. Interim medical history since our last visit reviewed.  Review of Systems  Constitutional: Positive for activity change. Negative for appetite change and fever.  Respiratory: Negative.  Negative for cough and shortness of breath.   Cardiovascular: Negative.  Negative for chest pain and palpitations.  Musculoskeletal: Positive for arthralgias, back pain, gait problem, joint swelling and myalgias. Negative for neck pain and neck stiffness.  Skin: Negative for color change.  Neurological: Negative for dizziness,  weakness, light-headedness and headaches.  Psychiatric/Behavioral: Negative for behavioral problems, confusion, decreased concentration and sleep disturbance. The patient is nervous/anxious.    Per HPI unless specifically indicated above     Objective:    There were no vitals taken for this visit.  Wt Readings from Last 3 Encounters:  02/25/20 215 lb (97.5 kg)  01/04/20 204 lb (92.5 kg)  01/01/20 204 lb (92.5 kg)    Physical Exam Vitals and nursing note reviewed.  Constitutional:      General: She is not in acute distress.    Appearance: Normal appearance. She is not toxic-appearing.  Eyes:     General: No scleral icterus.    Extraocular Movements: Extraocular movements intact.  Cardiovascular:     Comments: Unable to assess heart rate/rhythm via virtual visit Pulmonary:     Effort: Pulmonary effort is normal. No respiratory distress.  Musculoskeletal:  General: Tenderness present. No swelling or signs of injury.  Skin:    Coloration: Skin is not jaundiced or pale.  Neurological:     General: No focal deficit present.     Mental Status: She is alert and oriented to person, place, and time.     Motor: No weakness.     Gait: Gait normal.  Psychiatric:        Mood and Affect: Mood normal.        Behavior: Behavior normal.        Thought Content: Thought content normal.        Judgment: Judgment normal.        Assessment & Plan:   Problem List Items Addressed This Visit      Other   Polyarthralgia - Primary    Chronic, ongoing.  Workup thus far for pain has been negative; is now following with Neurology for pain symptoms, will add gabapentin 300 mg daily in meantime to target nerve pain.  Advised of side effects and to start taking at night.  Work note given.  Advised to call or return to clinic with adverse effects or persistent symptoms.          Follow up plan: Return if symptoms worsen or fail to improve.  Due to the catastrophic nature of the COVID-19  pandemic, this visit was completed via audio and visual contact via Mychart due to the restrictions of the COVID-19 pandemic. All issues as above were discussed and addressed. Physical exam was done as above through visual confirmation on Mychart. If it was felt that the patient should be evaluated in the office, they were directed there. The patient verbally consented to this visit."} . Location of the patient: home . Location of the provider: work . Those involved with this call:  . Provider: Carnella Guadalajara, DNP . CMA: Tiffany Reel, CMA . Front Desk/Registration: Levert Feinstein  . Time spent on call: 15 minutes on the phone discussing health concerns. 20 minutes total spent in review of patient's record and preparation of their chart.  I verified patient identity using two factors (patient name and date of birth). Patient consents verbally to being seen via telemedicine visit today.

## 2020-03-28 ENCOUNTER — Other Ambulatory Visit: Payer: Self-pay | Admitting: Family Medicine

## 2020-04-11 ENCOUNTER — Encounter: Payer: Self-pay | Admitting: Family Medicine

## 2020-04-11 ENCOUNTER — Other Ambulatory Visit: Payer: Self-pay

## 2020-04-11 ENCOUNTER — Ambulatory Visit (INDEPENDENT_AMBULATORY_CARE_PROVIDER_SITE_OTHER): Payer: 59 | Admitting: Family Medicine

## 2020-04-11 VITALS — BP 114/79 | HR 101 | Temp 98.0°F | Wt 220.0 lb

## 2020-04-11 DIAGNOSIS — E669 Obesity, unspecified: Secondary | ICD-10-CM | POA: Diagnosis not present

## 2020-04-11 DIAGNOSIS — L0293 Carbuncle, unspecified: Secondary | ICD-10-CM | POA: Diagnosis not present

## 2020-04-11 DIAGNOSIS — R918 Other nonspecific abnormal finding of lung field: Secondary | ICD-10-CM | POA: Diagnosis not present

## 2020-04-11 DIAGNOSIS — M5030 Other cervical disc degeneration, unspecified cervical region: Secondary | ICD-10-CM | POA: Diagnosis not present

## 2020-04-11 MED ORDER — DOXYCYCLINE HYCLATE 100 MG PO TABS: 100.0000 mg | ORAL_TABLET | Freq: Two times a day (BID) | ORAL | 1 refills | Status: DC

## 2020-04-11 MED ORDER — METFORMIN HCL 500 MG PO TABS: 500.0000 mg | ORAL_TABLET | Freq: Two times a day (BID) | ORAL | 1 refills | Status: DC

## 2020-04-11 NOTE — Patient Instructions (Signed)
Please call this number to schedule your mammogram. [phone]

## 2020-04-11 NOTE — Progress Notes (Signed)
   BP 114/79   Pulse (!) 101   Temp 98 F (36.7 C) (Oral)   Wt 220 lb (99.8 kg)   SpO2 98%   BMI 37.76 kg/m    Subjective:    Patient ID: Tracy Whitaker, female    DOB: 1981-05-11, 39 y.o.   MRN: [id number]  HPI: Tracy Whitaker is a 39 y.o. female  Chief Complaint  Patient presents with  . ADHD   Here today following up on multiple concerns.   Still having burning sensations in extremities but overall pain sxs are about 80% better. Neurology adjusting medications and also ordered a second opinion at [facility]. Has light duty orders for work through them.   Due to follow up on CT chest for nodules seen on previous scan. Requesting to have repeat scan.   Concerned about weight, did well on topamax and metformin in the past and had gotten down to 190 at one time. Notes her breasts have grown larger with the weight gain and her chronic upper back and neck issues are even worse since. Long hx of issues from her large breast size, has considered breast reduction surgery in the past.   More frequent boils lately, under pannus and breast, axillary area. Using boil ease and taking lysine with mild relief. Washes with tea tree oil.   Relevant past medical, surgical, family and social history reviewed and updated as indicated. Interim medical history since our last visit reviewed. Allergies and medications reviewed and updated.  Review of Systems  Per HPI unless specifically indicated above     Objective:    BP 114/79   Pulse (!) 101   Temp 98 F (36.7 C) (Oral)   Wt 220 lb (99.8 kg)   SpO2 98%   BMI 37.76 kg/m   Wt Readings from Last 3 Encounters:  04/11/20 220 lb (99.8 kg)  02/25/20 215 lb (97.5 kg)  01/04/20 204 lb (92.5 kg)    Physical Exam  Results for orders placed or performed in visit on 12/26/19  C-reactive protein  Result Value Ref Range   CRP <1.0 0.5 - 20.0 mg/dL  Sed Rate (ESR)  Result Value Ref Range   Sed Rate 7 0 - 20 mm/hr      Assessment & Plan:     Problem List Items Addressed This Visit      Musculoskeletal and Integument   Degenerative disc disease, cervical    Still dealing with ongoing pain, worse since weight gain and increase in breast size. Still considering breast reduction. Working on weight loss. Supportive care reviewed        Other   Obesity (BMI 30-39.9) - Primary    Restart metformin in addition to topamax, continue to monitor. Diet and exercise reviewed      Relevant Medications   metFORMIN (GLUCOPHAGE) 500 MG tablet   Pulmonary nodules    Will re-order CT for screening, continue to monitor      Relevant Orders   CT Chest Wo Contrast    Other Visit Diagnoses    Recurrent boils       Tx flare with doxycycline, hibiclens topically prn. Dermatology referral placed for further eval for HS   Relevant Orders   Ambulatory referral to Dermatology       Follow up plan: Return in about 3 months (around 07/12/2020) for Weight check.

## 2020-04-21 DIAGNOSIS — M5030 Other cervical disc degeneration, unspecified cervical region: Secondary | ICD-10-CM | POA: Insufficient documentation

## 2020-04-21 NOTE — Assessment & Plan Note (Signed)
Will re-order CT for screening, continue to monitor

## 2020-04-21 NOTE — Assessment & Plan Note (Signed)
Still dealing with ongoing pain, worse since weight gain and increase in breast size. Still considering breast reduction. Working on weight loss. Supportive care reviewed

## 2020-04-21 NOTE — Assessment & Plan Note (Signed)
Restart metformin in addition to topamax, continue to monitor. Diet and exercise reviewed

## 2020-04-22 ENCOUNTER — Encounter: Payer: Self-pay | Admitting: Family Medicine

## 2020-04-24 ENCOUNTER — Ambulatory Visit: Payer: 59 | Admitting: Family Medicine

## 2020-04-29 ENCOUNTER — Telehealth: Payer: Self-pay | Admitting: Family Medicine

## 2020-04-29 NOTE — Telephone Encounter (Signed)
The order placed is specifically without - I'm not sure what they are wanting it changed to, is there any way we can find out what imaging number it needs to be? Or I'm happy to cosign if they can change the order to what it needs to be

## 2020-04-29 NOTE — Telephone Encounter (Signed)
Spoke with [facility] and they state that order can not be done because CT will have to be either with contrast or without. Can not be both. Can we put a new order in? Please advise.

## 2020-05-01 NOTE — Telephone Encounter (Signed)
Called [facility] and this issue has been resolved. Thank you.

## 2020-05-02 ENCOUNTER — Encounter: Payer: Self-pay | Admitting: Nurse Practitioner

## 2020-05-02 ENCOUNTER — Ambulatory Visit (INDEPENDENT_AMBULATORY_CARE_PROVIDER_SITE_OTHER): Payer: 59 | Admitting: Nurse Practitioner

## 2020-05-02 ENCOUNTER — Other Ambulatory Visit: Payer: Self-pay

## 2020-05-02 VITALS — BP 110/78 | HR 88 | Temp 98.2°F | Wt 221.0 lb

## 2020-05-02 DIAGNOSIS — B9689 Other specified bacterial agents as the cause of diseases classified elsewhere: Secondary | ICD-10-CM | POA: Diagnosis not present

## 2020-05-02 DIAGNOSIS — N760 Acute vaginitis: Secondary | ICD-10-CM | POA: Diagnosis not present

## 2020-05-02 DIAGNOSIS — N898 Other specified noninflammatory disorders of vagina: Secondary | ICD-10-CM | POA: Insufficient documentation

## 2020-05-02 LAB — WET PREP FOR TRICH, YEAST, CLUE
Clue Cell Exam: POSITIVE — AB
Trichomonas Exam: NEGATIVE
Yeast Exam: NEGATIVE

## 2020-05-02 MED ORDER — FLUCONAZOLE 150 MG PO TABS
150.0000 mg | ORAL_TABLET | Freq: Once | ORAL | 0 refills | Status: AC
Start: 2020-05-02 — End: 2020-05-02

## 2020-05-02 MED ORDER — METRONIDAZOLE 500 MG PO TABS
500.0000 mg | ORAL_TABLET | Freq: Two times a day (BID) | ORAL | 0 refills | Status: DC
Start: 2020-05-02 — End: 2020-05-05

## 2020-05-02 NOTE — Progress Notes (Signed)
BP 110/78   Pulse 88 Comment: apical  Temp 98.2 F (36.8 C) (Oral)   Wt 221 lb (100.2 kg)   SpO2 98%   BMI 37.93 kg/m    Subjective:    Patient ID: Tracy Whitaker, female    DOB: 12/18/1980, 39 y.o.   MRN: [id number]  HPI: Tracy Whitaker is a 40 y.o. female  Chief Complaint  Patient presents with  . Vaginal Discharge   VAGINAL DISCHARGE Was on abx and started to have vaginal discharge with this.  Was taking Doxycycline.  She reports whenever she uses abx she always gets bacterial vaginosis and has to be treated then with oral Flagyl. Duration: weeks Discharge description: cottage cheese  Pruritus: yes Dysuria: no Malodorous: yes Urinary frequency: no Fevers: no Abdominal pain: yes  Sexual activity: monogamous History of sexually transmitted diseases: Herpes Recent antibiotic use: yes Context: worse", "previous yeast infections  Treatments attempted: AZO yeast  Relevant past medical, surgical, family and social history reviewed and updated as indicated. Interim medical history since our last visit reviewed. Allergies and medications reviewed and updated.  Review of Systems  Constitutional: Negative for activity change, appetite change, diaphoresis, fatigue and fever.  Respiratory: Negative for cough, chest tightness and shortness of breath.   Cardiovascular: Negative for chest pain, palpitations and leg swelling.  Genitourinary: Positive for vaginal discharge. Negative for decreased urine volume, dysuria, flank pain, frequency, hematuria, urgency and vaginal pain.  Psychiatric/Behavioral: Negative.     Per HPI unless specifically indicated above     Objective:    BP 110/78   Pulse 88 Comment: apical  Temp 98.2 F (36.8 C) (Oral)   Wt 221 lb (100.2 kg)   SpO2 98%   BMI 37.93 kg/m   Wt Readings from Last 3 Encounters:  05/02/20 221 lb (100.2 kg)  04/11/20 220 lb (99.8 kg)  02/25/20 215 lb (97.5 kg)    Physical Exam Vitals and nursing note  reviewed.  Constitutional:      General: She is awake. She is not in acute distress.    Appearance: She is well-developed and well-groomed. She is not ill-appearing.  HENT:     Head: Normocephalic.     Right Ear: Hearing normal.     Left Ear: Hearing normal.  Eyes:     General: Lids are normal.        Right eye: No discharge.        Left eye: No discharge.     Conjunctiva/sclera: Conjunctivae normal.     Pupils: Pupils are equal, round, and reactive to light.  Neck:     Vascular: No carotid bruit.  Cardiovascular:     Rate and Rhythm: Normal rate and regular rhythm.     Heart sounds: Normal heart sounds. No murmur heard.  No gallop.   Pulmonary:     Effort: Pulmonary effort is normal. No accessory muscle usage or respiratory distress.     Breath sounds: Normal breath sounds.  Abdominal:     General: Bowel sounds are normal.     Palpations: Abdomen is soft.     Tenderness: There is no abdominal tenderness. There is no right CVA tenderness or left CVA tenderness.  Musculoskeletal:     Cervical back: Normal range of motion and neck supple.     Right lower leg: No edema.     Left lower leg: No edema.  Skin:    General: Skin is warm and dry.  Neurological:     Mental  Status: She is alert and oriented to person, place, and time.  Psychiatric:        Attention and Perception: Attention normal.        Mood and Affect: Mood normal.        Speech: Speech normal.        Behavior: Behavior normal. Behavior is cooperative.        Thought Content: Thought content normal.     Results for orders placed or performed in visit on 12/26/19  C-reactive protein  Result Value Ref Range   CRP <1.0 0.5 - 20.0 mg/dL  Sed Rate (ESR)  Result Value Ref Range   Sed Rate 7 0 - 20 mm/hr      Assessment & Plan:   Problem List Items Addressed This Visit      Genitourinary   BV (bacterial vaginosis) - Primary    Acute post antibiotic use, she reports this as common issue.  Wet prep with +  clue cells, - trich and yeast.  Will send in script for oral Flagyl which she has used before, recommend she not drink alcohol with this on board.  Also send script for Diflucan x one dose in case yeast symptoms present with multiple abx use.  Continue probiotic regimen at home.  Return to office for worsening or ongoing symptoms.      Relevant Medications   metroNIDAZOLE (FLAGYL) 500 MG tablet   fluconazole (DIFLUCAN) 150 MG tablet   Other Relevant Orders   WET PREP FOR TRICH, YEAST, CLUE       Follow up plan: Return if symptoms worsen or fail to improve.

## 2020-05-02 NOTE — Patient Instructions (Signed)

## 2020-05-02 NOTE — Assessment & Plan Note (Signed)
Acute post antibiotic use, she reports this as common issue.  Wet prep with + clue cells, - trich and yeast.  Will send in script for oral Flagyl which she has used before, recommend she not drink alcohol with this on board.  Also send script for Diflucan x one dose in case yeast symptoms present with multiple abx use.  Continue probiotic regimen at home.  Return to office for worsening or ongoing symptoms.

## 2020-05-05 ENCOUNTER — Ambulatory Visit (INDEPENDENT_AMBULATORY_CARE_PROVIDER_SITE_OTHER): Payer: 59 | Admitting: Dermatology

## 2020-05-05 ENCOUNTER — Other Ambulatory Visit: Payer: Self-pay

## 2020-05-05 DIAGNOSIS — L720 Epidermal cyst: Secondary | ICD-10-CM | POA: Diagnosis not present

## 2020-05-05 MED ORDER — DOXYCYCLINE MONOHYDRATE 100 MG PO CAPS: 100.0000 mg | ORAL_CAPSULE | Freq: Two times a day (BID) | ORAL | 2 refills | Status: DC

## 2020-05-05 MED ORDER — CLINDAMYCIN PHOSPHATE 1 % EX FOAM: CUTANEOUS | 2 refills | Status: DC

## 2020-05-05 MED ORDER — METRONIDAZOLE 500 MG PO TABS: 500.0000 mg | ORAL_TABLET | Freq: Two times a day (BID) | ORAL | 0 refills | Status: AC

## 2020-05-05 NOTE — Progress Notes (Addendum)
   Follow-Up Visit   Subjective  Tracy Whitaker is a 39 y.o. female who presents for the following: Boils (axilla, inframammary). Patient has had problems with outbreaks most of her life. She was recently treated with doxycyline for one week, but that gives her GI upset and Bacterial Vaginosis. She has used chlorhexedine wash in the past. Patient also has mild ulcerative colitis.  The following portions of the chart were reviewed this encounter and updated as appropriate:      Review of Systems:  No other skin or systemic complaints except as noted in HPI or Assessment and Plan.  Objective  Well appearing patient in no apparent distress; mood and affect are within normal limits.  A focused examination was performed including face, inframammary. Relevant physical exam findings are noted in the Assessment and Plan.  Objective  Left Postauricular, Right Inframammary: Slight induration with small scar on left postauricular area and right inframammary with violaceous tender nodule. Scarring on left inframammary area and on left axilla, left lower abdomen.   Assessment & Plan  Epidermal inclusion cyst Left Postauricular, Right Inframammary  Probable Hidradenitis Suppurativa- mild/moderate  Discussed chronic condition and difficult to treat.  No cure, only control Start clindamycin foam Apply to affected areas 1-2 times daily as needed for flares. Start OTC Panoxyl 4% creamy wash in shower qd.  Let sit several minutes prior to rinsing  Start doxycycline monohydrate 145m take 1 po BID with food x 2 weeks, then take QD until gone dsp #60 2Rf.  If upsets stomach, will send in Doryx.  Discussed I&D if cysts become inflamed  Briefly discussed Humira, but don't recommend at this time.  Doxycycline should be taken with food to prevent nausea. Do not lay down for 30 minutes after taking. Be cautious with sun exposure and use good sun protection while on this medication. Pregnant women  should not take this medication.    Patient has Bacterial Vaginosis. Will send in Rx for Flagyl for pt to have on hand if needed  Continue probiotics.  Flagyl 5054mtake 1 po BID x 7 days dsp #14 0Rf.  Clindamycin Phosphate foam - Left Postauricular, Right Inframammary  doxycycline (MONODOX) 100 MG capsule - Left Postauricular, Right Inframammary  Return in about 2 months (around 07/05/2020) for Cysts/HS.   Documentation: I have reviewed the above documentation for accuracy and completeness, and I agree with the above.  TaBrendolyn PattyD

## 2020-05-05 NOTE — Patient Instructions (Addendum)
Start PanOxyl 4% Creamy Wash - Use as a body wash in the shower, let sit a few minutes before rinsing.  Doxycycline Monohydrate - Take 1 pill twice a day with food x 2 weeks, then decrease to once a day. Take until finished.  Clindamycin Foam - Apply to affected areas 1-2 times daily as needed.

## 2020-05-07 ENCOUNTER — Other Ambulatory Visit: Payer: Self-pay | Admitting: Family Medicine

## 2020-05-07 ENCOUNTER — Encounter: Payer: Self-pay | Admitting: Dermatology

## 2020-05-07 NOTE — Telephone Encounter (Signed)
Requested Prescriptions  Pending Prescriptions Disp Refills  . acyclovir (ZOVIRAX) 800 MG tablet [Pharmacy Med Name: ACYCLOVIR 800MG  TABLETS] 180 tablet 0    Sig: TAKE 1 TABLET(800 MG) BY MOUTH TWICE DAILY     Antimicrobials:  Antiviral Agents - Anti-Herpetic Passed - 05/07/2020  4:37 PM      Passed - Valid encounter within last 12 months    Recent Outpatient Visits          5 days ago BV (bacterial vaginosis)   [place], Jolene T, NP   3 weeks ago Obesity (BMI 30-39.9)   [place], [place], [place]   2 months ago [place], NP   2 months ago Attention deficit hyperactivity disorder (ADHD), unspecified ADHD type   [place], [place]   4 months ago Obesity (BMI 30-39.9)   [facility] Volney American, [place]      Future Appointments            In 3 weeks Orene Desanctis, Lilia Argue, [place], [place]

## 2020-05-07 NOTE — Telephone Encounter (Signed)
Called patient regarding MyChart message and reviewed notes from Dr. Nicole Kindred. Faxed patient a work note for today, June 16th from 2-5pm due to medication side effects. Advised patient she can come by office tomorrow morning and pick up samples of Doryx 273m and take 1/2 daily with food.  Patient advises she has Ulcerative Colitis and has been on metronidazole for BV as well as the doxycycline from our office. Tomorrow is her last day on the metronidazole.

## 2020-05-08 ENCOUNTER — Encounter: Payer: Self-pay | Admitting: Family Medicine

## 2020-05-08 ENCOUNTER — Telehealth (INDEPENDENT_AMBULATORY_CARE_PROVIDER_SITE_OTHER): Payer: 59 | Admitting: Family Medicine

## 2020-05-08 ENCOUNTER — Ambulatory Visit: Payer: 59 | Admitting: Family Medicine

## 2020-05-08 DIAGNOSIS — R197 Diarrhea, unspecified: Secondary | ICD-10-CM

## 2020-05-08 DIAGNOSIS — R112 Nausea with vomiting, unspecified: Secondary | ICD-10-CM | POA: Diagnosis not present

## 2020-05-08 DIAGNOSIS — K649 Unspecified hemorrhoids: Secondary | ICD-10-CM | POA: Diagnosis not present

## 2020-05-08 MED ORDER — ONDANSETRON 4 MG PO TBDP: 4.0000 mg | ORAL_TABLET | Freq: Three times a day (TID) | ORAL | 0 refills | Status: DC | PRN

## 2020-05-08 NOTE — Progress Notes (Signed)
There were no vitals taken for this visit.   Subjective:    Patient ID: Tracy Whitaker, female    DOB: 04/22/1981, 39 y.o.   MRN: [id number]  HPI: Tracy Whitaker is a 39 y.o. female  Chief Complaint  Patient presents with  . Other    Discuss work issues due to having to be out due to health issues. Went home yesterday due to side effects of doxycycline     . This visit was completed via MyChart due to the restrictions of the COVID-19 pandemic. All issues as above were discussed and addressed. Physical exam was done as above through visual confirmation on MyChart. If it was felt that the patient should be evaluated in the office, they were directed there. The patient verbally consented to this visit. . Location of the patient: home . Location of the provider: work . Those involved with this call:  . Provider: Merrie Roof, PA-C . CMA: Lesle Chris, [place] . Front Desk/Registration: Jill Side  . Time spent on call: 20 minutes with patient face to face via video conference. More than 50% of this time was spent in counseling and coordination of care. 5 minutes total spent in review of patient's record and preparation of their chart. I verified patient identity using two factors (patient name and date of birth). Patient consents verbally to being seen via telemedicine visit today.   Here today to discuss GI issues she's been having to take off from work because of. On doxycycline from Dermatology for boils, having diarrhea, nausea, now rectal bleeding from hemorrhoids. Has missed significant amounts of work for these issues in addition to the generalized sxs she's still having worked up (joint pains, weakness, numbness, etc). Denies fever, chills, new foods, recent travel, sick contacts. Not taking anything for sxs other than her delzicol for UC.   Relevant past medical, surgical, family and social history reviewed and updated as indicated. Interim medical history since our last visit  reviewed. Allergies and medications reviewed and updated.  Review of Systems  Per HPI unless specifically indicated above     Objective:    There were no vitals taken for this visit.  Wt Readings from Last 3 Encounters:  05/02/20 221 lb (100.2 kg)  04/11/20 220 lb (99.8 kg)  02/25/20 215 lb (97.5 kg)    Physical Exam Vitals and nursing note reviewed.  Constitutional:      General: She is not in acute distress.    Appearance: Normal appearance.  HENT:     Head: Atraumatic.     Right Ear: External ear normal.     Left Ear: External ear normal.     Nose: Nose normal. No congestion.     Mouth/Throat:     Mouth: Mucous membranes are moist.     Pharynx: Oropharynx is clear. No posterior oropharyngeal erythema.  Eyes:     Extraocular Movements: Extraocular movements intact.     Conjunctiva/sclera: Conjunctivae normal.  Cardiovascular:     Comments: Unable to assess via virtual visit Pulmonary:     Effort: Pulmonary effort is normal. No respiratory distress.  Musculoskeletal:        General: Normal range of motion.     Cervical back: Normal range of motion.  Skin:    General: Skin is dry.     Findings: No erythema.  Neurological:     Mental Status: She is alert and oriented to person, place, and time.  Psychiatric:  Mood and Affect: Mood normal.        Thought Content: Thought content normal.        Judgment: Judgment normal.     Results for orders placed or performed in visit on 05/02/20  WET PREP FOR [place], YEAST, CLUE   Specimen: Vaginal Fluid   Vaginal Flui  Result Value Ref Range   Trichomonas Exam Negative Negative   Yeast Exam Negative Negative   Clue Cell Exam Positive (A) Negative      Assessment & Plan:   Problem List Items Addressed This Visit    None    Visit Diagnoses    Nausea vomiting and diarrhea    -  Primary   Consider switching abx (managed by Derm), zofran and imodium prn. BRAT diet. 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 F/u if bleeding not resolving       Follow up plan: Return for as scheduled.

## 2020-05-13 ENCOUNTER — Other Ambulatory Visit: Payer: Self-pay

## 2020-05-13 ENCOUNTER — Encounter: Payer: Self-pay | Admitting: Dermatology

## 2020-05-13 DIAGNOSIS — L74519 Primary focal hyperhidrosis, unspecified: Secondary | ICD-10-CM

## 2020-05-13 MED ORDER — DOXYCYCLINE HYCLATE 200 MG PO TBEC: DELAYED_RELEASE_TABLET | ORAL | 2 refills | Status: DC

## 2020-05-19 ENCOUNTER — Other Ambulatory Visit: Payer: Self-pay | Admitting: Family Medicine

## 2020-05-19 ENCOUNTER — Encounter: Payer: Self-pay | Admitting: Family Medicine

## 2020-05-19 ENCOUNTER — Other Ambulatory Visit: Payer: Self-pay | Admitting: Certified Nurse Midwife

## 2020-05-22 ENCOUNTER — Encounter: Payer: Self-pay | Admitting: Family Medicine

## 2020-05-22 ENCOUNTER — Other Ambulatory Visit: Payer: Self-pay

## 2020-05-22 ENCOUNTER — Ambulatory Visit (INDEPENDENT_AMBULATORY_CARE_PROVIDER_SITE_OTHER): Payer: 59 | Admitting: Family Medicine

## 2020-05-22 VITALS — BP 111/76 | HR 92 | Temp 98.8°F | Wt 219.0 lb

## 2020-05-22 DIAGNOSIS — F430 Acute stress reaction: Secondary | ICD-10-CM

## 2020-05-22 DIAGNOSIS — F3132 Bipolar disorder, current episode depressed, moderate: Secondary | ICD-10-CM

## 2020-05-22 DIAGNOSIS — M2550 Pain in unspecified joint: Secondary | ICD-10-CM | POA: Diagnosis not present

## 2020-05-22 DIAGNOSIS — F909 Attention-deficit hyperactivity disorder, unspecified type: Secondary | ICD-10-CM | POA: Diagnosis not present

## 2020-05-22 DIAGNOSIS — N6489 Other specified disorders of breast: Secondary | ICD-10-CM | POA: Diagnosis not present

## 2020-05-22 MED ORDER — AMPHETAMINE-DEXTROAMPHETAMINE 20 MG PO TABS: 20.0000 mg | ORAL_TABLET | Freq: Two times a day (BID) | ORAL | 0 refills | Status: DC

## 2020-05-22 MED ORDER — GABAPENTIN 300 MG PO CAPS: 300.0000 mg | ORAL_CAPSULE | Freq: Every day | ORAL | 1 refills | Status: DC

## 2020-05-22 MED ORDER — METHOCARBAMOL 500 MG PO TABS: 500.0000 mg | ORAL_TABLET | Freq: Three times a day (TID) | ORAL | 1 refills | Status: DC | PRN

## 2020-05-22 MED ORDER — QUETIAPINE FUMARATE 25 MG PO TABS: 25.0000 mg | ORAL_TABLET | Freq: Every day | ORAL | 1 refills | Status: DC

## 2020-05-22 MED ORDER — SPIRONOLACTONE 100 MG PO TABS: 100.0000 mg | ORAL_TABLET | Freq: Every day | ORAL | 1 refills | Status: DC

## 2020-05-22 MED ORDER — METFORMIN HCL 500 MG PO TABS: 500.0000 mg | ORAL_TABLET | Freq: Two times a day (BID) | ORAL | 1 refills | Status: DC

## 2020-05-22 MED ORDER — QUETIAPINE FUMARATE 100 MG PO TABS: 100.0000 mg | ORAL_TABLET | Freq: Every day | ORAL | 1 refills | Status: DC

## 2020-05-22 MED ORDER — OMEPRAZOLE 20 MG PO CPDR: DELAYED_RELEASE_CAPSULE | ORAL | 1 refills | Status: DC

## 2020-05-22 MED ORDER — MESALAMINE 400 MG PO CPDR: 800.0000 mg | DELAYED_RELEASE_CAPSULE | Freq: Two times a day (BID) | ORAL | 1 refills | Status: DC

## 2020-05-22 NOTE — Progress Notes (Signed)
BP 111/76   Pulse 92   Temp 98.8 F (37.1 C) (Oral)   Wt 219 lb (99.3 kg)   SpO2 96%   BMI 37.59 kg/m    Subjective:    Patient ID: Tracy Whitaker, female    DOB: [birth year], 39 y.o.   MRN: [id number]  HPI: Tracy Whitaker is a 39 y.o. female  Chief Complaint  Patient presents with  . Medication Refill    pt requested 3 month suply  . ADHD   Presenting today for 3 month ADHD f/u, and also requesting refills on all medications as she was fired this past week and will be losing her health insurance very soon. States her adderall continues to do well for her, has been on this dose for quite some time without side effects. Significant benefit with focus and task completion.   Also notes right breast seems larger than left x 1 year. No obvious masses, skin changes, tenderness, nipple discharge but requesting mammogram. Has never had one before. No known fhx of breast disease.   Relevant past medical, surgical, family and social history reviewed and updated as indicated. Interim medical history since our last visit reviewed. Allergies and medications reviewed and updated.  Review of Systems  Per HPI unless specifically indicated above     Objective:    BP 111/76   Pulse 92   Temp 98.8 F (37.1 C) (Oral)   Wt 219 lb (99.3 kg)   SpO2 96%   BMI 37.59 kg/m   Wt Readings from Last 3 Encounters:  05/22/20 219 lb (99.3 kg)  05/02/20 221 lb (100.2 kg)  04/11/20 220 lb (99.8 kg)    Physical Exam Vitals and nursing note reviewed.  Constitutional:      Appearance: Normal appearance. She is not ill-appearing.  HENT:     Head: Atraumatic.  Eyes:     Extraocular Movements: Extraocular movements intact.     Conjunctiva/sclera: Conjunctivae normal.  Cardiovascular:     Rate and Rhythm: Normal rate and regular rhythm.     Heart sounds: Normal heart sounds.  Pulmonary:     Effort: Pulmonary effort is normal.     Breath sounds: Normal breath sounds.  Chest:      Comments: Right breast slightly larger appearing than left. No obvious palpable masses, skin changes, tenderness to palpation, nipple discharge on either breast Musculoskeletal:        General: Normal range of motion.     Cervical back: Normal range of motion and neck supple.  Lymphadenopathy:     Upper Body:     Right upper body: No axillary adenopathy.     Left upper body: No axillary adenopathy.  Skin:    General: Skin is warm and dry.  Neurological:     Mental Status: She is alert and oriented to person, place, and time.  Psychiatric:        Mood and Affect: Mood normal.        Thought Content: Thought content normal.        Judgment: Judgment normal.     Results for orders placed or performed in visit on 05/02/20  WET PREP FOR [place], YEAST, CLUE   Specimen: Vaginal Fluid   Vaginal Flui  Result Value Ref Range   Trichomonas Exam Negative Negative   Yeast Exam Negative Negative   Clue Cell Exam Positive (A) Negative      Assessment & Plan:   Problem List Items Addressed This Visit  Other   ADHD (attention deficit hyperactivity disorder) - Primary    Stable and under good control, continue current regimen      Polyarthralgia    Stable, continue current regimen and supportive care.       Relevant Medications   gabapentin (NEURONTIN) 300 MG capsule    Other Visit Diagnoses    Breast asymmetry       Diagnostic mammogram and ultrasounds ordered for further evaluation   Relevant Orders   MM DIAG BREAST TOMO BILATERAL   US BREAST LTD UNI LEFT INC AXILLA   US BREAST LTD UNI RIGHT INC AXILLA   Bipolar 1 disorder, depressed, moderate (HCC)       Relevant Medications   QUEtiapine (SEROQUEL) 25 MG tablet   Acute stress disorder       Relevant Medications   QUEtiapine (SEROQUEL) 25 MG tablet      45 minutes spent today in direct patient care and counseling  Follow up plan: Return in about 3 months (around 08/22/2020) for ADHD.

## 2020-05-22 NOTE — Patient Instructions (Addendum)
[place] [phone]

## 2020-05-23 ENCOUNTER — Ambulatory Visit: Payer: 59 | Admitting: Family Medicine

## 2020-05-27 ENCOUNTER — Other Ambulatory Visit: Payer: Self-pay | Admitting: Family Medicine

## 2020-05-27 ENCOUNTER — Encounter: Payer: Self-pay | Admitting: Family Medicine

## 2020-05-27 MED ORDER — SPIRONOLACTONE 100 MG PO TABS: 100.0000 mg | ORAL_TABLET | Freq: Every day | ORAL | 1 refills | Status: DC

## 2020-05-27 NOTE — Assessment & Plan Note (Signed)
Stable, continue current regimen and supportive care.

## 2020-05-27 NOTE — Assessment & Plan Note (Signed)
Stable and under good control, continue current regimen 

## 2020-05-29 ENCOUNTER — Ambulatory Visit: Payer: 59 | Admitting: Family Medicine

## 2020-05-29 ENCOUNTER — Ambulatory Visit (INDEPENDENT_AMBULATORY_CARE_PROVIDER_SITE_OTHER): Payer: 59 | Admitting: Dermatology

## 2020-05-29 ENCOUNTER — Other Ambulatory Visit: Payer: Self-pay

## 2020-05-29 DIAGNOSIS — L732 Hidradenitis suppurativa: Secondary | ICD-10-CM

## 2020-05-29 MED ORDER — MINOCYCLINE HCL ER 105 MG PO TB24: 1.0000 | ORAL_TABLET | Freq: Two times a day (BID) | ORAL | 1 refills | Status: DC

## 2020-05-29 NOTE — Progress Notes (Signed)
   Follow-Up Visit   Subjective  Tracy Whitaker is a 39 y.o. female who presents for the following: HS (groin, axilla, inner thighs, under breast, Doryx 21m 1/2 po qd irritated stomach/diarrhea , Clindamycin foam, antibacterial bodywash, no hx of MRSA).  Spots are improved, cyst behind her ear drained.  No active lesions today.  Her health insurance runs out next week due to losing her job, but she is applying for Medicaid.  [facility]   The following portions of the chart were reviewed this encounter and updated as appropriate:      Review of Systems:  No other skin or systemic complaints except as noted in HPI or Assessment and Plan.  Objective  Well appearing patient in no apparent distress; mood and affect are within normal limits.  A focused examination was performed including face, axilla, inframammary, groin. Relevant physical exam findings are noted in the Assessment and Plan.  Objective  bil axilla, groin, inner thighs, inframammary: Small Sub q nodule L inguinal groin, hyperpigmented patch super pubic, scarring with dyspigmentation R inframammary, small 3.027mnodule L post earlobe, dyspigmented scar L axilla, dyspigmented scar L lower abdomen   Assessment & Plan     Hidradenitis suppurativa bil axilla, groin, inner thighs, inframammary  Vs Staph Abcesses (MRSA?)  Much improved on Doxycycline DR, but not well tolerated  D/c Doxycycline 20028mR, irritates pts stomach/diarrhea Start Minocycline 105 mg 1 po qd with food Cont Clindamycin foam qd Start BPO wash or hibiclens to body folds Start mupirocin ointment to nares bid  x 1 week  Recommend RTC for culture if flared.  Discussed Humira, info given, but need to rule out staph first  Potential medication side effects were discussed with the patient; let me know if any occur.   Minocycline HCl 105 MG TB24 - bil axilla, groin, inner thighs, inframammary  Return in about 2  months (around 07/30/2020) for HS.  I, [name] Hupman, RMA, am acting as scribe for TarBrendolyn PattyD . Documentation: I have reviewed the above documentation for accuracy and completeness, and I agree with the above.  TarBrendolyn Patty

## 2020-05-30 ENCOUNTER — Ambulatory Visit
Admission: RE | Admit: 2020-05-30 | Discharge: 2020-05-30 | Disposition: A | Discharge status: Home / Self Care | Payer: 59 | Source: Ambulatory Visit | Attending: Family Medicine | Admitting: Family Medicine

## 2020-05-30 DIAGNOSIS — N6489 Other specified disorders of breast: Secondary | ICD-10-CM

## 2020-06-01 ENCOUNTER — Encounter: Payer: Self-pay | Admitting: Family Medicine

## 2020-06-02 ENCOUNTER — Telehealth: Payer: Self-pay

## 2020-06-02 MED ORDER — MUPIROCIN 2 % EX OINT
1.0000 | TOPICAL_OINTMENT | Freq: Two times a day (BID) | CUTANEOUS | 1 refills | Status: DC
Start: 2020-06-02 — End: 2020-08-22

## 2020-06-02 NOTE — Telephone Encounter (Signed)
-----   Message from Tara Stewart, MD sent at 05/29/2020  7:10 PM EDT ----- [name], Could you send in Mupirocin ointment for her to apply inside nostrils bid x 1 week, and remind her to get Hibiclens or Panoxyl (or equivalent BPO) wash to use in body folds in shower daily.  Let sit several minutes before rinsing.  She should also wash her bath towels weekly.  This is to help prevent staph abscesses.    Also the Minocycline 105 mg ER was sent in as twice daily by mistake, but it should be once daily.  If the pharmacy gives her double the amount that is fine, since she doesn't have insurance after next week and it will last her 2 months, but just let her know to just take it once daily, since it is extended release.  [name], if we get a PA, we will have to do it fast, so she can get her medication before the 15th.  If it isn't approved by Wednesday, then we need to send in Minocycline 100 mg PO bid, so she can at least get something before her insurance runs out.  Thanks!  

## 2020-06-02 NOTE — Telephone Encounter (Signed)
Advised pt of Mupirocin being sent in and how to use it.  Advised pt to start hibiclens or BPO  Wash qd to body folds, let sit several minutes and rinse off.  Also advised to take the Minocycline 157m 1 po qd./sh

## 2020-06-03 ENCOUNTER — Telehealth: Payer: Self-pay

## 2020-06-03 MED ORDER — MINOCYCLINE HCL 100 MG PO CAPS
100.0000 mg | ORAL_CAPSULE | Freq: Two times a day (BID) | ORAL | 1 refills | Status: DC
Start: 2020-06-03 — End: 2020-12-04

## 2020-06-03 NOTE — Telephone Encounter (Signed)
-----   Message from Brendolyn Patty, MD sent at 05/29/2020  7:10 PM EDT ----- Davy Pique, Could you send in Mupirocin ointment for her to apply inside nostrils bid x 1 week, and remind her to get Hibiclens or Panoxyl (or equivalent BPO) wash to use in body folds in shower daily.  Let sit several minutes before rinsing.  She should also wash her bath towels weekly.  This is to help prevent staph abscesses.    Also the Minocycline 105 mg ER was sent in as twice daily by mistake, but it should be once daily.  If the pharmacy gives her double the amount that is fine, since she doesn't have insurance after next week and it will last her 2 months, but just let her know to just take it once daily, since it is extended release.  Estill Bamberg, if we get a PA, we will have to do it fast, so she can get her medication before the 15th.  If it isn't approved by Wednesday, then we need to send in Minocycline 100 mg PO bid, so she can at least get something before her insurance runs out.  Thanks!

## 2020-06-03 NOTE — Telephone Encounter (Signed)
Minocycline 105 was not covered. Minocycline 162m was sent in today per Dr. SNicole Kindredand patient advised.

## 2020-06-10 ENCOUNTER — Other Ambulatory Visit: Payer: Self-pay | Admitting: Family Medicine

## 2020-06-16 ENCOUNTER — Encounter: Payer: Self-pay | Admitting: Family Medicine

## 2020-06-17 ENCOUNTER — Encounter: Payer: Self-pay | Admitting: Family Medicine

## 2020-07-09 DIAGNOSIS — G8929 Other chronic pain: Secondary | ICD-10-CM | POA: Diagnosis not present

## 2020-07-09 DIAGNOSIS — M549 Dorsalgia, unspecified: Secondary | ICD-10-CM | POA: Diagnosis not present

## 2020-07-09 DIAGNOSIS — Z0000 Encounter for general adult medical examination without abnormal findings: Secondary | ICD-10-CM | POA: Diagnosis not present

## 2020-07-09 DIAGNOSIS — R0602 Shortness of breath: Secondary | ICD-10-CM | POA: Diagnosis not present

## 2020-07-09 DIAGNOSIS — Z79899 Other long term (current) drug therapy: Secondary | ICD-10-CM | POA: Diagnosis not present

## 2020-07-14 ENCOUNTER — Other Ambulatory Visit: Payer: Self-pay | Admitting: Family Medicine

## 2020-07-14 NOTE — Telephone Encounter (Signed)
Requested medication (s) are due for refill today: yes  Requested medication (s) are on the active medication list:yes  Last refill:  05/08/20  #40  0 refills  Future visit scheduled: yes  Notes to clinic:  Not delegated    Requested Prescriptions  Pending Prescriptions Disp Refills   ondansetron (ZOFRAN-ODT) 4 MG disintegrating tablet [Pharmacy Med Name: ONDANSETRON ODT 4MG TABLETS] 40 tablet 0    Sig: DISSOLVE 1 TABLET(4 MG) ON THE TONGUE EVERY 8 HOURS AS NEEDED      Not Delegated - Gastroenterology: Antiemetics Failed - 07/14/2020 11:49 AM      Failed - This refill cannot be delegated      Passed - Valid encounter within last 6 months    Recent Outpatient Visits           1 month ago Attention deficit hyperactivity disorder (ADHD), unspecified ADHD type   [place], [place]   2 months ago Nausea vomiting and diarrhea   [place], [place]   2 months ago BV (bacterial vaginosis)   [place], [place] T, NP   3 months ago Obesity (BMI 30-39.9)   [facility] Merrie Roof [place], [place]   4 months ago [place], NP       Future Appointments             In 2 weeks Brendolyn Patty, MD [place]   In 1 month Eulogio Bear, NP [facility], [facility]

## 2020-07-24 ENCOUNTER — Other Ambulatory Visit: Payer: Self-pay | Admitting: Family Medicine

## 2020-07-24 NOTE — Telephone Encounter (Signed)
Requested medication (s) are due for refill today: yes   Requested medication (s) are on the active medication list: yes   Last refill: 04/18/2020  Future visit scheduled:yes   Notes to clinic: this refill cannot be delegated    Requested Prescriptions  Pending Prescriptions Disp Refills   Mesalamine (ASACOL) 400 MG CPDR DR capsule [Pharmacy Med Name: MESALAMINE 400MG DR CAPSULES] 180 capsule 1    Sig: TAKE 2 CAPSULES(800 MG) BY MOUTH TWICE DAILY      Not Delegated - Gastroenterology:  Inflammatory Bowel Disease (Oral) Failed - 07/24/2020  8:44 AM      Failed - This refill cannot be delegated      Failed - Cr in normal range and within 180 days    Creatinine, Ser  Date Value Ref Range Status  12/18/2019 0.90 0.57 - 1.00 mg/dL Final          Failed - AST in normal range and within 180 days    AST  Date Value Ref Range Status  12/18/2019 17 0 - 40 IU/L Final          Failed - ALT in normal range and within 180 days    ALT  Date Value Ref Range Status  12/18/2019 16 0 - 32 IU/L Final          Failed - WBC in normal range and within 180 days    WBC  Date Value Ref Range Status  12/18/2019 7.3 3.4 - 10.8 x10E3/uL Final  08/23/2017 10.8 3.6 - 11.0 K/uL Final          Failed - HGB in normal range and within 180 days    Hemoglobin  Date Value Ref Range Status  12/18/2019 16.8 (H) 11.1 - 15.9 g/dL Final          Failed - HCT in normal range and within 180 days    Hematocrit  Date Value Ref Range Status  12/18/2019 49.7 (H) 34.0 - 46.6 % Final          Failed - PLT in normal range and within 180 days    Platelets  Date Value Ref Range Status  12/18/2019 359 150 - 450 x10E3/uL Final          Passed - Valid encounter within last 6 months    Recent Outpatient Visits           2 months ago Attention deficit hyperactivity disorder (ADHD), unspecified ADHD type   [facility] Volney American, PA-C   2 months ago Nausea vomiting and diarrhea    [place], [place], [place]   2 months ago BV (bacterial vaginosis)   [place], [place] T, NP   3 months ago Obesity (BMI 30-39.9)   [facility] Merrie Roof [place], [place]   4 months ago [place], NP       Future Appointments             In 5 days Brendolyn Patty, MD [place]   In 4 weeks Eulogio Bear, NP [facility], [place]

## 2020-07-24 NOTE — Telephone Encounter (Signed)
  Notes to clinic: script shows it was sent today Refill cannot be delegated or refused by NT    Requested Prescriptions  Pending Prescriptions Disp Refills   Mesalamine (ASACOL) 400 MG CPDR DR capsule [Pharmacy Med Name: MESALAMINE 400MG DR CAPSULES] 180 capsule 0    Sig: TAKE 2 CAPSULES(800 MG) BY MOUTH TWICE DAILY      Not Delegated - Gastroenterology:  Inflammatory Bowel Disease (Oral) Failed - 07/24/2020 11:26 AM      Failed - This refill cannot be delegated      Failed - Cr in normal range and within 180 days    Creatinine, Ser  Date Value Ref Range Status  12/18/2019 0.90 0.57 - 1.00 mg/dL Final          Failed - AST in normal range and within 180 days    AST  Date Value Ref Range Status  12/18/2019 17 0 - 40 IU/L Final          Failed - ALT in normal range and within 180 days    ALT  Date Value Ref Range Status  12/18/2019 16 0 - 32 IU/L Final          Failed - WBC in normal range and within 180 days    WBC  Date Value Ref Range Status  12/18/2019 7.3 3.4 - 10.8 x10E3/uL Final  08/23/2017 10.8 3.6 - 11.0 K/uL Final          Failed - HGB in normal range and within 180 days    Hemoglobin  Date Value Ref Range Status  12/18/2019 16.8 (H) 11.1 - 15.9 g/dL Final          Failed - HCT in normal range and within 180 days    Hematocrit  Date Value Ref Range Status  12/18/2019 49.7 (H) 34.0 - 46.6 % Final          Failed - PLT in normal range and within 180 days    Platelets  Date Value Ref Range Status  12/18/2019 359 150 - 450 x10E3/uL Final          Passed - Valid encounter within last 6 months    Recent Outpatient Visits           2 months ago Attention deficit hyperactivity disorder (ADHD), unspecified ADHD type   [facility] Volney American, PA-C   2 months ago Nausea vomiting and diarrhea   [place], [place], [place]   2 months ago BV (bacterial vaginosis)   [place],  [place] T, NP   3 months ago Obesity (BMI 30-39.9)   [facility] [place] Merrie Roof [place], [place]   4 months ago [place], NP       Future Appointments             In 5 days Brendolyn Patty, MD [place]   In 4 weeks Eulogio Bear, NP [facility], [place]

## 2020-07-25 ENCOUNTER — Encounter: Payer: Self-pay | Admitting: Dermatology

## 2020-07-29 ENCOUNTER — Ambulatory Visit: Payer: 59 | Admitting: Dermatology

## 2020-08-19 ENCOUNTER — Other Ambulatory Visit: Payer: Self-pay | Admitting: Family Medicine

## 2020-08-21 ENCOUNTER — Encounter: Payer: Self-pay | Admitting: Nurse Practitioner

## 2020-08-22 ENCOUNTER — Ambulatory Visit: Payer: 59 | Admitting: Family Medicine

## 2020-08-22 ENCOUNTER — Telehealth (INDEPENDENT_AMBULATORY_CARE_PROVIDER_SITE_OTHER): Payer: Medicaid Other | Admitting: Nurse Practitioner

## 2020-08-22 ENCOUNTER — Encounter: Payer: Self-pay | Admitting: Nurse Practitioner

## 2020-08-22 DIAGNOSIS — G4700 Insomnia, unspecified: Secondary | ICD-10-CM | POA: Diagnosis not present

## 2020-08-22 DIAGNOSIS — F909 Attention-deficit hyperactivity disorder, unspecified type: Secondary | ICD-10-CM

## 2020-08-22 DIAGNOSIS — M2550 Pain in unspecified joint: Secondary | ICD-10-CM | POA: Diagnosis not present

## 2020-08-22 DIAGNOSIS — L0293 Carbuncle, unspecified: Secondary | ICD-10-CM

## 2020-08-22 DIAGNOSIS — M5030 Other cervical disc degeneration, unspecified cervical region: Secondary | ICD-10-CM | POA: Diagnosis not present

## 2020-08-22 DIAGNOSIS — F3132 Bipolar disorder, current episode depressed, moderate: Secondary | ICD-10-CM | POA: Diagnosis not present

## 2020-08-22 MED ORDER — AMPHETAMINE-DEXTROAMPHETAMINE 20 MG PO TABS: 20.0000 mg | ORAL_TABLET | Freq: Two times a day (BID) | ORAL | 0 refills | Status: DC

## 2020-08-22 MED ORDER — AMPHETAMINE-DEXTROAMPHETAMINE 20 MG PO TABS
20.0000 mg | ORAL_TABLET | Freq: Two times a day (BID) | ORAL | 0 refills | Status: DC
Start: 2020-10-28 — End: 2020-12-17

## 2020-08-22 MED ORDER — QUETIAPINE FUMARATE 200 MG PO TABS: 200.0000 mg | ORAL_TABLET | Freq: Every day | ORAL | 1 refills | Status: DC

## 2020-08-22 MED ORDER — AMPHETAMINE-DEXTROAMPHETAMINE 20 MG PO TABS
20.0000 mg | ORAL_TABLET | Freq: Two times a day (BID) | ORAL | 0 refills | Status: DC
Start: 2020-09-27 — End: 2020-12-17

## 2020-08-22 NOTE — Progress Notes (Signed)
MyChart Video Visit    Virtual Visit via Video Note   This visit type was conducted due to national recommendations for restrictions regarding the COVID-19 Pandemic (e.g. social distancing) in an effort to limit this patient's exposure and mitigate transmission in our community. This patient is at least at moderate risk for complications without adequate follow up. This format is felt to be most appropriate for this patient at this time. Physical exam was limited by quality of the video and audio technology used for the visit.   Patient location: home Provider location: home  I discussed the limitations of evaluation and management by telemedicine and the availability of in person appointments. The patient expressed understanding and agreed to proceed.  Patient: Tracy Whitaker   DOB: [birth year]   39 y.o. Female  MRN: [id number] Visit Date: 08/22/2020  Today's healthcare provider: Eulogio Bear, NP   No chief complaint on file.  Subjective    HPI  Follow up for ADHD  The patient was last seen for this 3 months ago. Changes made at last visit include no changes.  She reports excellent compliance with treatment. She feels that condition is Unchanged. She is not having side effects.  Requesting refill of Adderall.  Currently taking 20 mg two times daily and it helping her with focus and concentration.  INSOMNIA Currently taking quetiapine 175 mg to help with sleep and is requesting to go up to 200.  Has been struggling lately with pain and stress with losing job and insurance and new diagnosis of fibromyalgia. Duration: chronic Satisfied with sleep quality: yes Difficulty falling asleep: yes Difficulty staying asleep: yes Waking a few hours after sleep onset: yes Early morning awakenings: yes Daytime hypersomnolence: yes Wakes feeling refreshed: no Good sleep hygiene: yes Apnea: no Snoring: no Depressed/anxious mood: yes Recent stress: yes Restless  legs/nocturnal leg cramps: no Chronic pain/arthritis: yes History of sleep study: no Treatments attempted: Seroquel   Also reports that she needs multiple referrals since she has recently changed insurance.   Needs referral for Neurologist - Dr. Melrose Nakayama at [facility] in [place].  Has an appointment with him next week. Needs referral to pain management - [facility] Dr. Hubbard Robinson in [facility].  Has an appointment next week. Needs referral for [facility] for the boils all over body.  Saw Dr. Nicole Kindred in the past and would like to see her again. Was seeing chiropractor; needs one that accepts insurance. Also needs referral for new Psychiatrist. -----------------------------------------------------------------------------------------  Patient Active Problem List   Diagnosis Date Noted  . Recurrent boils 08/22/2020  . Degenerative disc disease, cervical   . Pain in joints of both feet 12/18/2019  . Fatigue 12/18/2019  . Anxiety disorder 05/04/2019  . Cigarette smoker 04/19/2019  . Depression 05/12/2018  . Controlled substance agreement broken 01/30/2018  . 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 Insomnia 09/18/2017  . Ulcerative colitis ([place]) 09/15/2017  . Polycystic disease, ovaries 09/15/2017  . Myalgia 08/31/2017  . Polyarthralgia 08/31/2017  . Pulmonary nodules 08/25/2017  . Hydronephrosis, left 11/06/2016  . ADHD (attention deficit hyperactivity disorder) 04/16/2016  . Obesity (BMI 30-39.9) 04/16/2016  . BV (bacterial vaginosis) 03/04/2016   Social History   Tobacco Use  . Smoking status: Current Every Day Smoker    Packs/day: 0.50    Years: 20.00    Pack years: 10.00    Types: Cigarettes  . Smokeless tobacco: Never Used  Vaping Use  . Vaping Use: Never used  Substance Use  Topics  . Alcohol use: Yes    Comment: "once every couple of weeks"  . Drug use: No   Allergies  Allergen Reactions  . Latex Dermatitis  . Sulfamethoxazole-Trimethoprim Other (See Comments)      Joint pain      Medications: Outpatient Medications Prior to Visit  Medication Sig  . acyclovir (ZOVIRAX) 800 MG tablet TAKE 1 TABLET(800 MG) BY MOUTH TWICE DAILY  . acyclovir ointment (ZOVIRAX) 5 % Apply 1 application topically every 3 (three) hours.  . Cholecalciferol 1.25 MG (50000 UT) TABS Take 1 tablet by mouth once a week. For 8 weeks and then stop.  Return to office for lab draw.  . CRYSELLE-28 0.3-30 MG-MCG tablet TAKE 1 TABLET BY MOUTH EVERY DAY  . divalproex (DEPAKOTE) 250 MG DR tablet Take 250 mg by mouth 2 (two) times daily.  Marland Kitchen gabapentin (NEURONTIN) 600 MG tablet Take 600 mg by mouth 3 (three) times daily.   . Mesalamine (ASACOL) 400 MG CPDR DR capsule TAKE 2 CAPSULES(800 MG) BY MOUTH TWICE DAILY  . metFORMIN (GLUCOPHAGE) 500 MG tablet Take 1 tablet (500 mg total) by mouth 2 (two) times daily with a meal.  . methocarbamol (ROBAXIN) 500 MG tablet Take 1 tablet (500 mg total) by mouth every 8 (eight) hours as needed for muscle spasms.  Marland Kitchen omeprazole (PRILOSEC) 20 MG capsule TAKE 1 CAPSULE(20 MG) BY MOUTH DAILY  . ondansetron (ZOFRAN-ODT) 4 MG disintegrating tablet DISSOLVE 1 TABLET(4 MG) ON THE TONGUE EVERY 8 HOURS AS NEEDED  . spironolactone (ALDACTONE) 100 MG tablet Take 1 tablet (100 mg total) by mouth daily.  Marland Kitchen venlafaxine (EFFEXOR) 75 MG tablet Take 75 mg by mouth daily. At night  . [DISCONTINUED] amphetamine-dextroamphetamine (ADDERALL) 20 MG tablet Take 1 tablet (20 mg total) by mouth 2 (two) times daily.  . [DISCONTINUED] gabapentin (NEURONTIN) 300 MG capsule Take 1 capsule (300 mg total) by mouth at bedtime. (Patient taking differently: Take 600 mg by mouth 3 (three) times daily. )  . [DISCONTINUED] QUEtiapine (SEROQUEL) 100 MG tablet Take 1 tablet (100 mg total) by mouth at bedtime.  . [DISCONTINUED] QUEtiapine (SEROQUEL) 25 MG tablet Take 1 tablet (25 mg total) by mouth at bedtime. To be combined with 100 mg (Patient taking differently: Take 75 mg by mouth at bedtime.  To be combined with 100 mg)  . [DISCONTINUED] venlafaxine XR (EFFEXOR-XR) 75 MG 24 hr capsule TAKE 1 CAPSULE(75 MG) BY MOUTH EVERY DAY  . clonazePAM (KLONOPIN) 0.5 MG tablet Take 0.5 mg by mouth 3 (three) times daily as needed. (Patient not taking: Reported on 08/22/2020)  . minocycline (MINOCIN) 100 MG capsule Take 1 capsule (100 mg total) by mouth 2 (two) times daily. (Patient not taking: Reported on 08/22/2020)  . Minocycline HCl 105 MG TB24 Take 1 tablet (105 mg total) by mouth in the morning and at bedtime. (Patient not taking: Reported on 08/22/2020)  . traMADol (ULTRAM) 50 MG tablet Take 50 mg by mouth 2 (two) times daily.  . [DISCONTINUED] amphetamine-dextroamphetamine (ADDERALL) 20 MG tablet Take 1 tablet (20 mg total) by mouth 2 (two) times daily.  . [DISCONTINUED] amphetamine-dextroamphetamine (ADDERALL) 20 MG tablet Take 1 tablet (20 mg total) by mouth 2 (two) times daily.  . [DISCONTINUED] Cholecalciferol (VITAMIN D3) 1.25 MG (50000 UT) CAPS Take 1 capsule by mouth once a week.  . [DISCONTINUED] Clindamycin Phosphate foam Apply to affected areas 1-2 times a day as needed for flares. (Patient not taking: Reported on 08/22/2020)  . [DISCONTINUED] Doxycycline Hyclate (  DORYX) 200 MG TBEC Take 1/2 tablet once daily with food (Patient not taking: Reported on 05/22/2020)  . [DISCONTINUED] mupirocin ointment (BACTROBAN) 2 % Apply 1 application topically 2 (two) times daily. Apply in each nostril bid for 1 week (Patient not taking: Reported on 08/22/2020)   No facility-administered medications prior to visit.    Review of Systems  Constitutional: Negative.  Negative for appetite change, fatigue and unexpected weight change.  Respiratory: Negative.  Negative for chest tightness and shortness of breath.   Cardiovascular: Negative for chest pain and palpitations.  Gastrointestinal: Negative.   Musculoskeletal: Positive for arthralgias, back pain, gait problem and myalgias.  Skin: Negative.     Psychiatric/Behavioral: Positive for sleep disturbance. Negative for agitation, behavioral problems and decreased concentration. The patient is not nervous/anxious and is not hyperactive.     Objective    There were no vitals taken for this visit.  Physical Exam Vitals and nursing note reviewed.  Constitutional:      Appearance: Normal appearance. She is obese. She is not toxic-appearing.  HENT:     Head: Normocephalic and atraumatic.     Right Ear: External ear normal.     Left Ear: External ear normal.  Eyes:     General: No scleral icterus.    Extraocular Movements: Extraocular movements intact.  Cardiovascular:     Comments: Unable to assess heart sounds via virtual visit. Pulmonary:     Effort: Pulmonary effort is normal. No respiratory distress.     Comments: Unable to assess lung sounds via virtual visit.  Patient talking in complete sentences. Abdominal:     Comments: Unable to assess bowel sounds via virtual visit.  Skin:    Coloration: Skin is not jaundiced or pale.     Findings: No erythema.  Neurological:     Mental Status: She is alert and oriented to person, place, and time.  Psychiatric:        Mood and Affect: Mood normal.        Behavior: Behavior normal.        Thought Content: Thought content normal.        Judgment: Judgment normal.        Assessment & Plan    1. Attention deficit hyperactivity disorder (ADHD), unspecified ADHD type Chronic, stable.  We will continue twice daily dosing of Adderall 20 mg for now.  PDMP reviewed and appropriate, refills given.  Referral for psychiatry placed today.  Follow-up in 3 months. - Ambulatory referral to Psychiatry  2. Polyarthralgia Chronic, ongoing.  Patient reports was recently diagnosed with fibromyalgia-is following with neurologist and pain management clinic in [facility].  Referral also placed for chiropractic support.  continue this collaboration.  Referral was placed today for neurology and pain  management.  Follow-up in 3 months or sooner if needs arise. - Ambulatory referral to Pain Clinic - Ambulatory referral to Chiropractic  3. Degenerative disc disease, cervical Chronic, ongoing.  Patient reports was recently diagnosed with fibromyalgia-is following with neurologist and pain management clinic in [facility].  Referral also placed for chiropractic support.  continue this collaboration.  Referral was placed today for neurology and pain management.  Follow-up in 3 months or sooner if needs arise. - Ambulatory referral to Neurology  4. Insomnia, unspecified type Chronic, worsening.  Will increase quetiapine to 200 mg nightly.  Refills given.  Monitor for benefit; with none, return to clinic.  Referral for psychiatry placed today.  Follow-up in 3 months  5. Recurrent boils  Chronic per patient.  Will place referral to dermatology per patient's request today. - Ambulatory referral to Dermatology   Return in about 3 months (around 11/22/2020) for chronic disease f/u in person.     I discussed the assessment and treatment plan with the patient. The patient was provided an opportunity to ask questions and all were answered. The patient agreed with the plan and demonstrated an understanding of the instructions.   The patient was advised to call back or seek an in-person evaluation if the symptoms worsen or if the condition fails to improve as anticipated.  I provided 30+ minutes of non-face-to-face time during this encounter.  I have reviewed this encounter including the documentation in this note. I am certifying that I agree with the content of this note as primary care provider.   Eulogio Bear, NP [facility] [phone] (phone) [phone] (fax)  [place]

## 2020-08-22 NOTE — Assessment & Plan Note (Addendum)
Chronic, stable.  We will continue twice daily dosing of Adderall 20 mg for now.  PDMP reviewed and appropriate, refills given.  Referral for psychiatry placed today.  Follow-up in 3 months.

## 2020-08-22 NOTE — Assessment & Plan Note (Addendum)
Chronic, ongoing.  Patient reports was recently diagnosed with fibromyalgia-is following with neurologist and pain management clinic in [facility].  Referral also placed for chiropractic support.  continue this collaboration.  Referral was placed today for neurology and pain management.  Follow-up in 3 months or sooner if needs arise.

## 2020-08-22 NOTE — Assessment & Plan Note (Signed)
Chronic, worsening.  Will increase quetiapine to 200 mg nightly.  Refills given.  Monitor for benefit; with none, return to clinic.  Referral for psychiatry placed today.  Follow-up in 3 months

## 2020-08-22 NOTE — Assessment & Plan Note (Signed)
Chronic per patient.  Will place referral to dermatology per patient's request today.

## 2020-08-25 DIAGNOSIS — R0602 Shortness of breath: Secondary | ICD-10-CM | POA: Diagnosis not present

## 2020-08-25 DIAGNOSIS — F17210 Nicotine dependence, cigarettes, uncomplicated: Secondary | ICD-10-CM | POA: Diagnosis not present

## 2020-08-25 DIAGNOSIS — G8929 Other chronic pain: Secondary | ICD-10-CM | POA: Diagnosis not present

## 2020-08-25 DIAGNOSIS — Z79899 Other long term (current) drug therapy: Secondary | ICD-10-CM | POA: Diagnosis not present

## 2020-08-25 DIAGNOSIS — M549 Dorsalgia, unspecified: Secondary | ICD-10-CM | POA: Diagnosis not present

## 2020-08-26 DIAGNOSIS — M9902 Segmental and somatic dysfunction of thoracic region: Secondary | ICD-10-CM | POA: Diagnosis not present

## 2020-08-26 DIAGNOSIS — M9901 Segmental and somatic dysfunction of cervical region: Secondary | ICD-10-CM | POA: Diagnosis not present

## 2020-08-26 DIAGNOSIS — M5412 Radiculopathy, cervical region: Secondary | ICD-10-CM | POA: Diagnosis not present

## 2020-08-26 DIAGNOSIS — M5033 Other cervical disc degeneration, cervicothoracic region: Secondary | ICD-10-CM | POA: Diagnosis not present

## 2020-08-28 DIAGNOSIS — M5412 Radiculopathy, cervical region: Secondary | ICD-10-CM | POA: Diagnosis not present

## 2020-08-28 DIAGNOSIS — R531 Weakness: Secondary | ICD-10-CM | POA: Diagnosis not present

## 2020-08-28 DIAGNOSIS — R200 Anesthesia of skin: Secondary | ICD-10-CM | POA: Diagnosis not present

## 2020-08-28 DIAGNOSIS — M5033 Other cervical disc degeneration, cervicothoracic region: Secondary | ICD-10-CM | POA: Diagnosis not present

## 2020-08-28 DIAGNOSIS — M9901 Segmental and somatic dysfunction of cervical region: Secondary | ICD-10-CM | POA: Diagnosis not present

## 2020-08-28 DIAGNOSIS — R4189 Other symptoms and signs involving cognitive functions and awareness: Secondary | ICD-10-CM | POA: Diagnosis not present

## 2020-08-28 DIAGNOSIS — M797 Fibromyalgia: Secondary | ICD-10-CM | POA: Diagnosis not present

## 2020-08-28 DIAGNOSIS — M9902 Segmental and somatic dysfunction of thoracic region: Secondary | ICD-10-CM | POA: Diagnosis not present

## 2020-08-30 ENCOUNTER — Other Ambulatory Visit: Payer: Self-pay | Admitting: Family Medicine

## 2020-08-30 DIAGNOSIS — E559 Vitamin D deficiency, unspecified: Secondary | ICD-10-CM

## 2020-08-30 NOTE — Telephone Encounter (Signed)
Requested medication (s) are due for refill today: yes  Requested medication (s) are on the active medication list: yes  Last refill:  08/06/20 30.5 g 1 RF  Future visit scheduled: no  Notes to clinic:  med not delegated to NT to RF   Requested Prescriptions  Pending Prescriptions Disp Refills   Cholecalciferol (VITAMIN D3) 1.25 MG (50000 UT) CAPS [Pharmacy Med Name: VITAMIN D3 50,000 IU (CHOLE) CAP] 12 capsule     Sig: TAKE ONE CAPSULE BY MOUTH ONCE A WEEK FOR 8 WEEKS, THEN STOP. RETURN TO OFFICE FOR LAB DRAW      Endocrinology:  Vitamins - Vitamin D Supplementation Failed - 08/30/2020 11:34 AM      Failed - 50,000 IU strengths are not delegated      Failed - Phosphate in normal range and within 360 days    No results found for: PHOS        Failed - Vitamin D in normal range and within 360 days    Vit D, 25-Hydroxy  Date Value Ref Range Status  12/18/2019 15.5 (L) 30.0 - 100.0 ng/mL Final    Comment:    Vitamin D deficiency has been defined by the Institute of Medicine and an Endocrine Society practice guideline as a level of serum 25-OH vitamin D less than 20 ng/mL (1,2). The Endocrine Society went on to further define vitamin D insufficiency as a level between 21 and 29 ng/mL (2). 1. IOM (Institute of Medicine). 2010. Dietary reference    intakes for calcium and D. [place]: The    [employer]. 2. Holick MF, Binkley [place], Bischoff-Ferrari HA, et al.    Evaluation, treatment, and prevention of vitamin D    deficiency: an Endocrine Society clinical practice    guideline. JCEM. 2011 Jul; 96(7):1911-30.           Passed - Ca in normal range and within 360 days    Calcium  Date Value Ref Range Status  12/18/2019 9.8 8.7 - 10.2 mg/dL Final          Passed - Valid encounter within last 12 months    Recent Outpatient Visits           1 week ago Attention deficit hyperactivity disorder (ADHD), unspecified ADHD type   [facility] Eulogio Bear, NP   3 months ago Attention deficit hyperactivity disorder (ADHD), unspecified ADHD type   [place], [place]   3 months ago Nausea vomiting and diarrhea   [place], [place]   4 months ago BV (bacterial vaginosis)   [place], [place] T, NP   4 months ago Obesity (BMI 30-39.9)   [facility] Volney American, [place]       Future Appointments             In 2 months Laurence Ferrari, [place], MD [place]

## 2020-09-01 ENCOUNTER — Encounter: Payer: Self-pay | Admitting: Nurse Practitioner

## 2020-09-01 DIAGNOSIS — M79672 Pain in left foot: Secondary | ICD-10-CM | POA: Diagnosis not present

## 2020-09-01 DIAGNOSIS — M6740 Ganglion, unspecified site: Secondary | ICD-10-CM | POA: Diagnosis not present

## 2020-09-01 DIAGNOSIS — G629 Polyneuropathy, unspecified: Secondary | ICD-10-CM | POA: Diagnosis not present

## 2020-09-01 DIAGNOSIS — M205X2 Other deformities of toe(s) (acquired), left foot: Secondary | ICD-10-CM | POA: Diagnosis not present

## 2020-09-02 DIAGNOSIS — M9902 Segmental and somatic dysfunction of thoracic region: Secondary | ICD-10-CM | POA: Diagnosis not present

## 2020-09-02 DIAGNOSIS — M9901 Segmental and somatic dysfunction of cervical region: Secondary | ICD-10-CM | POA: Diagnosis not present

## 2020-09-02 DIAGNOSIS — M5412 Radiculopathy, cervical region: Secondary | ICD-10-CM | POA: Diagnosis not present

## 2020-09-02 DIAGNOSIS — M5033 Other cervical disc degeneration, cervicothoracic region: Secondary | ICD-10-CM | POA: Diagnosis not present

## 2020-09-08 DIAGNOSIS — M9901 Segmental and somatic dysfunction of cervical region: Secondary | ICD-10-CM | POA: Diagnosis not present

## 2020-09-08 DIAGNOSIS — M9902 Segmental and somatic dysfunction of thoracic region: Secondary | ICD-10-CM | POA: Diagnosis not present

## 2020-09-08 DIAGNOSIS — M5412 Radiculopathy, cervical region: Secondary | ICD-10-CM | POA: Diagnosis not present

## 2020-09-08 DIAGNOSIS — M5033 Other cervical disc degeneration, cervicothoracic region: Secondary | ICD-10-CM | POA: Diagnosis not present

## 2020-09-11 DIAGNOSIS — M9901 Segmental and somatic dysfunction of cervical region: Secondary | ICD-10-CM | POA: Diagnosis not present

## 2020-09-11 DIAGNOSIS — M5033 Other cervical disc degeneration, cervicothoracic region: Secondary | ICD-10-CM | POA: Diagnosis not present

## 2020-09-11 DIAGNOSIS — M5412 Radiculopathy, cervical region: Secondary | ICD-10-CM | POA: Diagnosis not present

## 2020-09-11 DIAGNOSIS — M9902 Segmental and somatic dysfunction of thoracic region: Secondary | ICD-10-CM | POA: Diagnosis not present

## 2020-09-12 ENCOUNTER — Encounter: Payer: Self-pay | Admitting: Nurse Practitioner

## 2020-09-12 DIAGNOSIS — M9901 Segmental and somatic dysfunction of cervical region: Secondary | ICD-10-CM | POA: Diagnosis not present

## 2020-09-12 DIAGNOSIS — M5412 Radiculopathy, cervical region: Secondary | ICD-10-CM | POA: Diagnosis not present

## 2020-09-12 DIAGNOSIS — M9902 Segmental and somatic dysfunction of thoracic region: Secondary | ICD-10-CM | POA: Diagnosis not present

## 2020-09-12 DIAGNOSIS — M5033 Other cervical disc degeneration, cervicothoracic region: Secondary | ICD-10-CM | POA: Diagnosis not present

## 2020-09-12 DIAGNOSIS — E559 Vitamin D deficiency, unspecified: Secondary | ICD-10-CM

## 2020-09-15 ENCOUNTER — Telehealth: Payer: Self-pay

## 2020-09-15 NOTE — Telephone Encounter (Signed)
Please Advise.  KP

## 2020-09-15 NOTE — Telephone Encounter (Signed)
PA for Mesalamine initiated and submitted via Cover My Meds. Key: [id number]

## 2020-09-15 NOTE — Telephone Encounter (Signed)
PA approved.

## 2020-09-16 ENCOUNTER — Other Ambulatory Visit: Payer: Self-pay | Admitting: Family Medicine

## 2020-09-16 DIAGNOSIS — M9901 Segmental and somatic dysfunction of cervical region: Secondary | ICD-10-CM | POA: Diagnosis not present

## 2020-09-16 DIAGNOSIS — M5033 Other cervical disc degeneration, cervicothoracic region: Secondary | ICD-10-CM | POA: Diagnosis not present

## 2020-09-16 DIAGNOSIS — M9902 Segmental and somatic dysfunction of thoracic region: Secondary | ICD-10-CM | POA: Diagnosis not present

## 2020-09-16 DIAGNOSIS — M5412 Radiculopathy, cervical region: Secondary | ICD-10-CM | POA: Diagnosis not present

## 2020-09-16 NOTE — Telephone Encounter (Signed)
Requested medication (s) are due for refill today: yes  Requested medication (s) are on the active medication list: yes  Last refill:  05/22/20  #90 1 refill  Future visit scheduled:No  Notes to clinic:  Not delegated    Requested Prescriptions  Pending Prescriptions Disp Refills   methocarbamol (ROBAXIN) 500 MG tablet [Pharmacy Med Name: METHOCARBAMOL 500MG TABLETS] 90 tablet 1    Sig: TAKE 1 TABLET(500 MG) BY MOUTH EVERY 8 HOURS AS NEEDED FOR MUSCLE SPASMS      Not Delegated - Analgesics:  Muscle Relaxants Failed - 09/16/2020  4:01 PM      Failed - This refill cannot be delegated      Passed - Valid encounter within last 6 months    Recent Outpatient Visits           3 weeks ago Attention deficit hyperactivity disorder (ADHD), unspecified ADHD type   [facility] Eulogio Bear, NP   3 months ago Attention deficit hyperactivity disorder (ADHD), unspecified ADHD type   [place], [place]   4 months ago Nausea vomiting and diarrhea   [place], [place]   4 months ago BV (bacterial vaginosis)   [place], [place] T, NP   5 months ago Obesity (BMI 30-39.9)   [facility] Vol[place] American, [place]       Future Appointments             In 2 months Laurence Ferrari, [place], MD [place]

## 2020-09-17 NOTE — Telephone Encounter (Signed)
Previous RL patient. Last seen 08/22/20 by Janett Billow.

## 2020-09-18 DIAGNOSIS — M5412 Radiculopathy, cervical region: Secondary | ICD-10-CM | POA: Diagnosis not present

## 2020-09-18 DIAGNOSIS — M5033 Other cervical disc degeneration, cervicothoracic region: Secondary | ICD-10-CM | POA: Diagnosis not present

## 2020-09-18 DIAGNOSIS — M9901 Segmental and somatic dysfunction of cervical region: Secondary | ICD-10-CM | POA: Diagnosis not present

## 2020-09-18 DIAGNOSIS — M9902 Segmental and somatic dysfunction of thoracic region: Secondary | ICD-10-CM | POA: Diagnosis not present

## 2020-09-22 DIAGNOSIS — M545 Low back pain, unspecified: Secondary | ICD-10-CM | POA: Diagnosis not present

## 2020-09-29 DIAGNOSIS — M549 Dorsalgia, unspecified: Secondary | ICD-10-CM | POA: Diagnosis not present

## 2020-09-29 DIAGNOSIS — F17210 Nicotine dependence, cigarettes, uncomplicated: Secondary | ICD-10-CM | POA: Diagnosis not present

## 2020-09-29 DIAGNOSIS — R0602 Shortness of breath: Secondary | ICD-10-CM | POA: Diagnosis not present

## 2020-09-29 DIAGNOSIS — Z79899 Other long term (current) drug therapy: Secondary | ICD-10-CM | POA: Diagnosis not present

## 2020-09-29 DIAGNOSIS — G8929 Other chronic pain: Secondary | ICD-10-CM | POA: Diagnosis not present

## 2020-10-09 ENCOUNTER — Encounter: Payer: Self-pay | Admitting: Nurse Practitioner

## 2020-10-09 DIAGNOSIS — R29898 Other symptoms and signs involving the musculoskeletal system: Secondary | ICD-10-CM | POA: Diagnosis not present

## 2020-10-09 DIAGNOSIS — R4189 Other symptoms and signs involving cognitive functions and awareness: Secondary | ICD-10-CM | POA: Diagnosis not present

## 2020-10-09 DIAGNOSIS — R200 Anesthesia of skin: Secondary | ICD-10-CM | POA: Diagnosis not present

## 2020-10-09 DIAGNOSIS — M797 Fibromyalgia: Secondary | ICD-10-CM | POA: Diagnosis not present

## 2020-10-13 ENCOUNTER — Other Ambulatory Visit: Payer: Self-pay

## 2020-10-13 ENCOUNTER — Telehealth (INDEPENDENT_AMBULATORY_CARE_PROVIDER_SITE_OTHER): Payer: Medicaid Other | Admitting: Nurse Practitioner

## 2020-10-13 NOTE — Progress Notes (Signed)
Erroneous encounter

## 2020-10-19 ENCOUNTER — Other Ambulatory Visit: Payer: Self-pay | Admitting: Family Medicine

## 2020-10-20 ENCOUNTER — Encounter: Payer: Self-pay | Admitting: Nurse Practitioner

## 2020-10-20 NOTE — Telephone Encounter (Signed)
Needs appt for Vitamin D f/u and to discuss weight loss

## 2020-10-20 NOTE — Telephone Encounter (Signed)
Requested medication (s) are due for refill today: no  Requested medication (s) are on the active medication list: yes   Last refill:  07/14/2020  Future visit scheduled: no  Notes to clinic:  this refill cannot be delegated    Requested Prescriptions  Pending Prescriptions Disp Refills   ondansetron (ZOFRAN-ODT) 4 MG disintegrating tablet [Pharmacy Med Name: ONDANSETRON ODT 4MG  TABLETS] 40 tablet 0    Sig: DISSOLVE 1 TABLET(4 MG) ON THE TONGUE EVERY 8 HOURS AS NEEDED      Not Delegated - Gastroenterology: Antiemetics Failed - 10/19/2020  9:29 PM      Failed - This refill cannot be delegated      Passed - Valid encounter within last 6 months    Recent Outpatient Visits           1 week ago Erroneous encounter - disregard   [facility] Eulogio Bear, NP   1 month ago Attention deficit hyperactivity disorder (ADHD), unspecified ADHD type   [facility] Eulogio Bear, NP   5 months ago Attention deficit hyperactivity disorder (ADHD), unspecified ADHD type   [place], [place]   5 months ago Nausea vomiting and diarrhea   [place], [place]   5 months ago BV (bacterial vaginosis)   [place], Barbaraann Faster, NP       Future Appointments             In 3 months [facility], [place], MD [place]

## 2020-10-22 ENCOUNTER — Encounter: Payer: Self-pay | Admitting: Nurse Practitioner

## 2020-10-22 DIAGNOSIS — F413 Other mixed anxiety disorders: Secondary | ICD-10-CM

## 2020-10-22 DIAGNOSIS — M797 Fibromyalgia: Secondary | ICD-10-CM

## 2020-10-23 DIAGNOSIS — M5033 Other cervical disc degeneration, cervicothoracic region: Secondary | ICD-10-CM | POA: Diagnosis not present

## 2020-10-23 DIAGNOSIS — M9901 Segmental and somatic dysfunction of cervical region: Secondary | ICD-10-CM | POA: Diagnosis not present

## 2020-10-23 DIAGNOSIS — M9902 Segmental and somatic dysfunction of thoracic region: Secondary | ICD-10-CM | POA: Diagnosis not present

## 2020-10-23 DIAGNOSIS — M5412 Radiculopathy, cervical region: Secondary | ICD-10-CM | POA: Diagnosis not present

## 2020-10-23 NOTE — Telephone Encounter (Signed)
Referrals placed to both [facility] and [facility]

## 2020-10-28 ENCOUNTER — Ambulatory Visit (INDEPENDENT_AMBULATORY_CARE_PROVIDER_SITE_OTHER): Payer: Medicaid Other | Admitting: Family Medicine

## 2020-10-28 ENCOUNTER — Other Ambulatory Visit: Payer: Self-pay

## 2020-10-28 ENCOUNTER — Encounter: Payer: Self-pay | Admitting: Family Medicine

## 2020-10-28 VITALS — BP 111/77 | HR 99 | Temp 98.1°F | Ht 63.66 in | Wt 233.0 lb

## 2020-10-28 DIAGNOSIS — E669 Obesity, unspecified: Secondary | ICD-10-CM

## 2020-10-28 DIAGNOSIS — M797 Fibromyalgia: Secondary | ICD-10-CM

## 2020-10-28 DIAGNOSIS — R918 Other nonspecific abnormal finding of lung field: Secondary | ICD-10-CM

## 2020-10-28 DIAGNOSIS — N926 Irregular menstruation, unspecified: Secondary | ICD-10-CM

## 2020-10-28 DIAGNOSIS — R635 Abnormal weight gain: Secondary | ICD-10-CM

## 2020-10-28 DIAGNOSIS — R5383 Other fatigue: Secondary | ICD-10-CM | POA: Diagnosis not present

## 2020-10-28 DIAGNOSIS — E559 Vitamin D deficiency, unspecified: Secondary | ICD-10-CM | POA: Diagnosis not present

## 2020-10-28 LAB — BAYER DCA HB A1C WAIVED: HB A1C (BAYER DCA - WAIVED): 5.4 % (ref <7.0)

## 2020-10-28 LAB — PREGNANCY, URINE: Preg Test, Ur: NEGATIVE

## 2020-10-28 NOTE — Patient Instructions (Addendum)
It was great to see you!  Our plans for today:  - See below for diet recommendations. - Talk to your neurologist about some of your medications that could be contributing to your weight gain.  - We are checking some labs today, we will release these to your MyChart.  - Follow up in 1-2 months.  Take care and seek immediate care sooner if you develop any concerns.   Dr. Ky Barban  Here is an example of what a healthy plate looks like:    ? Make half your plate fruits and vegetables.     ? Focus on whole fruits.     ? Vary your veggies.  ? Make half your grains whole grains. -     ? Look for the word "whole" at the beginning of the ingredients list    ? Some whole-grain ingredients include whole oats, whole-wheat flour,        whole-grain corn, whole-grain brown rice, and whole rye.  ? Move to low-fat and fat-free milk or yogurt.  ? Vary your protein routine. - Meat, fish, poultry (chicken, [place]), eggs, beans (kidney, pinto), dairy.  ? Drink and eat less sodium, saturated fat, and added sugars.

## 2020-10-28 NOTE — Assessment & Plan Note (Signed)
Recheck labs 

## 2020-10-28 NOTE — Assessment & Plan Note (Addendum)
14lb weight gain in 5 months. Likely exacerbated by inactivity, depression and few medications for fibromyalgia that may also be contributing, recommended discussing with neurologist and psychiatrist. Will check labs today. Continue metformin. Focus on continued diet and exercise efforts, handout provided. Consider referral to nutritionist or medical weight management pending labs.

## 2020-10-28 NOTE — Progress Notes (Signed)
   SUBJECTIVE:   CHIEF COMPLAINT / HPI: vit D f/u, weight concerns  Past Medical History:  Diagnosis Date  . ADHD (attention deficit hyperactivity disorder)   . Anxiety and depression 09/18/2017  . Controlled substance agreement broken 01/30/2018  . DDD (degenerative disc disease), cervical    and lumbar, mid back  . Degenerative disc disease, cervical   . History of stomach ulcers   . 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 Hydronephrosis, left 11/06/2016  . Myalgia   . Plantar fasciitis   . Polyarthralgia 08/31/2017  . Polycystic disease, ovaries   . Pulmonary nodules 08/25/2017   right lung, repeat imaging in 1 year  . Ulcerative colitis ([place])    Vitamin D deficiency follow-up - previously on 50k IU weekly - Diet: doesn't really eat fish or eggs - no fractures or broken bones - still menstruating though infrequent, see below.  Infrequent menses - first noticed after getting first dose of COVID vaccine ~3 months ago. - typically every 28 days, 4 days. Light flow. - still on OCPs, no missed doses.  - first cycle after vaccination was light, next cycle only spotting. Due to start yesterday, but hasn't started yet.  - currently sexually active. - mom was over 30 with onset of menopause. - some hot flashes for about 1 year.  - has h/o PCOS. - no cramping, abnl discharge, fevers - some weight gain recently - no FH of rheumatoid, lupus. - no UC flares recently  WEIGHT GAIN Duration: ~1 year Previous attempts at weight loss: yes, intermittent fasting.  Difficult to exercise d/t pain, recently referred to PT to help with increased activity. Sees psych next month. Complications of obesity: PCOS Peak weight: 221lb 04/2020 Weight loss goal: <200lbs Weight loss to date: none, currently at heaviest. Current weight loss supplements/medications: metformin Previous weight loss supplements/meds: topamax H/o UC, on mesalamine. Sees neuro and psych for fibromyalgia.   OBJECTIVE:   BP  111/77   Pulse 99   Temp 98.1 F (36.7 C)   Ht 5' 3.66" (1.617 m)   Wt 233 lb (105.7 kg)   SpO2 98%   BMI 40.42 kg/m   Gen: obese, in NAD Cardiac: RRR, no murmur Lungs: CTAB Abd: soft, nonTTP, +BS Ext: WWP, no edema  ASSESSMENT/PLAN:   Obesity (BMI 30-39.9) 14lb weight gain in 5 months. Likely exacerbated by inactivity, depression and few medications for fibromyalgia that may also be contributing, recommended discussing with neurologist and psychiatrist. Will check labs today. Continue metformin. Focus on continued diet and exercise efforts, handout provided. Consider referral to nutritionist or medical weight management pending labs.   Abnormal menses ~3 month duration. UPreg negative. Recent weight gain likely contributing. Will check labs to assess for thyroid abnormalities, infection, hormone levels, await results.  Vitamin D deficiency Recheck labs.   Myles Gip, DO

## 2020-10-28 NOTE — Assessment & Plan Note (Signed)
~  3 month duration. UPreg negative. Recent weight gain likely contributing. Will check labs to assess for thyroid abnormalities, infection, hormone levels, await results.

## 2020-10-29 LAB — LIPID PANEL
Chol/HDL Ratio: 5.8 ratio — ABNORMAL HIGH (ref 0.0–4.4)
Cholesterol, Total: 259 mg/dL — ABNORMAL HIGH (ref 100–199)
HDL: 45 mg/dL (ref >39)
LDL Chol Calc (NIH): 179 mg/dL — ABNORMAL HIGH (ref 0–99)
Triglycerides: 186 mg/dL — ABNORMAL HIGH (ref 0–149)
VLDL Cholesterol Cal: 35 mg/dL (ref 5–40)

## 2020-10-29 LAB — CBC WITH DIFFERENTIAL
Basophils Absolute: 0 10*3/uL (ref 0.0–0.2)
Basos: 0 %
EOS (ABSOLUTE): 0.2 10*3/uL (ref 0.0–0.4)
Eos: 3 %
Hematocrit: 47.6 % — ABNORMAL HIGH (ref 34.0–46.6)
Hemoglobin: 16.4 g/dL — ABNORMAL HIGH (ref 11.1–15.9)
Immature Grans (Abs): 0 10*3/uL (ref 0.0–0.1)
Immature Granulocytes: 0 %
Lymphocytes Absolute: 2.5 10*3/uL (ref 0.7–3.1)
Lymphs: 29 %
MCH: 32.2 pg (ref 26.6–33.0)
MCHC: 34.5 g/dL (ref 31.5–35.7)
MCV: 93 fL (ref 79–97)
Monocytes Absolute: 0.7 10*3/uL (ref 0.1–0.9)
Monocytes: 9 %
Neutrophils Absolute: 4.9 10*3/uL (ref 1.4–7.0)
Neutrophils: 59 %
RBC: 5.1 x10E6/uL (ref 3.77–5.28)
RDW: 12.1 % (ref 11.7–15.4)
WBC: 8.4 10*3/uL (ref 3.4–10.8)

## 2020-10-29 LAB — FOLLICLE STIMULATING HORMONE: FSH: 8.2 m[IU]/mL

## 2020-10-29 LAB — COMPREHENSIVE METABOLIC PANEL
ALT: 34 IU/L — ABNORMAL HIGH (ref 0–32)
AST: 35 IU/L (ref 0–40)
Albumin/Globulin Ratio: 1.8 (ref 1.2–2.2)
Albumin: 4.5 g/dL (ref 3.8–4.8)
Alkaline Phosphatase: 77 IU/L (ref 44–121)
BUN/Creatinine Ratio: 6 — ABNORMAL LOW (ref 9–23)
BUN: 6 mg/dL (ref 6–20)
Bilirubin Total: 0.2 mg/dL (ref 0.0–1.2)
CO2: 23 mmol/L (ref 20–29)
Calcium: 10 mg/dL (ref 8.7–10.2)
Chloride: 102 mmol/L (ref 96–106)
Creatinine, Ser: 0.94 mg/dL (ref 0.57–1.00)
GFR calc Af Amer: 88 mL/min/{1.73_m2} (ref >59)
GFR calc non Af Amer: 77 mL/min/{1.73_m2} (ref >59)
Globulin, Total: 2.5 g/dL (ref 1.5–4.5)
Glucose: 131 mg/dL — ABNORMAL HIGH (ref 65–99)
Potassium: 4.2 mmol/L (ref 3.5–5.2)
Sodium: 139 mmol/L (ref 134–144)
Total Protein: 7 g/dL (ref 6.0–8.5)

## 2020-10-29 LAB — TSH: TSH: 1.17 u[IU]/mL (ref 0.450–4.500)

## 2020-10-29 LAB — PROLACTIN: Prolactin: 17.5 ng/mL (ref 4.8–23.3)

## 2020-10-29 LAB — VITAMIN D 25 HYDROXY (VIT D DEFICIENCY, FRACTURES): Vit D, 25-Hydroxy: 43.5 ng/mL (ref 30.0–100.0)

## 2020-11-03 DIAGNOSIS — M549 Dorsalgia, unspecified: Secondary | ICD-10-CM | POA: Diagnosis not present

## 2020-11-03 DIAGNOSIS — E7800 Pure hypercholesterolemia, unspecified: Secondary | ICD-10-CM | POA: Diagnosis not present

## 2020-11-03 DIAGNOSIS — G8929 Other chronic pain: Secondary | ICD-10-CM | POA: Diagnosis not present

## 2020-11-03 DIAGNOSIS — E559 Vitamin D deficiency, unspecified: Secondary | ICD-10-CM | POA: Diagnosis not present

## 2020-11-03 DIAGNOSIS — F17210 Nicotine dependence, cigarettes, uncomplicated: Secondary | ICD-10-CM | POA: Diagnosis not present

## 2020-11-03 DIAGNOSIS — Z20822 Contact with and (suspected) exposure to covid-19: Secondary | ICD-10-CM | POA: Diagnosis not present

## 2020-11-03 DIAGNOSIS — R5383 Other fatigue: Secondary | ICD-10-CM | POA: Diagnosis not present

## 2020-11-03 DIAGNOSIS — R0602 Shortness of breath: Secondary | ICD-10-CM | POA: Diagnosis not present

## 2020-11-03 DIAGNOSIS — Z79899 Other long term (current) drug therapy: Secondary | ICD-10-CM | POA: Diagnosis not present

## 2020-11-11 DIAGNOSIS — M5033 Other cervical disc degeneration, cervicothoracic region: Secondary | ICD-10-CM | POA: Diagnosis not present

## 2020-11-11 DIAGNOSIS — M9901 Segmental and somatic dysfunction of cervical region: Secondary | ICD-10-CM | POA: Diagnosis not present

## 2020-11-11 DIAGNOSIS — M9902 Segmental and somatic dysfunction of thoracic region: Secondary | ICD-10-CM | POA: Diagnosis not present

## 2020-11-11 DIAGNOSIS — M5412 Radiculopathy, cervical region: Secondary | ICD-10-CM | POA: Diagnosis not present

## 2020-11-12 ENCOUNTER — Other Ambulatory Visit: Payer: Self-pay

## 2020-11-12 ENCOUNTER — Encounter: Payer: Self-pay | Admitting: Physical Therapy

## 2020-11-12 ENCOUNTER — Ambulatory Visit: Payer: Medicaid Other | Attending: Neurology | Admitting: Physical Therapy

## 2020-11-12 DIAGNOSIS — R262 Difficulty in walking, not elsewhere classified: Secondary | ICD-10-CM

## 2020-11-12 DIAGNOSIS — M6281 Muscle weakness (generalized): Secondary | ICD-10-CM | POA: Diagnosis not present

## 2020-11-12 DIAGNOSIS — R2681 Unsteadiness on feet: Secondary | ICD-10-CM | POA: Diagnosis not present

## 2020-11-12 NOTE — Therapy (Signed)
[place] MAIN [facility] SERVICES [address] [place], [place], [address] Phone: [phone]   Fax:  [phone]  Physical Therapy Evaluation  Patient Details  Name: Tracy Whitaker MRN: [id number] Date of Birth: 10-Jul-1981 Referring Provider (PT): Dr. Melrose Nakayama MD   Encounter Date: 11/12/2020   PT End of Session - 11/12/20 1506    Visit Number 1    Number of Visits 9    Date for PT Re-Evaluation 01/07/21    Authorization Type medicaid    PT Start Time 1432    PT Stop Time 1505    PT Time Calculation (min) 33 min    Activity Tolerance Patient tolerated treatment well    Behavior During Therapy [facility] for tasks assessed/performed           Past Medical History:  Diagnosis Date  . ADHD (attention deficit hyperactivity disorder)   . Anxiety and depression 09/18/2017  . Controlled substance agreement broken 01/30/2018  . DDD (degenerative disc disease), cervical    and lumbar, mid back  . Degenerative disc disease, cervical   . History of stomach ulcers   . 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 Hydronephrosis, left 11/06/2016  . Myalgia   . Plantar fasciitis   . Polyarthralgia 08/31/2017  . Polycystic disease, ovaries   . Pulmonary nodules 08/25/2017   right lung, repeat imaging in 1 year  . Ulcerative colitis [facility]     Past Surgical History:  Procedure Laterality Date  . CYSTOSCOPY WITH STENT PLACEMENT Left 11/06/2016   Procedure: CYSTOSCOPY WITH STENT PLACEMENT;  Surgeon: Nickie Retort, MD;  Location: [facility] ORS;  Service: Urology;  Laterality: Left;  . CYSTOSCOPY/URETEROSCOPY/HOLMIUM LASER/STENT PLACEMENT Left 11/27/2016   Procedure: CYSTOSCOPY/URETEROSCOPY/HOLMIUM LASER/STENT  rePLACEMENT;  Surgeon: Hollice Espy, MD;  Location: [facility] ORS;  Service: Urology;  Laterality: Left;  . PILONIDAL CYST EXCISION  2001  . PILONIDAL CYST EXCISION     several times    There were no vitals filed for this visit.    Subjective Assessment - 11/12/20  1428    Subjective "I have fibromyalgia. I have really weak legs and ankles and low energy all together." "I can't walk long periods of time."    Pertinent History 39 yo Female reports chronic pain and weakness which leads to gait instability; She reports her symptoms have been worsening in the last year. She presents to therapy without AD. She reports she has a SPC but states she hasn't started using it yet. She reports she will use it for long [employer] trips. She reports falling 1x in the last month. She reports her ankle rolled and she lost her balance. She denies any numbness/tingling; Pt reports significant difficulty sleeping. She reports she will sleep for 2-3 hours a time and then will wake up (unsure of cause); She reports she is just exhausted all day/everyday;    Limitations Standing;Walking    How long can you sit comfortably? never comfortable; 15-20 min;    How long can you stand comfortably? 5-10 min;    How long can you walk comfortably? 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 Yes    Pain Score  5     Pain Location Ankle    Pain Orientation Right;Left    Pain Descriptors / Indicators Aching;Throbbing    Pain Type Chronic pain    Pain Onset More than a month ago    Pain Frequency Intermittent    Aggravating Factors  any movement/activity    Pain Relieving Factors rest; ice helps the most;    Effect of Pain on Daily Activities decreased activity tolerance;    Multiple Pain Sites No              [facility] PT Assessment - 11/12/20 0001      Assessment   Medical Diagnosis weakness/gait instability    Referring Provider (PT) Dr. Melrose Nakayama MD    Onset Date/Surgical Date --   within the last year   Hand Dominance  Right    Next MD Visit --   none scheduled   Prior Therapy denies any PT for this condition;      Precautions   Precautions Fall    Required Braces or Orthoses Other Brace/Splint   has arthritis gloves and socks for feet; (tommy copper sleeve)     Restrictions   Weight Bearing Restrictions No      Balance Screen   Has the patient fallen in the past 6 months Yes    How many times? 1    Has the patient had a decrease in activity level because of a fear of falling?  No    Is the patient reluctant to leave their home because of a fear of falling?  No      Home Environment   Living Environment Private residence    Additional Comments Lives in an apartment with 2 flights of stairs to enter apartment (no elevator); she is able to negotiate steps reciprocally; single story apartment; has tub shower, requires increased time/effort for bathing/dressing, mod I ; no grab bars; cooks/cleans herself with increased breaks required;      Prior Function   Level of Independence Independent    Vocation Unemployed   stopped working June 16; terminated due to pain;   Vocation Requirements was working as a Art therapist, needs to be able to stand all day;    Leisure shoot pool;      Cognition   Overall Cognitive Status Within Functional Limits for tasks assessed      Observation/Other Assessments   Focus on Therapeutic Outcomes (FOTO)  47%      Sensation   Light Touch Appears Intact    Proprioception Appears Intact      Coordination   Gross Motor Movements are Fluid and Coordinated Yes    Fine Motor Movements are Fluid and Coordinated Yes    Finger Nose Finger Test accurate bilaterally    Heel Shin Test accurate bilaterally      Posture/Postural Control   Posture Comments sits with mild-moderate slumped posture, able to self correct with cues, repositions often due to back discomfort with prolonged sitting      ROM / Strength   AROM / PROM / Strength AROM;Strength      AROM   Overall  AROM Comments BUE and BLE are [facility]      Strength   Overall Strength Comments WFL except: ankle DF: 4-/5, PF: 2/5 (unable to complete single leg heel raise), IV/EV4-/5, left knee 4-/5      Special Tests   Other special tests SLS: 5-7 sec each LE with increased ankle instability noted      Transfers  Comments able to transfer without pushing on chair, but does report knee discomfort      Ambulation/Gait   Stairs Yes    Stairs Assistance 6: Modified independent (Device/Increase time)   uses B rails, reciprocal pattern   Gait Comments when ambulating, patient exhibits reciprocal gait pattern, does exhibit slight toe in on LLE with narrow base of support, able to achieve heel strike but exhibits foot slap with prolonged walking      Standardized Balance Assessment   Standardized Balance Assessment Five Times Sit to Stand;10 meter walk test    Five times sit to stand comments  9.2 sec without HHA, does report increased pain in knees, low risk for falls    10 Meter Walk 1.15 m/s without AD                      Objective measurements completed on examination: See above findings.               PT Education - 11/12/20 1506    Education Details recommendations    Person(s) Educated Patient    Methods Explanation    Comprehension Verbalized understanding            PT Short Term Goals - 11/12/20 1512      PT SHORT TERM GOAL #1   Title Patient will be adherent with HEP at least 3x a week to improve LE strength and mobility;    Baseline 12/22: does not have a HEP    Time 4    Period Weeks    Status New    Target Date 12/10/20      PT SHORT TERM GOAL #2   Title Patient will tolerate standing for 15 min without rest break for increased standing tolerance with ADLs including cooking/cleaning;    Baseline 12/22: only able to stand 5-10 min;    Time 4    Period Weeks    Status New    Target Date 12/10/20             PT Long Term Goals - 11/12/20 1513       PT LONG TERM GOAL #1   Title Patient will be able to hold SLS for >10 sec with good ankle stabilization for better static balance and to reduce fall risk with ADLs including getting in/out of tub.    Baseline 12/22: 5-7 sec with increased ankle instability;    Time 8    Period Weeks    Status New    Target Date 01/07/21      PT LONG TERM GOAL #2   Title Patient will improve FOTO score to >55% to indicate improved functional mobility with ADLs.    Baseline 12/22: 47%    Time 8    Period Weeks    Status New    Target Date 01/07/21      PT LONG TERM GOAL #3   Title Patient will improve BLE ankle strength to at least 4/5 to allow improved gait safety and standing balance.    Baseline 12/22: DF: 4-/5, PF: 2/5, IV/EV: 4-/5    Time 8    Period Weeks    Status New    Target Date 01/07/21      PT LONG TERM GOAL #4   Title Patient will ambulate >10 min with minimal pain reported to indicate improved functional mobility with shopping and caring for self.    Baseline 12/22: limited to 5-10 min with severe pain  and fatigue;    Time 8    Period Weeks    Status New    Target Date 01/07/21                  Plan - 11/12/20 1426    Clinical Impression Statement 39 yo Female presents to therapy with increased weakness/instability in ankles with pain in hands/ankles today. She has a medical history signifcant for fibromyalgia and reports chronic pain and weakness/fatigue daily. Patient reports 1 fall in last few months as a result of ankle rolling. She does have a SPC which she does not use often except to walk long distances. Patient does exhibit increased weakness in LE ankles (2/5) and ambulates with good heel strike but heavy foot slap with narrow base of support. She was able to hold SLS for 5-7 sec but exhibits significant lateral instability with poor ankle control. She would benefit from skilled PT intervention to improve strength, balance and mobility for return to PLOF.     Personal Factors and Comorbidities Comorbidity 3+;Fitness;Time since onset of injury/illness/exacerbation    Comorbidities fibromyalgia, polyarthralgia, ADHD, anxiety/depression, DDD in spine, PCOS, HSV infection, hydronephrosis (left), chronic pain; smoker    Examination-Activity Limitations Locomotion Level;Squat;Stairs;Stand;Transfers    Examination-Participation Restrictions Cleaning;Community Activity;Occupation;Shop;Volunteer;Yard Work    Merchant [employer] officer Stable/Uncomplicated    Designer, jewellery Low    Rehab Potential Fair    PT Frequency 1x / week    PT Duration 8 weeks    PT Treatment/Interventions Cryotherapy;Moist Heat;Gait training;Stair training;Functional mobility training;Therapeutic activities;Therapeutic exercise;Balance training;Neuromuscular re-education;Patient/family education;Manual techniques;Energy conservation;Taping    PT Next Visit Plan initiate HEP- work on balance/ankle stabilization and strengthening    PT Home Exercise Plan will address next visit    Consulted and Agree with Plan of Care Patient           Patient will benefit from skilled therapeutic intervention in order to improve the following deficits and impairments:  Abnormal gait,Pain,Decreased mobility,Decreased activity tolerance,Decreased endurance,Decreased strength,Obesity,Difficulty walking,Decreased balance  Visit Diagnosis: Muscle weakness (generalized)  Difficulty in walking, not elsewhere classified  Unsteadiness on feet     Problem List Patient Active Problem List   Diagnosis Date Noted  . Abnormal menses 10/28/2020  . Vitamin D deficiency 10/28/2020  . Fibromyalgia 08/28/2020  . Recurrent boils 08/22/2020  . Degenerative disc disease, cervical   . Pain in joints of both feet 12/18/2019  . Fatigue 12/18/2019  . Anxiety disorder 05/04/2019  . Cigarette smoker 04/19/2019  . Depression 05/12/2018  . Controlled substance agreement broken 01/30/2018  .  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 Insomnia 09/18/2017  . Ulcerative colitis ([place]) 09/15/2017  . Polycystic disease, ovaries 09/15/2017  . Myalgia 08/31/2017  . Polyarthralgia 08/31/2017  . Pulmonary nodules 08/25/2017  . Hydronephrosis, left 11/06/2016  . ADHD (attention deficit hyperactivity disorder) 04/16/2016  . Obesity (BMI 30-39.9) 04/16/2016  . BV (bacterial vaginosis) 03/04/2016    Deandre Brannan PT, DPT 11/12/2020, 3:16 PM  [place] MAIN [facility] SERVICES [address] [place], [place], [address] Phone: [phone]   Fax:  [phone]  Name: NATTALIE MAGBANUA MRN: [id number] Date of Birth: 10/21/1981

## 2020-11-14 ENCOUNTER — Other Ambulatory Visit: Payer: Self-pay | Admitting: Family Medicine

## 2020-11-14 ENCOUNTER — Other Ambulatory Visit: Payer: Self-pay | Admitting: Nurse Practitioner

## 2020-11-19 ENCOUNTER — Ambulatory Visit: Payer: Medicaid Other | Admitting: Dermatology

## 2020-11-19 ENCOUNTER — Other Ambulatory Visit: Payer: Self-pay | Admitting: Family Medicine

## 2020-11-19 NOTE — Telephone Encounter (Signed)
Please refuse, pharmacy has a prescription getting it ready for her

## 2020-11-19 NOTE — Telephone Encounter (Signed)
Pt request refill   amphetamine-dextroamphetamine (ADDERALL) 20 MG   Pt just saw Dr Ky Barban on 12/07, but her Rx was not done.  appt made for Dr Ky Barban on 11/25/20. If pt doesn't need, please cancel.  [id number] - [place], [place]

## 2020-11-25 ENCOUNTER — Telehealth: Payer: Medicaid Other | Admitting: Family Medicine

## 2020-11-27 ENCOUNTER — Other Ambulatory Visit: Payer: Self-pay

## 2020-11-27 ENCOUNTER — Telehealth (INDEPENDENT_AMBULATORY_CARE_PROVIDER_SITE_OTHER): Payer: Medicaid Other | Admitting: Psychiatry

## 2020-11-27 DIAGNOSIS — F430 Acute stress reaction: Secondary | ICD-10-CM

## 2020-11-27 DIAGNOSIS — F3132 Bipolar disorder, current episode depressed, moderate: Secondary | ICD-10-CM

## 2020-11-27 NOTE — Progress Notes (Signed)
Patient had trouble connecting through video.  Advised to contact tech-support.  Patient advised to call writer back to complete the evaluation.  However if she continues to have connection problem will recommend rescheduling for next available.  I have discussed concerns with staff at front desk.  They will reschedule patient with Dr. Modesta Messing who has sooner availability.

## 2020-12-02 ENCOUNTER — Encounter: Payer: Self-pay | Admitting: Family Medicine

## 2020-12-02 DIAGNOSIS — M5033 Other cervical disc degeneration, cervicothoracic region: Secondary | ICD-10-CM | POA: Diagnosis not present

## 2020-12-02 DIAGNOSIS — M5412 Radiculopathy, cervical region: Secondary | ICD-10-CM | POA: Diagnosis not present

## 2020-12-02 DIAGNOSIS — M9901 Segmental and somatic dysfunction of cervical region: Secondary | ICD-10-CM | POA: Diagnosis not present

## 2020-12-02 DIAGNOSIS — M9902 Segmental and somatic dysfunction of thoracic region: Secondary | ICD-10-CM | POA: Diagnosis not present

## 2020-12-04 ENCOUNTER — Other Ambulatory Visit: Payer: Self-pay | Admitting: Dermatology

## 2020-12-04 ENCOUNTER — Other Ambulatory Visit: Payer: Self-pay

## 2020-12-04 MED ORDER — ACYCLOVIR 800 MG PO TABS: ORAL_TABLET | ORAL | 0 refills | Status: DC

## 2020-12-04 MED ORDER — METHOCARBAMOL 500 MG PO TABS: ORAL_TABLET | ORAL | 1 refills | Status: DC

## 2020-12-04 NOTE — Telephone Encounter (Signed)
Request for refill of Methocarbamol 500 mg, and Acyclovir 852m   LOV 10/28/20  Up coming OV No up coming appt noted in chart

## 2020-12-09 ENCOUNTER — Ambulatory Visit: Payer: Medicaid Other | Admitting: Physical Therapy

## 2020-12-10 DIAGNOSIS — Z79899 Other long term (current) drug therapy: Secondary | ICD-10-CM | POA: Diagnosis not present

## 2020-12-10 DIAGNOSIS — M549 Dorsalgia, unspecified: Secondary | ICD-10-CM | POA: Diagnosis not present

## 2020-12-10 DIAGNOSIS — R0602 Shortness of breath: Secondary | ICD-10-CM | POA: Diagnosis not present

## 2020-12-10 DIAGNOSIS — G8929 Other chronic pain: Secondary | ICD-10-CM | POA: Diagnosis not present

## 2020-12-10 DIAGNOSIS — F17210 Nicotine dependence, cigarettes, uncomplicated: Secondary | ICD-10-CM | POA: Diagnosis not present

## 2020-12-10 DIAGNOSIS — Z1159 Encounter for screening for other viral diseases: Secondary | ICD-10-CM | POA: Diagnosis not present

## 2020-12-12 ENCOUNTER — Other Ambulatory Visit: Payer: Self-pay | Admitting: Family Medicine

## 2020-12-12 ENCOUNTER — Other Ambulatory Visit: Payer: Self-pay | Admitting: Nurse Practitioner

## 2020-12-12 NOTE — Telephone Encounter (Signed)
Previous RL patient. Last seen 10/28/20

## 2020-12-12 NOTE — Telephone Encounter (Signed)
Requested medications are due for refill today yes  Requested medications are on the active medication list yes  Last refill 10/16  Last visit I do not see a visit addressing Med/Dx  Future visit scheduled no  Notes to clinic Please assess, could not find visit that addresses.

## 2020-12-12 NOTE — Telephone Encounter (Signed)
Previous RL patient. Last seen 10/28/20 °

## 2020-12-12 NOTE — Telephone Encounter (Signed)
Requested medication (s) are due for refill today: no  Requested medication (s) are on the active medication list:yes  Last refill:  07/22/2020  Future visit scheduled: no  Notes to clinic: this refill cannot be delegated    Requested Prescriptions  Pending Prescriptions Disp Refills   Mesalamine (ASACOL) 400 MG CPDR DR capsule [Pharmacy Med Name: MESALAMINE 400MG  DR CAPSULES] 180 capsule 0    Sig: TAKE 2 CAPSULES(800 MG) BY MOUTH TWICE DAILY      Not Delegated - Gastroenterology:  Inflammatory Bowel Disease (Oral) Failed - 12/12/2020  8:05 AM      Failed - This refill cannot be delegated      Failed - ALT in normal range and within 180 days    ALT  Date Value Ref Range Status  10/28/2020 34 (H) 0 - 32 IU/L Final          Failed - HGB in normal range and within 180 days    Hemoglobin  Date Value Ref Range Status  10/28/2020 16.4 (H) 11.1 - 15.9 g/dL Final          Failed - HCT in normal range and within 180 days    Hematocrit  Date Value Ref Range Status  10/28/2020 47.6 (H) 34.0 - 46.6 % Final          Failed - PLT in normal range and within 180 days    Platelets  Date Value Ref Range Status  12/18/2019 359 150 - 450 x10E3/uL Final          Passed - Cr in normal range and within 180 days    Creatinine, Ser  Date Value Ref Range Status  10/28/2020 0.94 0.57 - 1.00 mg/dL Final          Passed - AST in normal range and within 180 days    AST  Date Value Ref Range Status  10/28/2020 35 0 - 40 IU/L Final          Passed - WBC in normal range and within 180 days    WBC  Date Value Ref Range Status  10/28/2020 8.4 3.4 - 10.8 x10E3/uL Final  08/23/2017 10.8 3.6 - 11.0 K/uL Final          Passed - Valid encounter within last 6 months    Recent Outpatient Visits           1 month ago Weight gain   [facility] Myles Gip, DO   2 months ago Erroneous encounter - disregard   [facility] Eulogio Bear, NP   3 months  ago Attention deficit hyperactivity disorder (ADHD), unspecified ADHD type   [facility] Eulogio Bear, NP   6 months ago Attention deficit hyperactivity disorder (ADHD), unspecified ADHD type   [place], [place], [place]   7 months ago Nausea vomiting and diarrhea   [place], [place]ilia Argue, [place]       Future Appointments             In 1 month Laurence Ferrari, [place], MD [place]

## 2020-12-16 ENCOUNTER — Ambulatory Visit: Payer: Medicaid Other

## 2020-12-17 ENCOUNTER — Ambulatory Visit (INDEPENDENT_AMBULATORY_CARE_PROVIDER_SITE_OTHER): Payer: Medicaid Other | Admitting: Family Medicine

## 2020-12-17 ENCOUNTER — Encounter: Payer: Self-pay | Admitting: Family Medicine

## 2020-12-17 VITALS — Wt 224.3 lb

## 2020-12-17 DIAGNOSIS — F909 Attention-deficit hyperactivity disorder, unspecified type: Secondary | ICD-10-CM

## 2020-12-17 MED ORDER — AMPHETAMINE-DEXTROAMPHETAMINE 20 MG PO TABS: 20.0000 mg | ORAL_TABLET | Freq: Two times a day (BID) | ORAL | 0 refills | Status: DC

## 2020-12-17 NOTE — Assessment & Plan Note (Signed)
Doing well on current regimen without significant side effects, refills sent. Will be establishing with Psych soon.

## 2020-12-17 NOTE — Progress Notes (Signed)
Virtual Visit via Video Note  I connected with Tracy Whitaker on 12/17/20 at  1:00 PM EST by a video enabled telemedicine application and verified that I am speaking with the correct person using two identifiers.  Location: Patient: home Provider: [facility]   I discussed the limitations of evaluation and management by telemedicine and the availability of in person appointments. The patient expressed understanding and agreed to proceed.  History of Present Illness:  ADHD - previously seen by [facility], in the process of establishing with [facility] - Meds: adderall 59m BID - Last follow up: 08/22/20 - Denies loss of appetite, GI upset, new sleep disturbances, vision changes, behavior problems - Weight changes? Actively trying to lose weight - starting new job soon.    Observations/Objective:  Patient had trouble connecting to video visit, entirety of visit conducted over the phone.  Speaks in full sentences, no respiratory distress.  Assessment and Plan:  ADHD (attention deficit hyperactivity disorder) Doing well on current regimen without significant side effects, refills sent. Will be establishing with Psych soon.     I discussed the assessment and treatment plan with the patient. The patient was provided an opportunity to ask questions and all were answered. The patient agreed with the plan and demonstrated an understanding of the instructions.   The patient was advised to call back or seek an in-person evaluation if the symptoms worsen or if the condition fails to improve as anticipated.  I provided 7 minutes of non-face-to-face time during this encounter.   AMyles Gip DO

## 2020-12-17 NOTE — Patient Instructions (Signed)
It was great to see you! I'm glad you are doing well.   We sent refills of your adderall to the pharmacy.  Call [place] [facility] soon to get established. They will likely take over management of your ADHD, if not, come back in 3 months.  Take care and seek immediate care sooner if you develop any concerns.   Dr. Ky Barban

## 2020-12-23 ENCOUNTER — Ambulatory Visit: Payer: Medicaid Other

## 2020-12-26 ENCOUNTER — Other Ambulatory Visit: Payer: Self-pay | Admitting: Family Medicine

## 2020-12-30 ENCOUNTER — Ambulatory Visit: Payer: Medicaid Other

## 2021-01-06 ENCOUNTER — Ambulatory Visit: Payer: Medicaid Other | Attending: Neurology | Admitting: Physical Therapy

## 2021-01-09 DIAGNOSIS — R0602 Shortness of breath: Secondary | ICD-10-CM | POA: Diagnosis not present

## 2021-01-09 DIAGNOSIS — M549 Dorsalgia, unspecified: Secondary | ICD-10-CM | POA: Diagnosis not present

## 2021-01-09 DIAGNOSIS — F17210 Nicotine dependence, cigarettes, uncomplicated: Secondary | ICD-10-CM | POA: Diagnosis not present

## 2021-01-09 DIAGNOSIS — G8929 Other chronic pain: Secondary | ICD-10-CM | POA: Diagnosis not present

## 2021-01-12 DIAGNOSIS — R200 Anesthesia of skin: Secondary | ICD-10-CM | POA: Diagnosis not present

## 2021-01-12 DIAGNOSIS — R29898 Other symptoms and signs involving the musculoskeletal system: Secondary | ICD-10-CM | POA: Diagnosis not present

## 2021-01-12 DIAGNOSIS — R4189 Other symptoms and signs involving cognitive functions and awareness: Secondary | ICD-10-CM | POA: Diagnosis not present

## 2021-01-12 DIAGNOSIS — R208 Other disturbances of skin sensation: Secondary | ICD-10-CM | POA: Diagnosis not present

## 2021-01-13 ENCOUNTER — Ambulatory Visit: Payer: Medicaid Other | Admitting: Physical Therapy

## 2021-01-20 ENCOUNTER — Ambulatory Visit: Payer: Medicaid Other | Admitting: Physical Therapy

## 2021-01-21 ENCOUNTER — Ambulatory Visit: Payer: Medicaid Other | Admitting: Dermatology

## 2021-01-27 ENCOUNTER — Ambulatory Visit: Payer: Medicaid Other | Admitting: Physical Therapy

## 2021-02-02 ENCOUNTER — Other Ambulatory Visit: Payer: Self-pay | Admitting: Family Medicine

## 2021-02-02 DIAGNOSIS — M2550 Pain in unspecified joint: Secondary | ICD-10-CM

## 2021-02-08 ENCOUNTER — Other Ambulatory Visit: Payer: Self-pay | Admitting: Family Medicine

## 2021-02-08 NOTE — Telephone Encounter (Signed)
Requested medication (s) are due for refill today: yes  Requested medication (s) are on the active medication list: yes  Last refill:  12/12/20  Future visit scheduled: no  Notes to clinic:  med not delegated to NT to RF   Requested Prescriptions  Pending Prescriptions Disp Refills   DELZICOL 400 MG CPDR DR capsule [Pharmacy Med Name: DELZICOL 400MG CAPSULES] 180 capsule 0    Sig: TAKE 2 CAPSULES(800 MG) BY MOUTH TWICE DAILY      Not Delegated - Gastroenterology:  Inflammatory Bowel Disease (Oral) Failed - 02/08/2021 11:09 AM      Failed - This refill cannot be delegated      Failed - ALT in normal range and within 180 days    ALT  Date Value Ref Range Status  10/28/2020 34 (H) 0 - 32 IU/L Final          Failed - HGB in normal range and within 180 days    Hemoglobin  Date Value Ref Range Status  10/28/2020 16.4 (H) 11.1 - 15.9 g/dL Final          Failed - HCT in normal range and within 180 days    Hematocrit  Date Value Ref Range Status  10/28/2020 47.6 (H) 34.0 - 46.6 % Final          Failed - PLT in normal range and within 180 days    Platelets  Date Value Ref Range Status  12/18/2019 359 150 - 450 x10E3/uL Final          Passed - Cr in normal range and within 180 days    Creatinine, Ser  Date Value Ref Range Status  10/28/2020 0.94 0.57 - 1.00 mg/dL Final          Passed - AST in normal range and within 180 days    AST  Date Value Ref Range Status  10/28/2020 35 0 - 40 IU/L Final          Passed - WBC in normal range and within 180 days    WBC  Date Value Ref Range Status  10/28/2020 8.4 3.4 - 10.8 x10E3/uL Final  08/23/2017 10.8 3.6 - 11.0 K/uL Final          Passed - Valid encounter within last 6 months    Recent Outpatient Visits           1 month ago Attention deficit hyperactivity disorder (ADHD), unspecified ADHD type   [facility] Myles Gip, DO   3 months ago Weight gain   [facility] Myles Gip, DO   3 months ago Erroneous encounter - disregard   [facility] Eulogio Bear, NP   5 months ago Attention deficit hyperactivity disorder (ADHD), unspecified ADHD type   [facility] Eulogio Bear, NP   8 months ago Attention deficit hyperactivity disorder (ADHD), unspecified ADHD type   [facility], [place] Argue, [place]       Future Appointments             In 1 month Jon Billings, NP [facility], [place]

## 2021-02-09 DIAGNOSIS — G8929 Other chronic pain: Secondary | ICD-10-CM | POA: Diagnosis not present

## 2021-02-09 DIAGNOSIS — R0602 Shortness of breath: Secondary | ICD-10-CM | POA: Diagnosis not present

## 2021-02-09 DIAGNOSIS — R635 Abnormal weight gain: Secondary | ICD-10-CM | POA: Diagnosis not present

## 2021-02-09 DIAGNOSIS — Z79899 Other long term (current) drug therapy: Secondary | ICD-10-CM | POA: Diagnosis not present

## 2021-02-09 DIAGNOSIS — M549 Dorsalgia, unspecified: Secondary | ICD-10-CM | POA: Diagnosis not present

## 2021-02-09 DIAGNOSIS — E669 Obesity, unspecified: Secondary | ICD-10-CM | POA: Diagnosis not present

## 2021-02-09 DIAGNOSIS — F17210 Nicotine dependence, cigarettes, uncomplicated: Secondary | ICD-10-CM | POA: Diagnosis not present

## 2021-02-09 DIAGNOSIS — R002 Palpitations: Secondary | ICD-10-CM | POA: Diagnosis not present

## 2021-02-09 NOTE — Telephone Encounter (Signed)
Routing to provider  

## 2021-02-14 ENCOUNTER — Other Ambulatory Visit: Payer: Self-pay | Admitting: Nurse Practitioner

## 2021-02-19 ENCOUNTER — Ambulatory Visit: Payer: Self-pay | Admitting: *Deleted

## 2021-02-19 NOTE — Telephone Encounter (Signed)
Pt called in while sitting in the waiting room of the [facility] urgent care.  "I didn't know if I could be seen sooner in the office than here".   "There is a wait here".     She is c/o having a sore throat, productive cough of green/black mucus, shortness of breath, runny nose and very tired since last week.   "I just feel so bad".  She has not done a covid test.   I let her know she would need to do a video visit because of her symptoms she would not be seen in the office here at [facility].   There was an appt available today at 4:20 via video visit with Jon Billings, NP however pt decided to stay at the urgent care and be seen there.   "They can test me for covid here".   I let her know since she was there it was best that way they can listen to her lungs, do a covid test and maybe a chest x ray if the provider felt it was needed.   She was agreeable to this plan.  She thanked me for my help and stated,  I think it's best I stay here and be seen".    Reason for Disposition . [1] MILD difficulty breathing (e.g., minimal/no SOB at rest, SOB with walking, pulse <100) AND [2] NEW-onset or WORSE than normal  Answer Assessment - Initial Assessment Questions 1. RESPIRATORY STATUS: "Describe your breathing?" (e.g., wheezing, shortness of breath, unable to speak, severe coughing)      I'm having shortness of breath and I'm tired.   2. ONSET: "When did this breathing problem begin?"      2 weeks ago.    It started with a sore throat, runny nose and nasal congestion.   Not done a covid test.   3. PATTERN "Does the difficult breathing come and go, or has it been constant since it started?"      Comes and goes  I have fibromyalgia 4. SEVERITY: "How bad is your breathing?" (e.g., mild, moderate, severe)    - MILD: No SOB at rest, mild SOB with walking, speaks normally in sentences, can lay down, no retractions, pulse < 100.    - MODERATE: SOB at rest, SOB with minimal exertion and  prefers to sit, cannot lie down flat, speaks in phrases, mild retractions, audible wheezing, pulse 100-120.    - SEVERE: Very SOB at rest, speaks in single words, struggling to breathe, sitting hunched forward, retractions, pulse > 120      I'm very short of breath at work which I do a lot of exertion 5. RECURRENT SYMPTOM: "Have you had difficulty breathing before?" If Yes, ask: "When was the last time?" and "What happened that time?"      Any time I'm sick 6. CARDIAC HISTORY: "Do you have any history of heart disease?" (e.g., heart attack, angina, bypass surgery, angioplasty)      No 7. LUNG HISTORY: "Do you have any history of lung disease?"  (e.g., pulmonary embolus, asthma, emphysema)     No 8. CAUSE: "What do you think is causing the breathing problem?"      I was sick a couple of weeks ago 9. OTHER SYMPTOMS: "Do you have any other symptoms? (e.g., dizziness, runny nose, cough, chest pain, fever)     Sore throat, runny nose, coughing up green and black mucus.   I've not checked my fever. 10. PREGNANCY: "Is there  any chance you are pregnant?" "When was your last menstrual period?"       No 11. TRAVEL: "Have you traveled out of the country in the last month?" (e.g., travel history, exposures)       No exposed.  Protocols used: BREATHING DIFFICULTY-A-AH

## 2021-03-01 ENCOUNTER — Other Ambulatory Visit: Payer: Self-pay | Admitting: Nurse Practitioner

## 2021-03-01 NOTE — Telephone Encounter (Signed)
Requested medication (s) are due for refill today: -  Requested medication (s) are on the active medication list: no  Last refill:  05/02/20  Future visit scheduled: yes  Notes to clinic:  prescription ended 05/02/20 - med not assigned to a protocol   Requested Prescriptions  Pending Prescriptions Disp Refills   fluconazole (DIFLUCAN) 150 MG tablet [Pharmacy Med Name: FLUCONAZOLE 150MG TABLETS] 1 tablet 0    Sig: TAKE 1 TABLET(150 MG) BY MOUTH 1 TIME FOR 1 DOSE      Off-Protocol Failed - 03/01/2021 12:18 PM      Failed - Medication not assigned to a protocol, review manually.      Passed - Valid encounter within last 12 months    Recent Outpatient Visits           2 months ago Attention deficit hyperactivity disorder (ADHD), unspecified ADHD type   Bonnie, DO   4 months ago Weight gain   [facility] Myles Gip, DO   4 months ago Erroneous encounter - disregard   [facility] Eulogio Bear, NP   6 months ago Attention deficit hyperactivity disorder (ADHD), unspecified ADHD type   [facility] Eulogio Bear, NP   9 months ago Attention deficit hyperactivity disorder (ADHD), unspecified ADHD type   [facility] Volney American, [place]       Future Appointments             In 2 weeks Jon Billings, NP [facility], [place]

## 2021-03-02 NOTE — Telephone Encounter (Signed)
Unable to done message as order are unsigned.

## 2021-03-02 NOTE — Telephone Encounter (Signed)
Pt ok for waiting for refill until apt.

## 2021-03-02 NOTE — Telephone Encounter (Signed)
Per last office note pt was suppose to come back on Return in about 3 months (around 03/17/2021)

## 2021-03-03 DIAGNOSIS — Z79899 Other long term (current) drug therapy: Secondary | ICD-10-CM | POA: Diagnosis not present

## 2021-03-03 DIAGNOSIS — M549 Dorsalgia, unspecified: Secondary | ICD-10-CM | POA: Diagnosis not present

## 2021-03-03 DIAGNOSIS — G8929 Other chronic pain: Secondary | ICD-10-CM | POA: Diagnosis not present

## 2021-03-03 DIAGNOSIS — R635 Abnormal weight gain: Secondary | ICD-10-CM | POA: Diagnosis not present

## 2021-03-03 DIAGNOSIS — F17210 Nicotine dependence, cigarettes, uncomplicated: Secondary | ICD-10-CM | POA: Diagnosis not present

## 2021-03-16 NOTE — Progress Notes (Signed)
BP 105/84   Pulse 78   Temp 99.2 F (37.3 C)   Wt 211 lb (95.7 kg)   SpO2 98%   BMI 36.60 kg/m    Subjective:    Patient ID: Tracy Whitaker, female    DOB: 08-22-1981, 40 y.o.   MRN: [id number]  HPI: BERNEDETTE Whitaker is a 40 y.o. female  Chief Complaint  Patient presents with  . ADHD  . Vaginal Discharge    Recent antibiotic use    ADHD Patient here today to follow up on ADHD.  She has not established care with a new psychiatrist.  Patient states he ADHD is well controlled on current regimen. Pain management doctor is aware that she receives Adderrall. Denies concerns regarding ADHD at visit today.   YEAST INFECTION Patient states she recently finished an antibiotic from a sinus infection. Patient states she has milky white discharge. Has been using OTC yeast medication but it hasn't completely resolved the symptoms.  Patient is currently on her menstrual cycle.    Relevant past medical, surgical, family and social history reviewed and updated as indicated. Interim medical history since our last visit reviewed. Allergies and medications reviewed and updated.  Review of Systems  Genitourinary: Positive for vaginal discharge.  Psychiatric/Behavioral: Positive for decreased concentration.    Per HPI unless specifically indicated above     Objective:    BP 105/84   Pulse 78   Temp 99.2 F (37.3 C)   Wt 211 lb (95.7 kg)   SpO2 98%   BMI 36.60 kg/m   Wt Readings from Last 3 Encounters:  03/17/21 211 lb (95.7 kg)  12/17/20 224 lb 5 oz (101.7 kg)  10/28/20 233 lb (105.7 kg)    Physical Exam Vitals and nursing note reviewed.  Constitutional:      General: She is not in acute distress.    Appearance: Normal appearance. She is normal weight. She is not ill-appearing, toxic-appearing or diaphoretic.  HENT:     Head: Normocephalic.     Right Ear: External ear normal.     Left Ear: External ear normal.     Nose: Nose normal.     Mouth/Throat:     Mouth:  Mucous membranes are moist.     Pharynx: Oropharynx is clear.  Eyes:     General:        Right eye: No discharge.        Left eye: No discharge.     Extraocular Movements: Extraocular movements intact.     Conjunctiva/sclera: Conjunctivae normal.     Pupils: Pupils are equal, round, and reactive to light.  Cardiovascular:     Rate and Rhythm: Normal rate and regular rhythm.     Heart sounds: No murmur heard.   Pulmonary:     Effort: Pulmonary effort is normal. No respiratory distress.     Breath sounds: Normal breath sounds. No wheezing or rales.  Musculoskeletal:     Cervical back: Normal range of motion and neck supple.  Skin:    General: Skin is warm and dry.     Capillary Refill: Capillary refill takes less than 2 seconds.  Neurological:     General: No focal deficit present.     Mental Status: She is alert and oriented to person, place, and time. Mental status is at baseline.  Psychiatric:        Mood and Affect: Mood normal.        Behavior: Behavior normal.  Thought Content: Thought content normal.        Judgment: Judgment normal.     Results for orders placed or performed in visit on 10/28/20  VITAMIN D 25 Hydroxy (Vit-D Deficiency, Fractures)  Result Value Ref Range   Vit D, 25-Hydroxy 43.5 30.0 - 100.0 ng/mL  Lipid panel  Result Value Ref Range   Cholesterol, Total 259 (H) 100 - 199 mg/dL   Triglycerides 186 (H) 0 - 149 mg/dL   HDL 45 >39 mg/dL   VLDL Cholesterol Cal 35 5 - 40 mg/dL   LDL Chol Calc (NIH) 179 (H) 0 - 99 mg/dL   Chol/HDL Ratio 5.8 (H) 0.0 - 4.4 ratio  TSH  Result Value Ref Range   TSH 1.170 0.450 - 4.500 uIU/mL  Bayer DCA Hb A1c Waived (STAT)  Result Value Ref Range   HB A1C (BAYER DCA - WAIVED) 5.4 <7.0 %  CBC With Differential  Result Value Ref Range   WBC 8.4 3.4 - 10.8 x10E3/uL   RBC 5.10 3.77 - 5.28 x10E6/uL   Hemoglobin 16.4 (H) 11.1 - 15.9 g/dL   Hematocrit 47.6 (H) 34.0 - 46.6 %   MCV 93 79 - 97 fL   MCH 32.2 26.6 -  33.0 pg   MCHC 34.5 31.5 - 35.7 g/dL   RDW 12.1 11.7 - 15.4 %   Neutrophils 59 Not Estab. %   Lymphs 29 Not Estab. %   Monocytes 9 Not Estab. %   Eos 3 Not Estab. %   Basos 0 Not Estab. %   Neutrophils Absolute 4.9 1.4 - 7.0 x10E3/uL   Lymphocytes Absolute 2.5 0.7 - 3.1 x10E3/uL   Monocytes Absolute 0.7 0.1 - 0.9 x10E3/uL   EOS (ABSOLUTE) 0.2 0.0 - 0.4 x10E3/uL   Basophils Absolute 0.0 0.0 - 0.2 x10E3/uL   Immature Granulocytes 0 Not Estab. %   Immature Grans (Abs) 0.0 0.0 - 0.1 x10E3/uL  Comprehensive metabolic panel  Result Value Ref Range   Glucose 131 (H) 65 - 99 mg/dL   BUN 6 6 - 20 mg/dL   Creatinine, Ser 0.94 0.57 - 1.00 mg/dL   GFR calc non Af Amer 77 >59 mL/min/1.73   GFR calc Af Amer 88 >59 mL/min/1.73   BUN/Creatinine Ratio 6 (L) 9 - 23   Sodium 139 134 - 144 mmol/L   Potassium 4.2 3.5 - 5.2 mmol/L   Chloride 102 96 - 106 mmol/L   CO2 23 20 - 29 mmol/L   Calcium 10.0 8.7 - 10.2 mg/dL   Total Protein 7.0 6.0 - 8.5 g/dL   Albumin 4.5 3.8 - 4.8 g/dL   Globulin, Total 2.5 1.5 - 4.5 g/dL   Albumin/Globulin Ratio 1.8 1.2 - 2.2   Bilirubin Total 0.2 0.0 - 1.2 mg/dL   Alkaline Phosphatase 77 44 - 121 IU/L   AST 35 0 - 40 IU/L   ALT 34 (H) 0 - 32 IU/L  Pregnancy, urine (STAT)  Result Value Ref Range   Preg Test, Ur Negative Negative  Prolactin  Result Value Ref Range   Prolactin 17.5 4.8 - 09.3 ng/mL  Follicle stimulating hormone  Result Value Ref Range   FSH 8.2 mIU/mL      Assessment & Plan:   Problem List Items Addressed This Visit      Other   ADHD (attention deficit hyperactivity disorder) - Primary    Doing well on current regimen without significant side effects, refills sent. Controlled substance agreement signed during visit and UDS obtained.  Controlled substance agreement signed    Controlled substance agreement signed and UDS obtained in office today.  Per patient Pain management aware that she is receiving Adderall from another provider.   She is drug tested from pain management monthly.    Noted after visit that patient has violated controlled substance agreement here in the past.  Will give patient another chance.  Will discuss with patient at next visit that if she violates this agreement she will no longer be able to receive controlled substances from this office.       Relevant Orders   [id number] 11+Oxyco+Alc+Crt-Bund       Follow up plan: Return in about 3 months (around 06/16/2021) for ADHD FU (virtual).

## 2021-03-17 ENCOUNTER — Other Ambulatory Visit: Payer: Self-pay

## 2021-03-17 ENCOUNTER — Ambulatory Visit: Payer: Medicaid Other | Admitting: Nurse Practitioner

## 2021-03-17 ENCOUNTER — Encounter: Payer: Self-pay | Admitting: Nurse Practitioner

## 2021-03-17 VITALS — BP 105/84 | HR 78 | Temp 99.2°F | Wt 211.0 lb

## 2021-03-17 DIAGNOSIS — F909 Attention-deficit hyperactivity disorder, unspecified type: Secondary | ICD-10-CM

## 2021-03-17 DIAGNOSIS — B379 Candidiasis, unspecified: Secondary | ICD-10-CM

## 2021-03-17 DIAGNOSIS — Z79899 Other long term (current) drug therapy: Secondary | ICD-10-CM

## 2021-03-17 MED ORDER — FLUCONAZOLE 150 MG PO TABS: 150.0000 mg | ORAL_TABLET | Freq: Once | ORAL | 0 refills | Status: AC

## 2021-03-17 MED ORDER — AMPHETAMINE-DEXTROAMPHETAMINE 20 MG PO TABS: 20.0000 mg | ORAL_TABLET | Freq: Two times a day (BID) | ORAL | 0 refills | Status: DC

## 2021-03-17 NOTE — Assessment & Plan Note (Signed)
Controlled substance agreement signed and UDS obtained in office today.  Per patient Pain management aware that she is receiving Adderall from another provider.  She is drug tested from pain management monthly.    Noted after visit that patient has violated controlled substance agreement here in the past.  Will give patient another chance.  Will discuss with patient at next visit that if she violates this agreement she will no longer be able to receive controlled substances from this office.

## 2021-03-17 NOTE — Assessment & Plan Note (Signed)
Doing well on current regimen without significant side effects, refills sent. Controlled substance agreement signed during visit and UDS obtained.

## 2021-03-25 ENCOUNTER — Other Ambulatory Visit: Payer: Self-pay | Admitting: Nurse Practitioner

## 2021-03-25 NOTE — Telephone Encounter (Signed)
Requested medications are due for refill today.  yes  Requested medications are on the active medications list.  yes  Last refill. 01/20/2021  Future visit scheduled.   yes  Notes to clinic.  Medication not delegated.

## 2021-03-25 NOTE — Telephone Encounter (Signed)
I have sent the medication to the pharmacy for a 1 month supply.  Patient will need to see GI for further refills.  This is not a medication that I manage in primary care.

## 2021-03-25 NOTE — Telephone Encounter (Signed)
Scheduled 7/26

## 2021-04-01 LAB — DRUG SCREEN 764883 11+OXYCO+ALC+CRT-BUND
BENZODIAZ UR QL: NEGATIVE ng/mL
Barbiturate: NEGATIVE ng/mL
Cocaine (Metabolite): NEGATIVE ng/mL
Creatinine: 71.7 mg/dL (ref 20.0–300.0)
Ethanol: NEGATIVE %
Meperidine: NEGATIVE ng/mL
Methadone Screen, Urine: NEGATIVE ng/mL
OPIATE SCREEN URINE: NEGATIVE ng/mL
Oxycodone/Oxymorphone, Urine: NEGATIVE ng/mL
Phencyclidine: NEGATIVE ng/mL
Propoxyphene: NEGATIVE ng/mL
pH, Urine: 6.5 (ref 4.5–8.9)

## 2021-04-01 LAB — DRUG PROFILE 799016
Amphetamine GC/MS Conf: 4504 ng/mL
Amphetamine: POSITIVE — AB
Amphetamines: POSITIVE — AB
Methamphetamine: NEGATIVE

## 2021-04-01 LAB — TRAMADOL GC/MS, URINE
Tramadol gc/ms Conf: 13865 ng/mL
Tramadol: POSITIVE — AB

## 2021-04-01 LAB — CANNABINOID CONFIRMATION, UR
CANNABINOIDS: POSITIVE — AB
Carboxy THC GC/MS Conf: 237 ng/mL

## 2021-04-02 NOTE — Progress Notes (Signed)
Hi Tracy Whitaker.  Your urine drug screen came back positive for amphetamines and tramadol which are to be expected due to the medications you are prescribed. However, it was also positive for marijuana.  Due to the interaction between the two medications you can't continue to use both.  We will repeat your drug screen at the next visit, if you are still positive you won't be able to continue to receive your Adderall from our office.  Please let me know if you have any questions.

## 2021-04-03 ENCOUNTER — Other Ambulatory Visit: Payer: Self-pay | Admitting: Nurse Practitioner

## 2021-04-03 NOTE — Telephone Encounter (Signed)
Requested medications are due for refill today yes  Requested medications are on the active medication list yes  Last refill 4/8  Last visit 10/2020 that addressed this med/dx  Future visit scheduled 05/2021  Notes to clinic Not Delegated

## 2021-04-06 NOTE — Telephone Encounter (Signed)
Scheduled 7/26

## 2021-04-07 ENCOUNTER — Other Ambulatory Visit: Payer: Self-pay

## 2021-04-07 ENCOUNTER — Encounter: Payer: Self-pay | Admitting: Internal Medicine

## 2021-04-07 ENCOUNTER — Ambulatory Visit: Payer: Medicaid Other | Admitting: Internal Medicine

## 2021-04-07 VITALS — BP 94/68 | HR 62 | Temp 97.9°F | Ht 64.09 in | Wt 212.8 lb

## 2021-04-07 DIAGNOSIS — F419 Anxiety disorder, unspecified: Secondary | ICD-10-CM | POA: Diagnosis not present

## 2021-04-07 DIAGNOSIS — R350 Frequency of micturition: Secondary | ICD-10-CM | POA: Diagnosis not present

## 2021-04-07 DIAGNOSIS — R82998 Other abnormal findings in urine: Secondary | ICD-10-CM

## 2021-04-07 DIAGNOSIS — R309 Painful micturition, unspecified: Secondary | ICD-10-CM

## 2021-04-07 MED ORDER — NITROFURANTOIN MONOHYD MACRO 100 MG PO CAPS: 100.0000 mg | ORAL_CAPSULE | Freq: Two times a day (BID) | ORAL | 0 refills | Status: AC

## 2021-04-07 NOTE — Progress Notes (Signed)
BP 94/68   Pulse 62   Temp 97.9 F (36.6 C) (Oral)   Ht 5' 4.09" (1.628 m)   Wt 212 lb 12.8 oz (96.5 kg)   SpO2 99%   BMI 36.42 kg/m    Subjective:    Patient ID: [name], female    DOB: Sep 07, 1981, 40 y.o.   MRN: [id number]  HPI: ALLEXUS OVENS is a 40 y.o. female  Urinary Tract Infection  This is a new (X 2 DAYS c.o burning, no flank pain pt is on azo. ) problem. The current episode started yesterday. The quality of the pain is described as burning. The pain is at a severity of 2/10. There has been no fever. Associated symptoms include frequency and urgency. Pertinent negatives include no chills, discharge, flank pain, hematuria, hesitancy, nausea, possible pregnancy, sweats or vomiting.    Chief Complaint  Patient presents with  . Urinary Tract Infection    Relevant past medical, surgical, family and social history reviewed and updated as indicated. Interim medical history since our last visit reviewed. Allergies and medications reviewed and updated.  Review of Systems  Constitutional: Negative for chills.  Gastrointestinal: Negative for nausea and vomiting.  Genitourinary: Positive for frequency and urgency. Negative for flank pain, hematuria and hesitancy.    Per HPI unless specifically indicated above     Objective:    BP 94/68   Pulse 62   Temp 97.9 F (36.6 C) (Oral)   Ht 5' 4.09" (1.628 m)   Wt 212 lb 12.8 oz (96.5 kg)   SpO2 99%   BMI 36.42 kg/m   Wt Readings from Last 3 Encounters:  04/07/21 212 lb 12.8 oz (96.5 kg)  03/17/21 211 lb (95.7 kg)  12/17/20 224 lb 5 oz (101.7 kg)    Physical Exam Constitutional:      General: She is not in acute distress.    Appearance: Normal appearance. She is not ill-appearing or diaphoretic.  Abdominal:     General: Abdomen is flat.     Tenderness: There is no abdominal tenderness. There is no guarding.  Neurological:     Mental Status: She is alert.  Psychiatric:        Mood and Affect: Mood  normal.        Behavior: Behavior normal.     Results for orders placed or performed in visit on 03/17/21  [id number] 11+Oxyco+Alc+Crt-Bund  Result Value Ref Range   Ethanol Negative Cutoff=0.020 %   Amphetamines, Urine See Final Results Cutoff=1000 ng/mL   Barbiturate Negative Cutoff=200 ng/mL   BENZODIAZ UR QL Negative Cutoff=200 ng/mL   Cannabinoid Quant, Ur See Final Results Cutoff=50 ng/mL   Cocaine (Metabolite) Negative Cutoff=300 ng/mL   OPIATE SCREEN URINE Negative Cutoff=300 ng/mL   Oxycodone/Oxymorphone, Urine Negative Cutoff=300 ng/mL   Phencyclidine Negative Cutoff=25 ng/mL   Methadone Screen, Urine Negative Cutoff=300 ng/mL   Propoxyphene Negative Cutoff=300 ng/mL   Meperidine Negative Cutoff=200 ng/mL   Tramadol See Final Results Cutoff=200 ng/mL   Creatinine 71.7 20.0 - 300.0 mg/dL   pH, Urine 6.5 4.5 - 8.9  Drug Profile [phone]  Result Value Ref Range   Amphetamines Positive (A) Cutoff=1000   Amphetamine Positive (A)    Amphetamine GC/MS Conf 4,504 Cutoff=500 ng/mL   Methamphetamine Negative Cutoff=500  Cannabinoid Conf, Ur  Result Value Ref Range   CANNABINOIDS Positive (A) Cutoff=50   Carboxy THC GC/MS Conf 237 Cutoff=15 ng/mL  Tramadol Gc/Ms, Urine  Result Value Ref Range   Tramadol Positive (  A) Cutoff=200   Tramadol gc/ms Conf 13,865 Cutoff=100 ng/mL        Current Outpatient Medications:  .  acyclovir (ZOVIRAX) 800 MG tablet, TAKE 1 TABLET(800 MG) BY MOUTH TWICE DAILY, Disp: 180 tablet, Rfl: 0 .  acyclovir ointment (ZOVIRAX) 5 %, Apply 1 application topically every 3 (three) hours., Disp: 30 g, Rfl: 3 .  [START ON 06/18/2021] amphetamine-dextroamphetamine (ADDERALL) 20 MG tablet, Take 1 tablet (20 mg total) by mouth 2 (two) times daily., Disp: 60 tablet, Rfl: 0 .  [START ON 04/18/2021] amphetamine-dextroamphetamine (ADDERALL) 20 MG tablet, Take 1 tablet (20 mg total) by mouth 2 (two) times daily., Disp: 60 tablet, Rfl: 0 .  amphetamine-dextroamphetamine  (ADDERALL) 20 MG tablet, Take 1 tablet (20 mg total) by mouth 2 (two) times daily., Disp: 60 tablet, Rfl: 0 .  ARIPiprazole (ABILIFY) 10 MG tablet, Take 10 mg twice a day, Disp: , Rfl:  .  CRYSELLE-28 0.3-30 MG-MCG tablet, TAKE 1 TABLET BY MOUTH EVERY DAY, Disp: 84 tablet, Rfl: 1 .  DELZICOL 400 MG CPDR DR capsule, TAKE 2 CAPSULES(800 MG) BY MOUTH TWICE DAILY, Disp: 180 capsule, Rfl: 0 .  divalproex (DEPAKOTE) 250 MG DR tablet, Take 250 mg by mouth 2 (two) times daily., Disp: , Rfl:  .  gabapentin (NEURONTIN) 600 MG tablet, Take 600 mg by mouth 3 (three) times daily. 1200 MG at bedtime, Disp: , Rfl:  .  metFORMIN (GLUCOPHAGE) 500 MG tablet, TAKE 1 TABLET(500 MG) BY MOUTH TWICE DAILY WITH A MEAL, Disp: 180 tablet, Rfl: 1 .  methocarbamol (ROBAXIN) 500 MG tablet, TAKE 1 TABLET(500 MG) BY MOUTH EVERY 8 HOURS AS NEEDED FOR MUSCLE SPASMS, Disp: 90 tablet, Rfl: 1 .  nortriptyline (PAMELOR) 10 MG capsule, Start Nortriptyline (Pamelor) 10 mg nightly for one week, then increase to 20 mg nightly, Disp: , Rfl:  .  omeprazole (PRILOSEC) 20 MG capsule, TAKE 1 CAPSULE(20 MG) BY MOUTH DAILY, Disp: 90 capsule, Rfl: 0 .  ondansetron (ZOFRAN-ODT) 4 MG disintegrating tablet, DISSOLVE 1 TABLET(4 MG) ON THE TONGUE EVERY 8 HOURS AS NEEDED, Disp: 40 tablet, Rfl: 0 .  spironolactone (ALDACTONE) 100 MG tablet, TAKE 1 TABLET(100 MG) BY MOUTH DAILY, Disp: 90 tablet, Rfl: 1 .  topiramate (TOPAMAX) 25 MG tablet, Take 25 mg by mouth 2 (two) times daily., Disp: , Rfl:  .  traMADol (ULTRAM) 50 MG tablet, Take 50 mg by mouth 2 (two) times daily., Disp: , Rfl:  .  venlafaxine (EFFEXOR) 75 MG tablet, Take 75 mg by mouth 2 (two) times daily. At night, Disp: , Rfl:  .  clonazePAM (KLONOPIN) 0.5 MG tablet, Take by mouth., Disp: , Rfl:     Assessment & Plan:  UTI: will start pt on nitrofurantoin.  Pt has freq and urgency Will need to send ur for culture      Problem List Items Addressed This Visit   None   Visit Diagnoses     Painful urination    -  Primary   Relevant Orders   Urinalysis, Routine w reflex microscopic   Frequency of urination       Relevant Orders   Urinalysis, Routine w reflex microscopic       Follow up plan: No follow-ups on file.

## 2021-04-08 LAB — URINALYSIS, ROUTINE W REFLEX MICROSCOPIC

## 2021-04-08 LAB — MICROSCOPIC EXAMINATION

## 2021-04-13 DIAGNOSIS — Z79899 Other long term (current) drug therapy: Secondary | ICD-10-CM | POA: Diagnosis not present

## 2021-04-13 DIAGNOSIS — R635 Abnormal weight gain: Secondary | ICD-10-CM | POA: Diagnosis not present

## 2021-04-13 DIAGNOSIS — M549 Dorsalgia, unspecified: Secondary | ICD-10-CM | POA: Diagnosis not present

## 2021-04-13 DIAGNOSIS — G8929 Other chronic pain: Secondary | ICD-10-CM | POA: Diagnosis not present

## 2021-04-13 DIAGNOSIS — R0602 Shortness of breath: Secondary | ICD-10-CM | POA: Diagnosis not present

## 2021-04-13 LAB — URINE CULTURE

## 2021-05-03 ENCOUNTER — Other Ambulatory Visit: Payer: Self-pay | Admitting: Nurse Practitioner

## 2021-05-03 NOTE — Telephone Encounter (Signed)
Requested Prescriptions  Pending Prescriptions Disp Refills  . CRYSELLE-28 0.3-30 MG-MCG tablet [Pharmacy Med Name: CRYSELLE TABLETS 28] 84 tablet 1    Sig: TAKE 1 TABLET BY MOUTH EVERY DAY     OB/GYN:  Contraceptives Passed - 05/03/2021  9:22 AM      Passed - Last BP in normal range    BP Readings from Last 1 Encounters:  04/07/21 94/68         Passed - Valid encounter within last 12 months    Recent Outpatient Visits          3 weeks ago Painful urination   [place] Vigg, Avanti, MD   1 month ago Attention deficit hyperactivity disorder (ADHD), unspecified ADHD type   [facility] Jon Billings, NP   4 months ago Attention deficit hyperactivity disorder (ADHD), unspecified ADHD type   [facility] Rory Percy M, DO   6 months ago Weight gain   [facility] Myles Gip, DO   6 months ago Erroneous encounter - disregard   [facility] Eulogio Bear, NP      Future Appointments            In 1 month Jon Billings, NP [facility], [place]

## 2021-05-08 ENCOUNTER — Other Ambulatory Visit: Payer: Self-pay | Admitting: Nurse Practitioner

## 2021-05-08 NOTE — Telephone Encounter (Signed)
Requested medication (s) are due for refill today:  no  Requested medication (s) are on the active medication list: yes   Last refill: 03/25/2021  Future visit scheduled: yes   Notes to clinic: this refill cannot be delegated    Requested Prescriptions  Pending Prescriptions Disp Refills   DELZICOL 400 MG CPDR DR capsule [Pharmacy Med Name: DELZICOL 400MG CAPSULES] 180 capsule 0    Sig: TAKE 2 CAPSULES(800 MG) BY MOUTH TWICE DAILY      Not Delegated - Gastroenterology:  Inflammatory Bowel Disease (Oral) Failed - 05/08/2021  3:38 AM      Failed - This refill cannot be delegated      Failed - AST in normal range and within 180 days    AST  Date Value Ref Range Status  10/28/2020 35 0 - 40 IU/L Final          Failed - ALT in normal range and within 180 days    ALT  Date Value Ref Range Status  10/28/2020 34 (H) 0 - 32 IU/L Final          Failed - WBC in normal range and within 180 days    WBC  Date Value Ref Range Status  10/28/2020 8.4 3.4 - 10.8 x10E3/uL Final  08/23/2017 10.8 3.6 - 11.0 K/uL Final          Failed - HGB in normal range and within 180 days    Hemoglobin  Date Value Ref Range Status  10/28/2020 16.4 (H) 11.1 - 15.9 g/dL Final          Failed - HCT in normal range and within 180 days    Hematocrit  Date Value Ref Range Status  10/28/2020 47.6 (H) 34.0 - 46.6 % Final          Failed - PLT in normal range and within 180 days    Platelets  Date Value Ref Range Status  12/18/2019 359 150 - 450 x10E3/uL Final          Passed - Cr in normal range and within 180 days    Creatinine  Date Value Ref Range Status  03/17/2021 71.7 20.0 - 300.0 mg/dL Final   Creatinine, Ser  Date Value Ref Range Status  10/28/2020 0.94 0.57 - 1.00 mg/dL Final          Passed - Valid encounter within last 6 months    Recent Outpatient Visits           1 month ago Painful urination   [place] Vigg, Avanti, MD   1 month ago Attention deficit  hyperactivity disorder (ADHD), unspecified ADHD type   [facility] Jon Billings, NP   4 months ago Attention deficit hyperactivity disorder (ADHD), unspecified ADHD type   [facility] Myles Gip, DO   6 months ago Weight gain   [facility] Myles Gip, DO   6 months ago Erroneous encounter - disregard   [place] Eulogio Bear, NP       Future Appointments             In 1 month Jon Billings, NP [facility], [place]

## 2021-05-08 NOTE — Telephone Encounter (Signed)
Scheduled 7/26

## 2021-05-09 NOTE — Telephone Encounter (Signed)
Requested Prescriptions  Pending Prescriptions Disp Refills  . acyclovir (ZOVIRAX) 800 MG tablet [Pharmacy Med Name: ACYCLOVIR 800MG TABLETS] 180 tablet 0    Sig: TAKE 1 TABLET(800 MG) BY MOUTH TWICE DAILY     Antimicrobials:  Antiviral Agents - Anti-Herpetic Passed - 05/08/2021  6:37 PM      Passed - Valid encounter within last 12 months    Recent Outpatient Visits          1 month ago Painful urination   [place], MD   1 month ago Attention deficit hyperactivity disorder (ADHD), unspecified ADHD type   [facility] Jon Billings, NP   4 months ago Attention deficit hyperactivity disorder (ADHD), unspecified ADHD type   [facility] Rory Percy M, DO   6 months ago Weight gain   [facility] Myles Gip, DO   6 months ago Erroneous encounter - disregard   [facility] Eulogio Bear, NP      Future Appointments            In 1 month Jon Billings, NP [facility], [place]

## 2021-05-17 ENCOUNTER — Other Ambulatory Visit: Payer: Self-pay | Admitting: Nurse Practitioner

## 2021-05-17 NOTE — Telephone Encounter (Signed)
Requested Prescriptions  Pending Prescriptions Disp Refills  . omeprazole (PRILOSEC) 20 MG capsule [Pharmacy Med Name: OMEPRAZOLE 20MG CAPSULES] 90 capsule 0    Sig: TAKE 1 CAPSULE(20 MG) BY MOUTH DAILY     Gastroenterology: Proton Pump Inhibitors Passed - 05/17/2021  7:55 AM      Passed - Valid encounter within last 12 months    Recent Outpatient Visits          1 month ago Painful urination   [place], MD   2 months ago Attention deficit hyperactivity disorder (ADHD), unspecified ADHD type   [facility] Jon Billings, NP   5 months ago Attention deficit hyperactivity disorder (ADHD), unspecified ADHD type   [facility] Rory Percy M, DO   6 months ago Weight gain   [facility] Myles Gip, DO   7 months ago Erroneous encounter - disregard   [facility] Eulogio Bear, NP      Future Appointments            In 1 month Jon Billings, NP [facility], [place]

## 2021-05-18 DIAGNOSIS — Z1159 Encounter for screening for other viral diseases: Secondary | ICD-10-CM | POA: Diagnosis not present

## 2021-05-18 DIAGNOSIS — Z79899 Other long term (current) drug therapy: Secondary | ICD-10-CM | POA: Diagnosis not present

## 2021-05-18 DIAGNOSIS — F909 Attention-deficit hyperactivity disorder, unspecified type: Secondary | ICD-10-CM | POA: Diagnosis not present

## 2021-05-18 DIAGNOSIS — G8929 Other chronic pain: Secondary | ICD-10-CM | POA: Diagnosis not present

## 2021-05-18 DIAGNOSIS — M549 Dorsalgia, unspecified: Secondary | ICD-10-CM | POA: Diagnosis not present

## 2021-05-20 MED ORDER — AMPHETAMINE-DEXTROAMPHETAMINE 20 MG PO TABS: 20.0000 mg | ORAL_TABLET | Freq: Two times a day (BID) | ORAL | 0 refills | Status: DC

## 2021-05-21 DIAGNOSIS — Z79899 Other long term (current) drug therapy: Secondary | ICD-10-CM | POA: Diagnosis not present

## 2021-06-11 ENCOUNTER — Other Ambulatory Visit: Payer: Self-pay | Admitting: Nurse Practitioner

## 2021-06-11 NOTE — Telephone Encounter (Signed)
Requested medications are due for refill today.  yes  Requested medications are on the active medications list.  yes  Last refill. 04/06/2021  Future visit scheduled.   yes  Notes to clinic.  Medication not delegated.

## 2021-06-11 NOTE — Telephone Encounter (Signed)
Pt is scheduled 7/26

## 2021-06-15 DIAGNOSIS — M129 Arthropathy, unspecified: Secondary | ICD-10-CM | POA: Diagnosis not present

## 2021-06-15 DIAGNOSIS — R635 Abnormal weight gain: Secondary | ICD-10-CM | POA: Diagnosis not present

## 2021-06-15 DIAGNOSIS — M549 Dorsalgia, unspecified: Secondary | ICD-10-CM | POA: Diagnosis not present

## 2021-06-15 DIAGNOSIS — Z1159 Encounter for screening for other viral diseases: Secondary | ICD-10-CM | POA: Diagnosis not present

## 2021-06-15 DIAGNOSIS — Z79899 Other long term (current) drug therapy: Secondary | ICD-10-CM | POA: Diagnosis not present

## 2021-06-15 DIAGNOSIS — R5383 Other fatigue: Secondary | ICD-10-CM | POA: Diagnosis not present

## 2021-06-15 DIAGNOSIS — E7800 Pure hypercholesterolemia, unspecified: Secondary | ICD-10-CM | POA: Diagnosis not present

## 2021-06-15 DIAGNOSIS — E559 Vitamin D deficiency, unspecified: Secondary | ICD-10-CM | POA: Diagnosis not present

## 2021-06-15 DIAGNOSIS — G8929 Other chronic pain: Secondary | ICD-10-CM | POA: Diagnosis not present

## 2021-06-15 DIAGNOSIS — F909 Attention-deficit hyperactivity disorder, unspecified type: Secondary | ICD-10-CM | POA: Diagnosis not present

## 2021-06-15 NOTE — Progress Notes (Signed)
Wt 201 lb (91.2 kg)   BMI 34.40 kg/m    Subjective:    Patient ID: Tracy Whitaker, female    DOB: [birth year], 40 y.o.   MRN: [id number]  HPI: Tracy Whitaker is a 40 y.o. female  Chief Complaint  Patient presents with   ADD   ADHD FOLLOW UP ADHD status: controlled Satisfied with current therapy: yes Medication compliance:  excellent compliance Controlled substance contract: yes Previous psychiatry evaluation:  yes Previous medications: no adderall   Taking meds on weekends/vacations: yes Work/school performance:  excellent Difficulty sustaining attention/completing tasks: no Distracted by extraneous stimuli: no Does not listen when spoken to: yes  Fidgets with hands or feet: yes Unable to stay in seat: no Blurts out/interrupts others:  occassional ADHD Medication Side Effects: no    Decreased appetite: no    Headache: no    Sleeping disturbance pattern: no    Irritability: no    Rebound effects (worse than baseline) off medication: no    Anxiousness: no    Dizziness: no    Tics: no  Relevant past medical, surgical, family and social history reviewed and updated as indicated. Interim medical history since our last visit reviewed. Allergies and medications reviewed and updated.  Review of Systems  Constitutional:  Negative for appetite change.  Neurological:  Negative for dizziness.  Psychiatric/Behavioral:  Positive for decreased concentration. Negative for agitation and sleep disturbance. The patient is not nervous/anxious.    Per HPI unless specifically indicated above     Objective:    Wt 201 lb (91.2 kg)   BMI 34.40 kg/m   Wt Readings from Last 3 Encounters:  06/16/21 201 lb (91.2 kg)  04/07/21 212 lb 12.8 oz (96.5 kg)  03/17/21 211 lb (95.7 kg)    Physical Exam Vitals and nursing note reviewed.  HENT:     Head: Normocephalic.     Right Ear: Hearing normal.     Left Ear: Hearing normal.     Nose: Nose normal.  Eyes:     Pupils: Pupils are  equal, round, and reactive to light.  Pulmonary:     Effort: Pulmonary effort is normal. No respiratory distress.  Neurological:     Mental Status: She is alert.  Psychiatric:        Mood and Affect: Mood normal.        Behavior: Behavior normal.        Thought Content: Thought content normal.        Judgment: Judgment normal.    Results for orders placed or performed in visit on 04/07/21  Urine Culture   Specimen: Urine   UR  Result Value Ref Range   Urine Culture, Routine Final report (A)    Organism ID, Bacteria Escherichia coli (A)    Antimicrobial Susceptibility Comment   Microscopic Examination   Urine  Result Value Ref Range   WBC, UA 0-5 0 - 5 /hpf   RBC 0-2 0 - 2 /hpf   Epithelial Cells (non renal) 0-10 0 - 10 /hpf   Renal Epithel, UA 0-10 (A) None seen /hpf   Bacteria, UA Few None seen/Few  Urinalysis, Routine w reflex microscopic  Result Value Ref Range   Specific Gravity, UA CANCELED    pH, UA CANCELED    Color, UA Orange Yellow   Appearance Ur Clear Clear   Protein,UA CANCELED    Glucose, UA CANCELED    Ketones, UA CANCELED    Microscopic Examination See below:  Assessment & Plan:   Problem List Items Addressed This Visit       Other   ADHD (attention deficit hyperactivity disorder) - Primary    Chronic.  Controlled.  Continue with current medication regimen.  Labs ordered today.  Return to clinic in 3 months in office for reevaluation.  Call sooner if concerns arise. PDMP checked. Refills sent. Up to date on controlled substance agreement and UDS.          Follow up plan: Return in about 3 months (around 09/16/2021) for ADHD FU in office.   This visit was completed via MyChart due to the restrictions of the COVID-19 pandemic. All issues as above were discussed and addressed. Physical exam was done as above through visual confirmation on MyChart. If it was felt that the patient should be evaluated in the office, they were directed there. The  patient verbally consented to this visit. Location of the patient: Home Location of the provider: Office Those involved with this call:  Provider: Jon Billings, NP CMA: Tiffany Reel, CMA Front Desk/Registration: Jill Side This encounter was conducted via video.  I spent 15 dedicated to the care of this patient on the date of this encounter to include previsit review of 20, face to face time with the patient, and post visit ordering of testing.

## 2021-06-16 ENCOUNTER — Other Ambulatory Visit: Payer: Self-pay

## 2021-06-16 ENCOUNTER — Ambulatory Visit (INDEPENDENT_AMBULATORY_CARE_PROVIDER_SITE_OTHER): Payer: Medicaid Other | Admitting: Nurse Practitioner

## 2021-06-16 ENCOUNTER — Encounter: Payer: Self-pay | Admitting: Nurse Practitioner

## 2021-06-16 VITALS — Wt 201.0 lb

## 2021-06-16 DIAGNOSIS — F909 Attention-deficit hyperactivity disorder, unspecified type: Secondary | ICD-10-CM | POA: Diagnosis not present

## 2021-06-16 MED ORDER — AMPHETAMINE-DEXTROAMPHETAMINE 20 MG PO TABS: 20.0000 mg | ORAL_TABLET | Freq: Two times a day (BID) | ORAL | 0 refills | Status: DC

## 2021-06-16 NOTE — Assessment & Plan Note (Signed)
Chronic.  Controlled.  Continue with current medication regimen.  Labs ordered today.  Return to clinic in 3 months in office for reevaluation.  Call sooner if concerns arise. PDMP checked. Refills sent. Up to date on controlled substance agreement and UDS.

## 2021-06-17 DIAGNOSIS — Z79899 Other long term (current) drug therapy: Secondary | ICD-10-CM | POA: Diagnosis not present

## 2021-07-13 DIAGNOSIS — R29898 Other symptoms and signs involving the musculoskeletal system: Secondary | ICD-10-CM | POA: Diagnosis not present

## 2021-07-13 DIAGNOSIS — R208 Other disturbances of skin sensation: Secondary | ICD-10-CM | POA: Diagnosis not present

## 2021-07-13 DIAGNOSIS — R200 Anesthesia of skin: Secondary | ICD-10-CM | POA: Diagnosis not present

## 2021-07-13 DIAGNOSIS — M797 Fibromyalgia: Secondary | ICD-10-CM | POA: Diagnosis not present

## 2021-07-14 DIAGNOSIS — G8929 Other chronic pain: Secondary | ICD-10-CM | POA: Diagnosis not present

## 2021-07-14 DIAGNOSIS — R635 Abnormal weight gain: Secondary | ICD-10-CM | POA: Diagnosis not present

## 2021-07-14 DIAGNOSIS — F909 Attention-deficit hyperactivity disorder, unspecified type: Secondary | ICD-10-CM | POA: Diagnosis not present

## 2021-07-14 DIAGNOSIS — M549 Dorsalgia, unspecified: Secondary | ICD-10-CM | POA: Diagnosis not present

## 2021-07-14 DIAGNOSIS — Z79899 Other long term (current) drug therapy: Secondary | ICD-10-CM | POA: Diagnosis not present

## 2021-07-14 DIAGNOSIS — Z1159 Encounter for screening for other viral diseases: Secondary | ICD-10-CM | POA: Diagnosis not present

## 2021-07-14 DIAGNOSIS — E7800 Pure hypercholesterolemia, unspecified: Secondary | ICD-10-CM | POA: Diagnosis not present

## 2021-07-15 DIAGNOSIS — Z79899 Other long term (current) drug therapy: Secondary | ICD-10-CM | POA: Diagnosis not present

## 2021-07-20 MED ORDER — AMPHETAMINE-DEXTROAMPHETAMINE 20 MG PO TABS: 20.0000 mg | ORAL_TABLET | Freq: Two times a day (BID) | ORAL | 0 refills | Status: DC

## 2021-08-13 ENCOUNTER — Other Ambulatory Visit: Payer: Self-pay | Admitting: Nurse Practitioner

## 2021-08-13 DIAGNOSIS — G8929 Other chronic pain: Secondary | ICD-10-CM | POA: Diagnosis not present

## 2021-08-13 DIAGNOSIS — Z23 Encounter for immunization: Secondary | ICD-10-CM | POA: Diagnosis not present

## 2021-08-13 DIAGNOSIS — E7800 Pure hypercholesterolemia, unspecified: Secondary | ICD-10-CM | POA: Diagnosis not present

## 2021-08-13 DIAGNOSIS — Z79899 Other long term (current) drug therapy: Secondary | ICD-10-CM | POA: Diagnosis not present

## 2021-08-13 DIAGNOSIS — F909 Attention-deficit hyperactivity disorder, unspecified type: Secondary | ICD-10-CM | POA: Diagnosis not present

## 2021-08-13 DIAGNOSIS — M549 Dorsalgia, unspecified: Secondary | ICD-10-CM | POA: Diagnosis not present

## 2021-08-13 DIAGNOSIS — Z6834 Body mass index (BMI) 34.0-34.9, adult: Secondary | ICD-10-CM | POA: Diagnosis not present

## 2021-08-13 DIAGNOSIS — R635 Abnormal weight gain: Secondary | ICD-10-CM | POA: Diagnosis not present

## 2021-08-15 ENCOUNTER — Other Ambulatory Visit: Payer: Self-pay | Admitting: Nurse Practitioner

## 2021-08-17 DIAGNOSIS — Z79899 Other long term (current) drug therapy: Secondary | ICD-10-CM | POA: Diagnosis not present

## 2021-09-11 ENCOUNTER — Other Ambulatory Visit: Payer: Self-pay | Admitting: Family Medicine

## 2021-09-11 ENCOUNTER — Other Ambulatory Visit: Payer: Self-pay | Admitting: Nurse Practitioner

## 2021-09-11 NOTE — Telephone Encounter (Signed)
Requested medications are due for refill today yes  Requested medications are on the active medication list yes  Last refill 07/27/21  Last visit Do not see this med/dx addressed  Future visit scheduled 10/25  Notes to clinic Do not see this med/dx addressed in past 12 months, please assess.  Requested Prescriptions  Pending Prescriptions Disp Refills   spironolactone (ALDACTONE) 100 MG tablet [Pharmacy Med Name: SPIRONOLACTONE 100MG TABLETS] 90 tablet 1    Sig: TAKE 1 TABLET(100 MG) BY MOUTH DAILY     Cardiovascular: Diuretics - Aldosterone Antagonist Passed - 09/11/2021  9:38 AM      Passed - Cr in normal range and within 360 days    Creatinine  Date Value Ref Range Status  03/17/2021 71.7 20.0 - 300.0 mg/dL Final   Creatinine, Ser  Date Value Ref Range Status  10/28/2020 0.94 0.57 - 1.00 mg/dL Final          Passed - K in normal range and within 360 days    Potassium  Date Value Ref Range Status  10/28/2020 4.2 3.5 - 5.2 mmol/L Final          Passed - Na in normal range and within 360 days    Sodium  Date Value Ref Range Status  10/28/2020 139 134 - 144 mmol/L Final          Passed - Last BP in normal range    BP Readings from Last 1 Encounters:  04/07/21 94/68          Passed - Valid encounter within last 6 months    Recent Outpatient Visits           2 months ago Attention deficit hyperactivity disorder (ADHD), unspecified ADHD type   [facility] Jon Billings, NP   5 months ago Painful urination   [place] Vigg, Avanti, MD   5 months ago Attention deficit hyperactivity disorder (ADHD), unspecified ADHD type   [facility] Jon Billings, NP   8 months ago Attention deficit hyperactivity disorder (ADHD), unspecified ADHD type   [facility] Rory Percy M, DO   10 months ago Weight gain   [place], Jake Church, DO       Future Appointments             In 4 days  Jon Billings, NP [facility], [place]

## 2021-09-15 ENCOUNTER — Ambulatory Visit: Payer: Medicaid Other | Admitting: Nurse Practitioner

## 2021-09-15 ENCOUNTER — Other Ambulatory Visit: Payer: Self-pay | Admitting: Nurse Practitioner

## 2021-09-15 NOTE — Telephone Encounter (Signed)
Requested medications are due for refill today.  yes  Requested medications are on the active medications list.  yes  Last refill. 06/11/2021  Future visit scheduled.   yes  Notes to clinic.  Medication not delegated.

## 2021-09-16 NOTE — Telephone Encounter (Signed)
Please see below and advise.

## 2021-09-21 DIAGNOSIS — Z1159 Encounter for screening for other viral diseases: Secondary | ICD-10-CM | POA: Diagnosis not present

## 2021-09-21 DIAGNOSIS — Z6833 Body mass index (BMI) 33.0-33.9, adult: Secondary | ICD-10-CM | POA: Diagnosis not present

## 2021-09-21 DIAGNOSIS — Z79899 Other long term (current) drug therapy: Secondary | ICD-10-CM | POA: Diagnosis not present

## 2021-09-21 DIAGNOSIS — Z6834 Body mass index (BMI) 34.0-34.9, adult: Secondary | ICD-10-CM | POA: Diagnosis not present

## 2021-09-21 DIAGNOSIS — R5383 Other fatigue: Secondary | ICD-10-CM | POA: Diagnosis not present

## 2021-09-21 DIAGNOSIS — Z0000 Encounter for general adult medical examination without abnormal findings: Secondary | ICD-10-CM | POA: Diagnosis not present

## 2021-09-21 DIAGNOSIS — E7800 Pure hypercholesterolemia, unspecified: Secondary | ICD-10-CM | POA: Diagnosis not present

## 2021-09-21 DIAGNOSIS — M129 Arthropathy, unspecified: Secondary | ICD-10-CM | POA: Diagnosis not present

## 2021-09-21 DIAGNOSIS — E559 Vitamin D deficiency, unspecified: Secondary | ICD-10-CM | POA: Diagnosis not present

## 2021-09-21 DIAGNOSIS — R0602 Shortness of breath: Secondary | ICD-10-CM | POA: Diagnosis not present

## 2021-09-23 DIAGNOSIS — Z79899 Other long term (current) drug therapy: Secondary | ICD-10-CM | POA: Diagnosis not present

## 2021-09-24 ENCOUNTER — Ambulatory Visit: Payer: Medicaid Other | Admitting: Nurse Practitioner

## 2021-09-24 ENCOUNTER — Encounter: Payer: Self-pay | Admitting: Nurse Practitioner

## 2021-09-24 ENCOUNTER — Other Ambulatory Visit: Payer: Self-pay

## 2021-09-24 VITALS — BP 87/61 | HR 86 | Wt 193.0 lb

## 2021-09-24 DIAGNOSIS — Z1231 Encounter for screening mammogram for malignant neoplasm of breast: Secondary | ICD-10-CM | POA: Diagnosis not present

## 2021-09-24 DIAGNOSIS — F909 Attention-deficit hyperactivity disorder, unspecified type: Secondary | ICD-10-CM | POA: Diagnosis not present

## 2021-09-24 DIAGNOSIS — M797 Fibromyalgia: Secondary | ICD-10-CM

## 2021-09-24 MED ORDER — AMPHETAMINE-DEXTROAMPHETAMINE 20 MG PO TABS: 20.0000 mg | ORAL_TABLET | Freq: Two times a day (BID) | ORAL | 0 refills | Status: DC

## 2021-09-24 NOTE — Progress Notes (Signed)
BP (!) 87/61   Pulse 86   Wt 193 lb (87.5 kg)   SpO2 99%   BMI 33.03 kg/m    Subjective:    Patient ID: Tracy Whitaker, female    DOB: 1981/05/12, 40 y.o.   MRN: [id number]  HPI: MODEST DRAEGER is a 40 y.o. female  Chief Complaint  Patient presents with   ADHD   Referral    Patient states she needs a referral to see her chiropractor. [place].   ADHD FOLLOW UP ADHD status: controlled- she has been out for 4 days Satisfied with current therapy: yes Medication compliance:  excellent compliance Controlled substance contract: yes Previous psychiatry evaluation:  yes Previous medications: no adderall  she takes it twice daily because the first dose wears off 11-12.  This helps sustain concentration for her entire work day.  Taking meds on weekends/vacations: yes Work/school performance:  excellent Difficulty sustaining attention/completing tasks: no Distracted by extraneous stimuli: no Does not listen when spoken to: yes  Fidgets with hands or feet: yes Unable to stay in seat: no Blurts out/interrupts others:  occassional ADHD Medication Side Effects: no    Decreased appetite: no    Headache: no    Sleeping disturbance pattern: no    Irritability: no    Rebound effects (worse than baseline) off medication: no    Anxiousness: no    Dizziness: no    Tics: no  Relevant past medical, surgical, family and social history reviewed and updated as indicated. Interim medical history since our last visit reviewed. Allergies and medications reviewed and updated.  Review of Systems  Constitutional:  Negative for appetite change.  Neurological:  Negative for dizziness.  Psychiatric/Behavioral:  Positive for decreased concentration. Negative for agitation and sleep disturbance. The patient is not nervous/anxious.    Per HPI unless specifically indicated above     Objective:    BP (!) 87/61   Pulse 86   Wt 193 lb (87.5 kg)   SpO2 99%   BMI 33.03 kg/m   Wt Readings  from Last 3 Encounters:  09/24/21 193 lb (87.5 kg)  06/16/21 201 lb (91.2 kg)  04/07/21 212 lb 12.8 oz (96.5 kg)    Physical Exam Vitals and nursing note reviewed.  Constitutional:      General: She is not in acute distress.    Appearance: Normal appearance. She is normal weight. She is not ill-appearing, toxic-appearing or diaphoretic.  HENT:     Head: Normocephalic.     Right Ear: Hearing and external ear normal.     Left Ear: Hearing and external ear normal.     Nose: Nose normal.     Mouth/Throat:     Mouth: Mucous membranes are moist.     Pharynx: Oropharynx is clear.  Eyes:     General:        Right eye: No discharge.        Left eye: No discharge.     Extraocular Movements: Extraocular movements intact.     Conjunctiva/sclera: Conjunctivae normal.     Pupils: Pupils are equal, round, and reactive to light.  Cardiovascular:     Rate and Rhythm: Normal rate and regular rhythm.     Heart sounds: No murmur heard. Pulmonary:     Effort: Pulmonary effort is normal. No respiratory distress.     Breath sounds: Normal breath sounds. No wheezing or rales.  Musculoskeletal:     Cervical back: Normal range of motion and neck supple.  Skin:    General: Skin is warm and dry.     Capillary Refill: Capillary refill takes less than 2 seconds.  Neurological:     General: No focal deficit present.     Mental Status: She is alert and oriented to person, place, and time. Mental status is at baseline.  Psychiatric:        Mood and Affect: Mood normal.        Behavior: Behavior normal.        Thought Content: Thought content normal.        Judgment: Judgment normal.    Results for orders placed or performed in visit on 04/07/21  Urine Culture   Specimen: Urine   UR  Result Value Ref Range   Urine Culture, Routine Final report (A)    Organism ID, Bacteria Escherichia coli (A)    Antimicrobial Susceptibility Comment   Microscopic Examination   Urine  Result Value Ref Range    WBC, UA 0-5 0 - 5 /hpf   RBC 0-2 0 - 2 /hpf   Epithelial Cells (non renal) 0-10 0 - 10 /hpf   Renal Epithel, UA 0-10 (A) None seen /hpf   Bacteria, UA Few None seen/Few  Urinalysis, Routine w reflex microscopic  Result Value Ref Range   Specific Gravity, UA CANCELED    pH, UA CANCELED    Color, UA Orange Yellow   Appearance Ur Clear Clear   Protein,UA CANCELED    Glucose, UA CANCELED    Ketones, UA CANCELED    Microscopic Examination See below:       Assessment & Plan:   Problem List Items Addressed This Visit       Other   ADHD (attention deficit hyperactivity disorder)    Chronic.  Controlled.  Has been out for 4 days.  Continue with current medication regimen.  Labs ordered today.  Return to clinic in 3 months in office for reevaluation.  Call sooner if concerns arise. PDMP checked. Refills sent. Up to date on controlled substance agreement and UDS.       Fibromyalgia    Patient has been seeing the chiropractor.  She needs an updated referral.      Relevant Orders   Ambulatory referral to Chiropractic   Other Visit Diagnoses     Breast cancer screening by mammogram    -  Primary   Relevant Orders   MM Digital Screening        Follow up plan: Return in about 3 months (around 12/25/2021) for ADHD FU (virtual).

## 2021-09-24 NOTE — Assessment & Plan Note (Signed)
Patient has been seeing the chiropractor.  She needs an updated referral.

## 2021-09-24 NOTE — Assessment & Plan Note (Signed)
Chronic.  Controlled.  Has been out for 4 days.  Continue with current medication regimen.  Labs ordered today.  Return to clinic in 3 months in office for reevaluation.  Call sooner if concerns arise. PDMP checked. Refills sent. Up to date on controlled substance agreement and UDS.

## 2021-09-26 DIAGNOSIS — R3911 Hesitancy of micturition: Secondary | ICD-10-CM | POA: Diagnosis not present

## 2021-10-24 ENCOUNTER — Other Ambulatory Visit: Payer: Self-pay | Admitting: Nurse Practitioner

## 2021-10-25 NOTE — Telephone Encounter (Signed)
Requested medication (s) are due for refill today: yes  Requested medication (s) are on the active medication list: yes  Last refill:  12/12/20 # 180/ 1   Future visit scheduled: yes  Notes to clinic:  overdue lab work   Requested Prescriptions  Pending Prescriptions Disp Refills   metFORMIN (GLUCOPHAGE) 500 MG tablet [Pharmacy Med Name: METFORMIN 500MG TABLETS] 180 tablet 1    Sig: TAKE 1 TABLET(500 MG) BY MOUTH TWICE DAILY WITH A MEAL     Endocrinology:  Diabetes - Biguanides Failed - 10/24/2021  3:36 AM      Failed - HBA1C is between 0 and 7.9 and within 180 days    HB A1C (BAYER DCA - WAIVED)  Date Value Ref Range Status  10/28/2020 5.4 <7.0 % Final    Comment:                                          Diabetic Adult            <7.0                                       Healthy Adult        4.3 - 5.7                                                           (DCCT/NGSP) American Diabetes Association's Summary of Glycemic Recommendations for Adults with Diabetes: Hemoglobin A1c <7.0%. More stringent glycemic goals (A1c <6.0%) may further reduce complications at the cost of increased risk of hypoglycemia.           Failed - eGFR in normal range and within 360 days    GFR calc Af Amer  Date Value Ref Range Status  10/28/2020 88 >59 mL/min/1.73 Final    Comment:    **In accordance with recommendations from the NKF-ASN Task force,**   [facility] is in the process of updating its eGFR calculation to the   2021 CKD-EPI creatinine equation that estimates kidney function   without a race variable.    GFR calc non Af Amer  Date Value Ref Range Status  10/28/2020 77 >59 mL/min/1.73 Final          Passed - Cr in normal range and within 360 days    Creatinine  Date Value Ref Range Status  03/17/2021 71.7 20.0 - 300.0 mg/dL Final   Creatinine, Ser  Date Value Ref Range Status  10/28/2020 0.94 0.57 - 1.00 mg/dL Final          Passed - Valid encounter within last 6 months     Recent Outpatient Visits           1 month ago Breast cancer screening by mammogram   [facility] Jon Billings, NP   4 months ago Attention deficit hyperactivity disorder (ADHD), unspecified ADHD type   [place], NP   6 months ago Painful urination   [place], MD   7 months ago Attention deficit hyperactivity disorder (ADHD), unspecified ADHD type   [facility],  Santiago Glad, NP   10 months ago Attention deficit hyperactivity disorder (ADHD), unspecified ADHD type   [place], DO       Future Appointments             In 2 months Jon Billings, NP [facility], [place]

## 2021-10-26 ENCOUNTER — Other Ambulatory Visit: Payer: Self-pay | Admitting: Nurse Practitioner

## 2021-10-26 DIAGNOSIS — Z6833 Body mass index (BMI) 33.0-33.9, adult: Secondary | ICD-10-CM | POA: Diagnosis not present

## 2021-10-26 DIAGNOSIS — E7800 Pure hypercholesterolemia, unspecified: Secondary | ICD-10-CM | POA: Diagnosis not present

## 2021-10-26 DIAGNOSIS — G8929 Other chronic pain: Secondary | ICD-10-CM | POA: Diagnosis not present

## 2021-10-26 DIAGNOSIS — F909 Attention-deficit hyperactivity disorder, unspecified type: Secondary | ICD-10-CM | POA: Diagnosis not present

## 2021-10-26 DIAGNOSIS — R0600 Dyspnea, unspecified: Secondary | ICD-10-CM | POA: Diagnosis not present

## 2021-10-26 DIAGNOSIS — M549 Dorsalgia, unspecified: Secondary | ICD-10-CM | POA: Diagnosis not present

## 2021-10-26 DIAGNOSIS — R635 Abnormal weight gain: Secondary | ICD-10-CM | POA: Diagnosis not present

## 2021-10-26 DIAGNOSIS — Z79899 Other long term (current) drug therapy: Secondary | ICD-10-CM | POA: Diagnosis not present

## 2021-10-26 NOTE — Telephone Encounter (Signed)
Requested Prescriptions  Pending Prescriptions Disp Refills  . CRYSELLE-28 0.3-30 MG-MCG tablet [Pharmacy Med Name: CRYSELLE TABLETS 28] 84 tablet 1    Sig: TAKE 1 TABLET BY MOUTH EVERY DAY     OB/GYN:  Contraceptives Failed - 10/26/2021  3:38 AM      Failed - Last BP in normal range    BP Readings from Last 1 Encounters:  09/24/21 (!) 87/61         Passed - Valid encounter within last 12 months    Recent Outpatient Visits          1 month ago Breast cancer screening by mammogram   [facility] Jon Billings, NP   4 months ago Attention deficit hyperactivity disorder (ADHD), unspecified ADHD type   [facility] Jon Billings, NP   6 months ago Painful urination   [place] Vigg, Avanti, MD   7 months ago Attention deficit hyperactivity disorder (ADHD), unspecified ADHD type   [facility] Jon Billings, NP   10 months ago Attention deficit hyperactivity disorder (ADHD), unspecified ADHD type   [facility] Myles Gip, DO      Future Appointments            In 2 months Jon Billings, NP [facility], [place]

## 2021-10-28 DIAGNOSIS — Z79899 Other long term (current) drug therapy: Secondary | ICD-10-CM | POA: Diagnosis not present

## 2021-10-30 ENCOUNTER — Other Ambulatory Visit: Payer: Self-pay | Admitting: Nurse Practitioner

## 2021-10-30 NOTE — Telephone Encounter (Signed)
[facility] called and spoke to [place], [employer]chant [employer] officer about the refill(s) [employer] requested. Advised it was sent on 10/26/21 #84/0 refill(s). He states that pharmacy reached out as far as refilling approval for medication. I told him I will resend rx today so medication can be filled.   Requested Prescriptions  Pending Prescriptions Disp Refills   CRYSELLE-28 0.3-30 MG-MCG tablet [Pharmacy Med Name: CRYSELLE TABLETS 28] 84 tablet 0    Sig: TAKE 1 TABLET BY MOUTH EVERY DAY     OB/GYN:  Contraceptives Failed - 10/30/2021  8:04 AM      Failed - Last BP in normal range    BP Readings from Last 1 Encounters:  09/24/21 (!) 87/61          Passed - Valid encounter within last 12 months    Recent Outpatient Visits           1 month ago Breast cancer screening by mammogram   [facility] Jon Billings, NP   4 months ago Attention deficit hyperactivity disorder (ADHD), unspecified ADHD type   [facility] Jon Billings, NP   6 months ago Painful urination   [place] Vigg, Avanti, MD   7 months ago Attention deficit hyperactivity disorder (ADHD), unspecified ADHD type   [facility] Jon Billings, NP   10 months ago Attention deficit hyperactivity disorder (ADHD), unspecified ADHD type   [facility] Myles Gip, DO       Future Appointments             In 1 month Jon Billings, NP [facility], [place]

## 2021-11-15 ENCOUNTER — Other Ambulatory Visit: Payer: Self-pay | Admitting: Nurse Practitioner

## 2021-11-17 NOTE — Telephone Encounter (Signed)
Requested Prescriptions  Pending Prescriptions Disp Refills   omeprazole (PRILOSEC) 20 MG capsule [Pharmacy Med Name: OMEPRAZOLE 20MG  CAPSULES] 90 capsule 0    Sig: TAKE 1 CAPSULE(20 MG) BY MOUTH DAILY     Gastroenterology: Proton Pump Inhibitors Passed - 11/15/2021  3:37 AM      Passed - Valid encounter within last 12 months    Recent Outpatient Visits          1 month ago Breast cancer screening by mammogram   [facility] Jon Billings, NP   5 months ago Attention deficit hyperactivity disorder (ADHD), unspecified ADHD type   [facility] Jon Billings, NP   7 months ago Painful urination   [place] Vigg, Avanti, MD   8 months ago Attention deficit hyperactivity disorder (ADHD), unspecified ADHD type   [place], NP   11 months ago Attention deficit hyperactivity disorder (ADHD), unspecified ADHD type   [facility] Myles Gip, DO      Future Appointments            In 1 month Jon Billings, NP [facility], [place]

## 2021-11-19 ENCOUNTER — Encounter: Payer: Self-pay | Admitting: Nurse Practitioner

## 2021-11-20 MED ORDER — METHOCARBAMOL 500 MG PO TABS: ORAL_TABLET | ORAL | 1 refills | Status: DC

## 2021-11-20 NOTE — Telephone Encounter (Signed)
[facility] called saying there were no instructions on the prescription,  He ask if it could be resent or either corrected.

## 2021-11-24 MED ORDER — METHOCARBAMOL 500 MG PO TABS: ORAL_TABLET | ORAL | 1 refills | Status: DC

## 2021-11-30 DIAGNOSIS — E7800 Pure hypercholesterolemia, unspecified: Secondary | ICD-10-CM | POA: Diagnosis not present

## 2021-11-30 DIAGNOSIS — Z6834 Body mass index (BMI) 34.0-34.9, adult: Secondary | ICD-10-CM | POA: Diagnosis not present

## 2021-11-30 DIAGNOSIS — M549 Dorsalgia, unspecified: Secondary | ICD-10-CM | POA: Diagnosis not present

## 2021-11-30 DIAGNOSIS — Z79899 Other long term (current) drug therapy: Secondary | ICD-10-CM | POA: Diagnosis not present

## 2021-11-30 DIAGNOSIS — R635 Abnormal weight gain: Secondary | ICD-10-CM | POA: Diagnosis not present

## 2021-11-30 DIAGNOSIS — F909 Attention-deficit hyperactivity disorder, unspecified type: Secondary | ICD-10-CM | POA: Diagnosis not present

## 2021-11-30 DIAGNOSIS — Z6832 Body mass index (BMI) 32.0-32.9, adult: Secondary | ICD-10-CM | POA: Diagnosis not present

## 2021-11-30 DIAGNOSIS — G8929 Other chronic pain: Secondary | ICD-10-CM | POA: Diagnosis not present

## 2021-12-02 ENCOUNTER — Encounter: Payer: Self-pay | Admitting: Nurse Practitioner

## 2021-12-02 DIAGNOSIS — Z79899 Other long term (current) drug therapy: Secondary | ICD-10-CM | POA: Diagnosis not present

## 2021-12-02 MED ORDER — AMPHETAMINE-DEXTROAMPHETAMINE 20 MG PO TABS: 20.0000 mg | ORAL_TABLET | Freq: Two times a day (BID) | ORAL | 0 refills | Status: DC

## 2021-12-21 DIAGNOSIS — F413 Other mixed anxiety disorders: Secondary | ICD-10-CM | POA: Diagnosis not present

## 2021-12-21 DIAGNOSIS — R29898 Other symptoms and signs involving the musculoskeletal system: Secondary | ICD-10-CM | POA: Diagnosis not present

## 2021-12-21 DIAGNOSIS — M797 Fibromyalgia: Secondary | ICD-10-CM | POA: Diagnosis not present

## 2021-12-21 DIAGNOSIS — R4189 Other symptoms and signs involving cognitive functions and awareness: Secondary | ICD-10-CM | POA: Diagnosis not present

## 2021-12-28 ENCOUNTER — Encounter: Payer: Self-pay | Admitting: Nurse Practitioner

## 2021-12-28 ENCOUNTER — Telehealth (INDEPENDENT_AMBULATORY_CARE_PROVIDER_SITE_OTHER): Payer: Medicaid Other | Admitting: Nurse Practitioner

## 2021-12-28 ENCOUNTER — Other Ambulatory Visit: Payer: Self-pay | Admitting: Nurse Practitioner

## 2021-12-28 VITALS — Wt 182.0 lb

## 2021-12-28 DIAGNOSIS — F909 Attention-deficit hyperactivity disorder, unspecified type: Secondary | ICD-10-CM

## 2021-12-28 MED ORDER — AMPHETAMINE-DEXTROAMPHETAMINE 20 MG PO TABS: 20.0000 mg | ORAL_TABLET | Freq: Two times a day (BID) | ORAL | 0 refills | Status: DC

## 2021-12-28 NOTE — Assessment & Plan Note (Signed)
Chronic.  Controlled.  Continue with current medication regimen.  Return to clinic in 3 months in office for reevaluation.  Call sooner if concerns arise. PDMP checked. Refills sen for 3 months. Will need updated controlled substance agreement and UDS at next visit.

## 2021-12-28 NOTE — Progress Notes (Signed)
Wt 182 lb (82.6 kg)    BMI 31.15 kg/m    Subjective:    Patient ID: Tracy Whitaker, female    DOB: 11/01/1981, 41 y.o.   MRN: [id number]  HPI: Tracy Whitaker is a 41 y.o. female  Chief Complaint  Patient presents with   ADHD    Patient is here for follow up on ADHD.    ADHD FOLLOW UP Patient states she has no concerns.  Symptoms are well controlled on the medication. ADHD status: controlled Satisfied with current therapy: yes Medication compliance:  excellent compliance Controlled substance contract: yes Previous psychiatry evaluation:  yes Previous medications: no adderall   Taking meds on weekends/vacations: yes Work/school performance:  excellent Difficulty sustaining attention/completing tasks: no Distracted by extraneous stimuli: no Does not listen when spoken to: yes  Fidgets with hands or feet: yes Unable to stay in seat: no Blurts out/interrupts others:  occassional ADHD Medication Side Effects: no    Decreased appetite: no    Headache: no    Sleeping disturbance pattern: no    Irritability: no    Rebound effects (worse than baseline) off medication: no    Anxiousness: no    Dizziness: no    Tics: no  Relevant past medical, surgical, family and social history reviewed and updated as indicated. Interim medical history since our last visit reviewed. Allergies and medications reviewed and updated.  Review of Systems  Constitutional:  Negative for appetite change.  Neurological:  Negative for dizziness.  Psychiatric/Behavioral:  Positive for decreased concentration. Negative for agitation and sleep disturbance. The patient is not nervous/anxious.    Per HPI unless specifically indicated above     Objective:    Wt 182 lb (82.6 kg)    BMI 31.15 kg/m   Wt Readings from Last 3 Encounters:  12/28/21 182 lb (82.6 kg)  09/24/21 193 lb (87.5 kg)  06/16/21 201 lb (91.2 kg)    Physical Exam Vitals and nursing note reviewed.  HENT:     Head:  Normocephalic.     Right Ear: Hearing normal.     Left Ear: Hearing normal.     Nose: Nose normal.  Eyes:     Pupils: Pupils are equal, round, and reactive to light.  Pulmonary:     Effort: Pulmonary effort is normal. No respiratory distress.  Neurological:     Mental Status: She is alert.  Psychiatric:        Mood and Affect: Mood normal.        Behavior: Behavior normal.        Thought Content: Thought content normal.        Judgment: Judgment normal.    Results for orders placed or performed in visit on 04/07/21  Urine Culture   Specimen: Urine   UR  Result Value Ref Range   Urine Culture, Routine Final report (A)    Organism ID, Bacteria Escherichia coli (A)    Antimicrobial Susceptibility Comment   Microscopic Examination   Urine  Result Value Ref Range   WBC, UA 0-5 0 - 5 /hpf   RBC 0-2 0 - 2 /hpf   Epithelial Cells (non renal) 0-10 0 - 10 /hpf   Renal Epithel, UA 0-10 (A) None seen /hpf   Bacteria, UA Few None seen/Few  Urinalysis, Routine w reflex microscopic  Result Value Ref Range   Specific Gravity, UA CANCELED    pH, UA CANCELED    Color, UA Orange Yellow   Appearance Ur  Clear Clear   Protein,UA CANCELED    Glucose, UA CANCELED    Ketones, UA CANCELED    Microscopic Examination See below:       Assessment & Plan:   Problem List Items Addressed This Visit       Other   ADHD (attention deficit hyperactivity disorder) - Primary    Chronic.  Controlled.  Continue with current medication regimen.  Return to clinic in 3 months in office for reevaluation.  Call sooner if concerns arise. PDMP checked. Refills sen for 3 months. Will need updated controlled substance agreement and UDS at next visit.         Follow up plan: Return in about 3 months (around 03/27/2022) for ADHD FU (needs UDS and controlled substance agreement).   This visit was completed via MyChart due to the restrictions of the COVID-19 pandemic. All issues as above were discussed and  addressed. Physical exam was done as above through visual confirmation on MyChart. If it was felt that the patient should be evaluated in the office, they were directed there. The patient verbally consented to this visit. Location of the patient: Home Location of the provider: Office Those involved with this call:  Provider: Jon Billings, NP CMA: Yvonna Alanis, [place] Desk/Registration: Myrlene Broker This encounter was conducted via video.  I spent 15 dedicated to the care of this patient on the date of this encounter to include previsit review of 20, face to face time with the patient, and post visit ordering of testing.

## 2021-12-28 NOTE — Patient Instructions (Signed)
Please call to schedule your mammogram and/or bone density: °[facility]  °Address: [address], [place], [place] [address]  °Phone: [phone] ° °

## 2021-12-29 ENCOUNTER — Other Ambulatory Visit: Payer: Self-pay | Admitting: Nurse Practitioner

## 2021-12-29 DIAGNOSIS — R928 Other abnormal and inconclusive findings on diagnostic imaging of breast: Secondary | ICD-10-CM

## 2021-12-29 DIAGNOSIS — N6320 Unspecified lump in the left breast, unspecified quadrant: Secondary | ICD-10-CM

## 2021-12-30 ENCOUNTER — Other Ambulatory Visit: Payer: Self-pay | Admitting: Nurse Practitioner

## 2021-12-30 DIAGNOSIS — N6320 Unspecified lump in the left breast, unspecified quadrant: Secondary | ICD-10-CM

## 2021-12-30 DIAGNOSIS — R928 Other abnormal and inconclusive findings on diagnostic imaging of breast: Secondary | ICD-10-CM

## 2022-01-04 DIAGNOSIS — Z79899 Other long term (current) drug therapy: Secondary | ICD-10-CM | POA: Diagnosis not present

## 2022-01-04 DIAGNOSIS — E7800 Pure hypercholesterolemia, unspecified: Secondary | ICD-10-CM | POA: Diagnosis not present

## 2022-01-04 DIAGNOSIS — F909 Attention-deficit hyperactivity disorder, unspecified type: Secondary | ICD-10-CM | POA: Diagnosis not present

## 2022-01-04 DIAGNOSIS — G8929 Other chronic pain: Secondary | ICD-10-CM | POA: Diagnosis not present

## 2022-01-04 DIAGNOSIS — E559 Vitamin D deficiency, unspecified: Secondary | ICD-10-CM | POA: Diagnosis not present

## 2022-01-04 DIAGNOSIS — E611 Iron deficiency: Secondary | ICD-10-CM | POA: Diagnosis not present

## 2022-01-04 DIAGNOSIS — M549 Dorsalgia, unspecified: Secondary | ICD-10-CM | POA: Diagnosis not present

## 2022-01-04 DIAGNOSIS — Z6832 Body mass index (BMI) 32.0-32.9, adult: Secondary | ICD-10-CM | POA: Diagnosis not present

## 2022-01-04 DIAGNOSIS — R5383 Other fatigue: Secondary | ICD-10-CM | POA: Diagnosis not present

## 2022-01-04 DIAGNOSIS — M129 Arthropathy, unspecified: Secondary | ICD-10-CM | POA: Diagnosis not present

## 2022-01-06 DIAGNOSIS — Z79899 Other long term (current) drug therapy: Secondary | ICD-10-CM | POA: Diagnosis not present

## 2022-01-18 ENCOUNTER — Ambulatory Visit
Admission: RE | Admit: 2022-01-18 | Discharge: 2022-01-18 | Disposition: A | Discharge status: Home / Self Care | Payer: Medicaid Other | Source: Ambulatory Visit | Attending: Nurse Practitioner | Admitting: Nurse Practitioner

## 2022-01-18 ENCOUNTER — Other Ambulatory Visit: Payer: Self-pay

## 2022-01-18 DIAGNOSIS — N6320 Unspecified lump in the left breast, unspecified quadrant: Secondary | ICD-10-CM

## 2022-01-18 DIAGNOSIS — R928 Other abnormal and inconclusive findings on diagnostic imaging of breast: Secondary | ICD-10-CM

## 2022-01-18 DIAGNOSIS — R922 Inconclusive mammogram: Secondary | ICD-10-CM | POA: Diagnosis not present

## 2022-01-19 NOTE — Progress Notes (Signed)
Hi Tracy Whitaker.  Your Mammogram didn't show any signs of malignancy. Your do have multiple oil cysts in the upper left breast.  The recommendation is to repeat the mammogram in 1 year.

## 2022-02-01 ENCOUNTER — Encounter: Payer: Self-pay | Admitting: Gastroenterology

## 2022-02-08 DIAGNOSIS — M797 Fibromyalgia: Secondary | ICD-10-CM | POA: Diagnosis not present

## 2022-02-08 DIAGNOSIS — M549 Dorsalgia, unspecified: Secondary | ICD-10-CM | POA: Diagnosis not present

## 2022-02-08 DIAGNOSIS — R251 Tremor, unspecified: Secondary | ICD-10-CM

## 2022-02-08 DIAGNOSIS — R635 Abnormal weight gain: Secondary | ICD-10-CM | POA: Diagnosis not present

## 2022-02-08 DIAGNOSIS — R208 Other disturbances of skin sensation: Secondary | ICD-10-CM | POA: Diagnosis not present

## 2022-02-08 DIAGNOSIS — G479 Sleep disorder, unspecified: Secondary | ICD-10-CM | POA: Insufficient documentation

## 2022-02-08 DIAGNOSIS — R531 Weakness: Secondary | ICD-10-CM | POA: Diagnosis not present

## 2022-02-08 DIAGNOSIS — R200 Anesthesia of skin: Secondary | ICD-10-CM | POA: Diagnosis not present

## 2022-02-08 DIAGNOSIS — G8929 Other chronic pain: Secondary | ICD-10-CM | POA: Diagnosis not present

## 2022-02-08 DIAGNOSIS — Z79899 Other long term (current) drug therapy: Secondary | ICD-10-CM | POA: Diagnosis not present

## 2022-02-08 DIAGNOSIS — E7800 Pure hypercholesterolemia, unspecified: Secondary | ICD-10-CM | POA: Diagnosis not present

## 2022-02-08 DIAGNOSIS — Z6833 Body mass index (BMI) 33.0-33.9, adult: Secondary | ICD-10-CM | POA: Diagnosis not present

## 2022-02-08 HISTORY — DX: Sleep disorder, unspecified: G47.9

## 2022-02-08 HISTORY — DX: Tremor, unspecified: R25.1

## 2022-02-10 DIAGNOSIS — Z79899 Other long term (current) drug therapy: Secondary | ICD-10-CM | POA: Diagnosis not present

## 2022-03-24 DIAGNOSIS — R29898 Other symptoms and signs involving the musculoskeletal system: Secondary | ICD-10-CM | POA: Diagnosis not present

## 2022-03-24 DIAGNOSIS — R200 Anesthesia of skin: Secondary | ICD-10-CM | POA: Diagnosis not present

## 2022-03-24 DIAGNOSIS — M797 Fibromyalgia: Secondary | ICD-10-CM | POA: Diagnosis not present

## 2022-03-24 DIAGNOSIS — R4189 Other symptoms and signs involving cognitive functions and awareness: Secondary | ICD-10-CM | POA: Diagnosis not present

## 2022-03-24 DIAGNOSIS — R531 Weakness: Secondary | ICD-10-CM | POA: Diagnosis not present

## 2022-03-24 DIAGNOSIS — R609 Edema, unspecified: Secondary | ICD-10-CM | POA: Diagnosis not present

## 2022-03-24 DIAGNOSIS — R208 Other disturbances of skin sensation: Secondary | ICD-10-CM | POA: Diagnosis not present

## 2022-03-24 DIAGNOSIS — R251 Tremor, unspecified: Secondary | ICD-10-CM | POA: Diagnosis not present

## 2022-03-24 DIAGNOSIS — G479 Sleep disorder, unspecified: Secondary | ICD-10-CM | POA: Diagnosis not present

## 2022-03-24 DIAGNOSIS — F413 Other mixed anxiety disorders: Secondary | ICD-10-CM | POA: Diagnosis not present

## 2022-03-24 DIAGNOSIS — R202 Paresthesia of skin: Secondary | ICD-10-CM | POA: Diagnosis not present

## 2022-03-29 ENCOUNTER — Ambulatory Visit: Payer: Medicaid Other | Admitting: Nurse Practitioner

## 2022-03-29 ENCOUNTER — Encounter: Payer: Self-pay | Admitting: Nurse Practitioner

## 2022-03-29 VITALS — BP 119/83 | HR 84 | Temp 98.9°F | Wt 188.8 lb

## 2022-03-29 DIAGNOSIS — F909 Attention-deficit hyperactivity disorder, unspecified type: Secondary | ICD-10-CM

## 2022-03-29 DIAGNOSIS — Z79899 Other long term (current) drug therapy: Secondary | ICD-10-CM | POA: Diagnosis not present

## 2022-03-29 DIAGNOSIS — M797 Fibromyalgia: Secondary | ICD-10-CM | POA: Diagnosis not present

## 2022-03-29 DIAGNOSIS — N39498 Other specified urinary incontinence: Secondary | ICD-10-CM | POA: Diagnosis not present

## 2022-03-29 MED ORDER — AMPHETAMINE-DEXTROAMPHETAMINE 20 MG PO TABS: 20.0000 mg | ORAL_TABLET | Freq: Two times a day (BID) | ORAL | 0 refills | Status: DC

## 2022-03-29 NOTE — Progress Notes (Signed)
? ?BP 119/83   Pulse 84   Temp 98.9 ?F (37.2 ?C) (Oral)   Wt 188 lb 12.8 oz (85.6 kg)   LMP 03/15/2022 (Approximate)   SpO2 95%   BMI 32.31 kg/m?   ? ?Subjective:  ? ? Patient ID: Tracy Whitaker, female    DOB: Jan 11, 1981, 41 y.o.   MRN: [id number] ? ?HPI: ?FRIEDA ARNALL is a 41 y.o. female ? ?Chief Complaint  ?Patient presents with  ? ADHD  ? Urinary Incontinence  ?  Ongoing x several months per patient. Patient has had episodes of urinating on herself recently. Spoke with urologist and told her to speak with PCP.  ? ?Would like to discuss getting off pain medication and maybe switching to muscle relaxer   ? ?ADHD FOLLOW UP ?Patient states she has no concerns.  Symptoms are well controlled on the medication. She feels like this is a good dose for her.  ?ADHD status: controlled ?Satisfied with current therapy: yes ?Medication compliance:  excellent compliance ?Controlled substance contract: yes ?Previous psychiatry evaluation:  yes ?Previous medications: no adderall   ?Taking meds on weekends/vacations: yes ?Work/school performance:  excellent ?Difficulty sustaining attention/completing tasks: no ?Distracted by extraneous stimuli: no ?Does not listen when spoken to: yes  ?Fidgets with hands or feet: yes ?Unable to stay in seat: no ?Blurts out/interrupts others:  occassional ?ADHD Medication Side Effects: no ?   Decreased appetite: no ?   Headache: no ?   Sleeping disturbance pattern: no ?   Irritability: no ?   Rebound effects (worse than baseline) off medication: no ?   Anxiousness: no ?   Dizziness: no ?   Tics: no ? ?Patient has been having urinary incontinence for about 6-8 months.  Patient has urinated on herself about 5 times.  Has not lost control of her bowel movements.  Denies pain with urination.  States she gets in the bathroom then the symptoms start.  States the urge to urinate hits her suddenly and not able to get to the bathroom quick enough. She is on Nortriptyline which has helped with  her symptoms.  ? ?Relevant past medical, surgical, family and social history reviewed and updated as indicated. Interim medical history since our last visit reviewed. ?Allergies and medications reviewed and updated. ? ?Review of Systems  ?Constitutional:  Negative for appetite change.  ?Genitourinary:  Positive for urgency.  ?     Urinary incontinent.  ?Neurological:  Negative for dizziness.  ?Psychiatric/Behavioral:  Positive for decreased concentration. Negative for agitation and sleep disturbance. The patient is not nervous/anxious.   ? ?Per HPI unless specifically indicated above ? ?   ?Objective:  ?  ?BP 119/83   Pulse 84   Temp 98.9 ?F (37.2 ?C) (Oral)   Wt 188 lb 12.8 oz (85.6 kg)   LMP 03/15/2022 (Approximate)   SpO2 95%   BMI 32.31 kg/m?   ?Wt Readings from Last 3 Encounters:  ?03/29/22 188 lb 12.8 oz (85.6 kg)  ?12/28/21 182 lb (82.6 kg)  ?09/24/21 193 lb (87.5 kg)  ?  ?Physical Exam ?Vitals and nursing note reviewed.  ?Constitutional:   ?   General: She is not in acute distress. ?   Appearance: Normal appearance. She is normal weight. She is not ill-appearing, toxic-appearing or diaphoretic.  ?HENT:  ?   Head: Normocephalic.  ?   Right Ear: External ear normal.  ?   Left Ear: External ear normal.  ?   Nose: Nose normal.  ?  Mouth/Throat:  ?   Mouth: Mucous membranes are moist.  ?   Pharynx: Oropharynx is clear.  ?Eyes:  ?   General:     ?   Right eye: No discharge.     ?   Left eye: No discharge.  ?   Extraocular Movements: Extraocular movements intact.  ?   Conjunctiva/sclera: Conjunctivae normal.  ?   Pupils: Pupils are equal, round, and reactive to light.  ?Cardiovascular:  ?   Rate and Rhythm: Normal rate and regular rhythm.  ?   Heart sounds: No murmur heard. ?Pulmonary:  ?   Effort: Pulmonary effort is normal. No respiratory distress.  ?   Breath sounds: Normal breath sounds. No wheezing or rales.  ?Musculoskeletal:  ?   Cervical back: Normal range of motion and neck supple.  ?Skin: ?    General: Skin is warm and dry.  ?   Capillary Refill: Capillary refill takes less than 2 seconds.  ?Neurological:  ?   General: No focal deficit present.  ?   Mental Status: She is alert and oriented to person, place, and time. Mental status is at baseline.  ?Psychiatric:     ?   Mood and Affect: Mood normal.     ?   Behavior: Behavior normal.     ?   Thought Content: Thought content normal.     ?   Judgment: Judgment normal.  ? ? ?Results for orders placed or performed in visit on 04/07/21  ?Urine Culture  ? Specimen: Urine  ? UR  ?Result Value Ref Range  ? Urine Culture, Routine Final report (A)   ? Organism ID, Bacteria Escherichia coli (A)   ? Antimicrobial Susceptibility Comment   ?Microscopic Examination  ? Urine  ?Result Value Ref Range  ? WBC, UA 0-5 0 - 5 /hpf  ? RBC 0-2 0 - 2 /hpf  ? Epithelial Cells (non renal) 0-10 0 - 10 /hpf  ? Renal Epithel, UA 0-10 (A) None seen /hpf  ? Bacteria, UA Few None seen/Few  ?Urinalysis, Routine w reflex microscopic  ?Result Value Ref Range  ? Specific Gravity, UA CANCELED   ? pH, UA CANCELED   ? Color, UA Orange Yellow  ? Appearance Ur Clear Clear  ? Protein,UA CANCELED   ? Glucose, UA CANCELED   ? Ketones, UA CANCELED   ? Microscopic Examination See below:   ? ?   ?Assessment & Plan:  ? ?Problem List Items Addressed This Visit   ? ?  ? Other  ? ADHD (attention deficit hyperactivity disorder) - Primary  ?  Chronic.  Controlled.  Continue with current medication regimen.  Return to clinic in 3 months in office for reevaluation.  Call sooner if concerns arise. PDMP checked. Refills sen for 3 months. UDS and controlled substance agreement updated at visit today.  ? ? ?  ?  ? Relevant Orders  ? [id number] 11+Oxyco+Alc+Crt-Bund  ? Comp Met (CMET)  ? Fibromyalgia  ?  Chronic. Patient currently on Tramadol. Neurologist who prescribed Klonopin does not want her on Klonopin TID and Tramadol. She is having to take daily Aleve.  Advised her that this is not recommended to be taken daily.   Recommend she speak to Neurologist about other medication alternatives. CMP checked at visit today. ? ?  ?  ? Controlled substance agreement signed  ?  Chronic.  Controlled.  Continue with current medication regimen.  Return to clinic in 3 months in office for reevaluation.  Call sooner if concerns arise. PDMP checked. Refills sen for 3 months. UDS and controlled substance agreement updated at visit today.  ?  ?  ? Relevant Orders  ? [id number] 11+Oxyco+Alc+Crt-Bund  ? Comp Met (CMET)  ? ?Other Visit Diagnoses   ? ? Other urinary incontinence      ? Referral placed for patien to see Urology. She is on Nortriptyline which improved symptoms.    ? Relevant Orders  ? Ambulatory referral to Urology  ? Comp Met (CMET)  ? ?  ?  ? ?Follow up plan: ?Return in about 3 months (around 06/29/2022) for Depression/Anxiety FU (virtual. ? ? ? ?A total of 30 minutes were spent on this encounter today.  When total time is documented, this includes both the face-to-face and non-face-to-face time personally spent before, during and after the visit on the date of the encounter of medications, symptoms, visits with specialists, and follow ups.   ? ? ?

## 2022-03-29 NOTE — Assessment & Plan Note (Signed)
Chronic.  Controlled.  Continue with current medication regimen.  Return to clinic in 3 months in office for reevaluation.  Call sooner if concerns arise. PDMP checked. Refills sen for 3 months. UDS and controlled substance agreement updated at visit today.  ? ?

## 2022-03-29 NOTE — Assessment & Plan Note (Signed)
Chronic.  Controlled.  Continue with current medication regimen.  Return to clinic in 3 months in office for reevaluation.  Call sooner if concerns arise. PDMP checked. Refills sen for 3 months. UDS and controlled substance agreement updated at visit today.  ?

## 2022-03-29 NOTE — Assessment & Plan Note (Signed)
Chronic. Patient currently on Tramadol. Neurologist who prescribed Klonopin does not want her on Klonopin TID and Tramadol. She is having to take daily Aleve.  Advised her that this is not recommended to be taken daily.  Recommend she speak to Neurologist about other medication alternatives. CMP checked at visit today. ?

## 2022-03-30 LAB — COMPREHENSIVE METABOLIC PANEL
ALT: 18 IU/L (ref 0–32)
AST: 13 IU/L (ref 0–40)
Albumin/Globulin Ratio: 1.7 (ref 1.2–2.2)
Albumin: 4.3 g/dL (ref 3.8–4.8)
Alkaline Phosphatase: 66 IU/L (ref 44–121)
BUN/Creatinine Ratio: 7 — ABNORMAL LOW (ref 9–23)
BUN: 6 mg/dL (ref 6–24)
Bilirubin Total: 0.3 mg/dL (ref 0.0–1.2)
CO2: 20 mmol/L (ref 20–29)
Calcium: 9.1 mg/dL (ref 8.7–10.2)
Chloride: 108 mmol/L — ABNORMAL HIGH (ref 96–106)
Creatinine, Ser: 0.87 mg/dL (ref 0.57–1.00)
Globulin, Total: 2.5 g/dL (ref 1.5–4.5)
Glucose: 78 mg/dL (ref 70–99)
Potassium: 3.7 mmol/L (ref 3.5–5.2)
Sodium: 142 mmol/L (ref 134–144)
Total Protein: 6.8 g/dL (ref 6.0–8.5)
eGFR: 86 mL/min/{1.73_m2} (ref >59)

## 2022-03-30 NOTE — Progress Notes (Signed)
HI Catherene. Your liver, kidneys and electrolytes look good.  We will continue to monitor in the future.

## 2022-04-01 ENCOUNTER — Other Ambulatory Visit: Payer: Self-pay | Admitting: Nurse Practitioner

## 2022-04-01 NOTE — Telephone Encounter (Signed)
Requested Prescriptions  ?Pending Prescriptions Disp Refills  ?? acyclovir (ZOVIRAX) 800 MG tablet [Pharmacy Med Name: ACYCLOVIR 800MG TABLETS] 180 tablet 0  ?  Sig: TAKE 1 TABLET(800 MG) BY MOUTH TWICE DAILY  ?  ? Antimicrobials:  Antiviral Agents - Anti-Herpetic Passed - 04/01/2022  6:21 AM  ?  ?  Passed - Valid encounter within last 12 months  ?  Recent Outpatient Visits   ?      ? 3 days ago Attention deficit hyperactivity disorder (ADHD), unspecified ADHD type  ? [place], NP  ? 3 months ago Attention deficit hyperactivity disorder (ADHD), unspecified ADHD type  ? [place], NP  ? 6 months ago Breast cancer screening by mammogram  ? [facility] Jon Billings, NP  ? 9 months ago Attention deficit hyperactivity disorder (ADHD), unspecified ADHD type  ? [place], NP  ? 11 months ago Painful urination  ? [facility] Vigg, Avanti, MD  ?  ?  ?Future Appointments   ?        ? In 1 month MacDiarmid, Nicki Reaper, MD [place]  ? In 3 months Jon Billings, NP [facility]  ?  ? ?  ?  ?  ? ? ?

## 2022-04-06 LAB — DRUG SCREEN 764883 11+OXYCO+ALC+CRT-BUND
BENZODIAZ UR QL: NEGATIVE ng/mL
Barbiturate: NEGATIVE ng/mL
Cocaine (Metabolite): NEGATIVE ng/mL
Creatinine: 32.7 mg/dL (ref 20.0–300.0)
Ethanol: NEGATIVE %
Meperidine: NEGATIVE ng/mL
Methadone Screen, Urine: NEGATIVE ng/mL
OPIATE SCREEN URINE: NEGATIVE ng/mL
Oxycodone/Oxymorphone, Urine: NEGATIVE ng/mL
Phencyclidine: NEGATIVE ng/mL
Propoxyphene: NEGATIVE ng/mL
pH, Urine: 6.6 (ref 4.5–8.9)

## 2022-04-06 LAB — TRAMADOL GC/MS, URINE
Tramadol gc/ms Conf: 4095 ng/mL
Tramadol: POSITIVE — AB

## 2022-04-06 LAB — DRUG PROFILE 799016
Amphetamine GC/MS Conf: 2061 ng/mL
Amphetamine: POSITIVE — AB
Amphetamines: POSITIVE — AB
Methamphetamine: NEGATIVE

## 2022-04-06 LAB — CANNABINOID CONFIRMATION, UR: CANNABINOIDS: NEGATIVE

## 2022-04-06 NOTE — Progress Notes (Signed)
Hi Tracy Whitaker.  Your drug screen looks good.  No concerns at this time.

## 2022-04-09 ENCOUNTER — Other Ambulatory Visit: Payer: Self-pay | Admitting: Nurse Practitioner

## 2022-04-09 NOTE — Telephone Encounter (Signed)
Requested Prescriptions  Pending Prescriptions Disp Refills  . CRYSELLE-28 0.3-30 MG-MCG tablet [Pharmacy Med Name: CRYSELLE TABLETS 28] 84 tablet 3    Sig: TAKE 1 TABLET BY MOUTH EVERY DAY     OB/GYN:  Contraceptives Passed - 04/09/2022 11:20 AM      Passed - Last BP in normal range    BP Readings from Last 1 Encounters:  03/29/22 119/83         Passed - Valid encounter within last 12 months    Recent Outpatient Visits          1 week ago Attention deficit hyperactivity disorder (ADHD), unspecified ADHD type   [facility] Jon Billings, NP   3 months ago Attention deficit hyperactivity disorder (ADHD), unspecified ADHD type   [facility] Jon Billings, NP   6 months ago Breast cancer screening by mammogram   [facility] Jon Billings, NP   9 months ago Attention deficit hyperactivity disorder (ADHD), unspecified ADHD type   [place], NP   1 year ago Painful urination   [facility] Vigg, Avanti, MD      Future Appointments            In 1 month MacDiarmid, Nicki Reaper, MD [place]   In 2 months Jon Billings, NP [facility], [place] - Patient is not a smoker

## 2022-04-12 DIAGNOSIS — Z6831 Body mass index (BMI) 31.0-31.9, adult: Secondary | ICD-10-CM | POA: Diagnosis not present

## 2022-04-12 DIAGNOSIS — R7989 Other specified abnormal findings of blood chemistry: Secondary | ICD-10-CM | POA: Diagnosis not present

## 2022-04-12 DIAGNOSIS — G8929 Other chronic pain: Secondary | ICD-10-CM | POA: Diagnosis not present

## 2022-04-12 DIAGNOSIS — R635 Abnormal weight gain: Secondary | ICD-10-CM | POA: Diagnosis not present

## 2022-04-12 DIAGNOSIS — M549 Dorsalgia, unspecified: Secondary | ICD-10-CM | POA: Diagnosis not present

## 2022-04-12 DIAGNOSIS — E611 Iron deficiency: Secondary | ICD-10-CM | POA: Diagnosis not present

## 2022-04-12 DIAGNOSIS — E7800 Pure hypercholesterolemia, unspecified: Secondary | ICD-10-CM | POA: Diagnosis not present

## 2022-04-12 DIAGNOSIS — E559 Vitamin D deficiency, unspecified: Secondary | ICD-10-CM | POA: Diagnosis not present

## 2022-04-12 DIAGNOSIS — Z79899 Other long term (current) drug therapy: Secondary | ICD-10-CM | POA: Diagnosis not present

## 2022-04-15 DIAGNOSIS — Z79899 Other long term (current) drug therapy: Secondary | ICD-10-CM | POA: Diagnosis not present

## 2022-05-04 ENCOUNTER — Other Ambulatory Visit: Payer: Self-pay | Admitting: Nurse Practitioner

## 2022-05-04 NOTE — Telephone Encounter (Signed)
Requested medication (s) are due for refill today - provider review   Requested medication (s) are on the active medication list -yes  Future visit scheduled -yes  Last refill: 11/24/21 #90 1RF  Notes to clinic: non delegated Rx  Requested Prescriptions  Pending Prescriptions Disp Refills   methocarbamol (ROBAXIN) 500 MG tablet [Pharmacy Med Name: METHOCARBAMOL 500MG TABLETS] 90 tablet 1    Sig: TAKE 1 TABLET BY MOUTH EVERY 8 HOURS AS NEEDED FOR MUSCLE SPASMS     Not Delegated - Analgesics:  Muscle Relaxants Failed - 05/04/2022  6:20 AM      Failed - This refill cannot be delegated      Passed - Valid encounter within last 6 months    Recent Outpatient Visits           1 month ago Attention deficit hyperactivity disorder (ADHD), unspecified ADHD type   [facility] Jon Billings, NP   4 months ago Attention deficit hyperactivity disorder (ADHD), unspecified ADHD type   [facility] Jon Billings, NP   7 months ago Breast cancer screening by mammogram   [facility] Jon Billings, NP   10 months ago Attention deficit hyperactivity disorder (ADHD), unspecified ADHD type   [facility] Jon Billings, NP   1 year ago Painful urination   [place], MD       Future Appointments             In 1 week Stoioff, Ronda Fairly, MD [place]   In 2 months Jon Billings, NP [place], [place]               Requested Prescriptions  Pending Prescriptions Disp Refills   methocarbamol (ROBAXIN) 500 MG tablet [Pharmacy Med Name: METHOCARBAMOL 500MG TABLETS] 90 tablet 1    Sig: TAKE 1 TABLET BY MOUTH EVERY 8 HOURS AS NEEDED FOR MUSCLE SPASMS     Not Delegated - Analgesics:  Muscle Relaxants Failed - 05/04/2022  6:20 AM      Failed - This refill cannot be delegated      Passed - Valid encounter within last 6 months    Recent Outpatient Visits           1  month ago Attention deficit hyperactivity disorder (ADHD), unspecified ADHD type   [facility] Jon Billings, NP   4 months ago Attention deficit hyperactivity disorder (ADHD), unspecified ADHD type   [facility] Jon Billings, NP   7 months ago Breast cancer screening by mammogram   [facility] Jon Billings, NP   10 months ago Attention deficit hyperactivity disorder (ADHD), unspecified ADHD type   [facility] Jon Billings, NP   1 year ago Painful urination   [place], MD       Future Appointments             In 1 week Stoioff, Ronda Fairly, MD [place]   In 2 months Jon Billings, NP [facility], [place]

## 2022-05-17 ENCOUNTER — Ambulatory Visit: Payer: Medicaid Other | Admitting: Urology

## 2022-05-17 ENCOUNTER — Encounter: Payer: Self-pay | Admitting: Urology

## 2022-06-14 DIAGNOSIS — G8929 Other chronic pain: Secondary | ICD-10-CM | POA: Diagnosis not present

## 2022-06-14 DIAGNOSIS — Z79899 Other long term (current) drug therapy: Secondary | ICD-10-CM | POA: Diagnosis not present

## 2022-06-14 DIAGNOSIS — M549 Dorsalgia, unspecified: Secondary | ICD-10-CM | POA: Diagnosis not present

## 2022-06-14 DIAGNOSIS — R635 Abnormal weight gain: Secondary | ICD-10-CM | POA: Diagnosis not present

## 2022-06-14 DIAGNOSIS — E559 Vitamin D deficiency, unspecified: Secondary | ICD-10-CM | POA: Diagnosis not present

## 2022-06-14 DIAGNOSIS — Z6833 Body mass index (BMI) 33.0-33.9, adult: Secondary | ICD-10-CM | POA: Diagnosis not present

## 2022-06-14 DIAGNOSIS — L239 Allergic contact dermatitis, unspecified cause: Secondary | ICD-10-CM | POA: Diagnosis not present

## 2022-06-14 DIAGNOSIS — E611 Iron deficiency: Secondary | ICD-10-CM | POA: Diagnosis not present

## 2022-06-14 DIAGNOSIS — E7800 Pure hypercholesterolemia, unspecified: Secondary | ICD-10-CM | POA: Diagnosis not present

## 2022-06-14 DIAGNOSIS — R7989 Other specified abnormal findings of blood chemistry: Secondary | ICD-10-CM | POA: Diagnosis not present

## 2022-06-14 DIAGNOSIS — F419 Anxiety disorder, unspecified: Secondary | ICD-10-CM | POA: Diagnosis not present

## 2022-06-14 DIAGNOSIS — F909 Attention-deficit hyperactivity disorder, unspecified type: Secondary | ICD-10-CM | POA: Diagnosis not present

## 2022-06-16 DIAGNOSIS — Z79899 Other long term (current) drug therapy: Secondary | ICD-10-CM | POA: Diagnosis not present

## 2022-06-18 ENCOUNTER — Other Ambulatory Visit: Payer: Self-pay | Admitting: Nurse Practitioner

## 2022-06-21 NOTE — Telephone Encounter (Signed)
Requested medications are due for refill today.  yes  Requested medications are on the active medications list.  yes  Last refill. 11/24/2021 #90 1 refill  Future visit scheduled.   yes  Notes to clinic.  Refill not delegated.    Requested Prescriptions  Pending Prescriptions Disp Refills   methocarbamol (ROBAXIN) 500 MG tablet [Pharmacy Med Name: METHOCARBAMOL 500MG TABLETS] 90 tablet 1    Sig: TAKE 1 TABLET BY MOUTH EVERY 8 HOURS AS NEEDED FOR MUSCLE SPASMS     Not Delegated - Analgesics:  Muscle Relaxants Failed - 06/18/2022  1:07 PM      Failed - This refill cannot be delegated      Passed - Valid encounter within last 6 months    Recent Outpatient Visits           2 months ago Attention deficit hyperactivity disorder (ADHD), unspecified ADHD type   [facility] Jon Billings, NP   5 months ago Attention deficit hyperactivity disorder (ADHD), unspecified ADHD type   [facility] Jon Billings, NP   9 months ago Breast cancer screening by mammogram   [facility] Jon Billings, NP   1 year ago Attention deficit hyperactivity disorder (ADHD), unspecified ADHD type   [facility] Jon Billings, NP   1 year ago Painful urination   [place] Vigg, Avanti, MD       Future Appointments             In 2 weeks Jon Billings, NP [facility], [place]

## 2022-06-24 DIAGNOSIS — R251 Tremor, unspecified: Secondary | ICD-10-CM | POA: Diagnosis not present

## 2022-06-24 DIAGNOSIS — R609 Edema, unspecified: Secondary | ICD-10-CM | POA: Diagnosis not present

## 2022-06-24 DIAGNOSIS — M797 Fibromyalgia: Secondary | ICD-10-CM | POA: Diagnosis not present

## 2022-06-24 DIAGNOSIS — R208 Other disturbances of skin sensation: Secondary | ICD-10-CM | POA: Diagnosis not present

## 2022-06-24 DIAGNOSIS — R4189 Other symptoms and signs involving cognitive functions and awareness: Secondary | ICD-10-CM | POA: Diagnosis not present

## 2022-06-24 DIAGNOSIS — G479 Sleep disorder, unspecified: Secondary | ICD-10-CM | POA: Diagnosis not present

## 2022-06-24 DIAGNOSIS — R531 Weakness: Secondary | ICD-10-CM | POA: Diagnosis not present

## 2022-06-24 DIAGNOSIS — R200 Anesthesia of skin: Secondary | ICD-10-CM | POA: Diagnosis not present

## 2022-06-24 DIAGNOSIS — R29898 Other symptoms and signs involving the musculoskeletal system: Secondary | ICD-10-CM | POA: Diagnosis not present

## 2022-06-24 DIAGNOSIS — R202 Paresthesia of skin: Secondary | ICD-10-CM | POA: Diagnosis not present

## 2022-06-24 DIAGNOSIS — F413 Other mixed anxiety disorders: Secondary | ICD-10-CM | POA: Diagnosis not present

## 2022-07-05 ENCOUNTER — Telehealth (INDEPENDENT_AMBULATORY_CARE_PROVIDER_SITE_OTHER): Payer: Medicaid Other | Admitting: Physician Assistant

## 2022-07-05 ENCOUNTER — Encounter: Payer: Self-pay | Admitting: Physician Assistant

## 2022-07-05 VITALS — Wt 194.0 lb

## 2022-07-05 DIAGNOSIS — F909 Attention-deficit hyperactivity disorder, unspecified type: Secondary | ICD-10-CM

## 2022-07-05 DIAGNOSIS — M797 Fibromyalgia: Secondary | ICD-10-CM | POA: Diagnosis not present

## 2022-07-05 DIAGNOSIS — M25562 Pain in left knee: Secondary | ICD-10-CM | POA: Diagnosis not present

## 2022-07-05 MED ORDER — AMPHETAMINE-DEXTROAMPHETAMINE 20 MG PO TABS: 20.0000 mg | ORAL_TABLET | Freq: Two times a day (BID) | ORAL | 0 refills | Status: DC

## 2022-07-05 NOTE — Assessment & Plan Note (Signed)
Chronic, appears well controlled on current medication She is taking Adderall 20 mg PO BID for symptoms and reports excellent control without concern for side effects at this time. Will send in one month supply - PCP to resume management after that PDMP reviewed, no evidence of worrisome controlled substance use patterns at this time  Follow up in 3 months for monitoring

## 2022-07-05 NOTE — Assessment & Plan Note (Signed)
Chronic, historic condition Reports she previously had some relief with Chiropractic collaboration and would like referral to these services  Referral placed today for [facility] per patient request

## 2022-07-05 NOTE — Progress Notes (Signed)
Virtual Visit via Video Note  I connected with Tracy Whitaker on 07/05/22 at  2:40 PM EDT by a video enabled telemedicine application and verified that I am speaking with the correct person using two identifiers.  Today's Provider: Talitha Givens, MHS, PA-C Introduced myself to the patient as a PA-C and provided education on APPs in clinical practice.    Location: Patient: At home, [place], [place]  Provider: [place], [place]    I discussed the limitations of evaluation and management by telemedicine and the availability of in person appointments. The patient expressed understanding and agreed to proceed.  Chief Complaint  Patient presents with   ADHD    3 month f/up   Depression    3 month f/up   Elbow Pain    Pt states she has been having elbow pain and she was referred to [facility] by Dr. Melrose Nakayama. States that the referral has to come from her PCP in order to be covered by insurance.      History of Present Illness:   Depression        Associated symptoms include no headaches.  ADHD   Reports she taking Adderall 20 mg fast acting twice per day Reports she is satisfied on current dose  States notices the first dose wearing off around mid-day and takes her second dose Reports second dose works well into the evening Denies concerns with sleeping  Denies appetite suppression, anxiety, chest pain, palpitations, headache She has active controlled substance agreement on file    Reports she would like a referral to Ortho for her knee  States her insurance requires referral from PCP office to accept.  Reports her Neurologist would like her to see [facility] for this   Reports she would like a referral for chiropractor to assist with Fibromyalgia management  Would like to resume sessions with [facility]       Review of Systems  Constitutional:  Negative for weight loss.  Cardiovascular:  Negative for chest pain and palpitations.   Neurological:  Negative for dizziness and headaches.  Psychiatric/Behavioral:  Positive for depression.        Observations/Objective:   Due to the nature of the virtual visit, physical exam and observations are limited. Able to obtain the following observations:  Alert, oriented, Appears comfortable, in no acute distress.  No scleral injection, no appreciated hoarseness, tachypnea, wheeze or strider. Able to maintain conversation without visible strain.  No cough appreciated during visit.    Assessment and Plan:  Problem List Items Addressed This Visit       Other   ADHD (attention deficit hyperactivity disorder) - Primary    Chronic, appears well controlled on current medication She is taking Adderall 20 mg PO BID for symptoms and reports excellent control without concern for side effects at this time. Will send in one month supply - PCP to resume management after that PDMP reviewed, no evidence of worrisome controlled substance use patterns at this time  Follow up in 3 months for monitoring       Relevant Medications   amphetamine-dextroamphetamine (ADDERALL) 20 MG tablet   Fibromyalgia    Chronic, historic condition Reports she previously had some relief with Chiropractic collaboration and would like referral to these services  Referral placed today for [facility] per patient request                Other Relevant Orders   Ambulatory referral to Chiropractic  Other Visit Diagnoses     Left knee pain, unspecified chronicity     Patient reports chronic knee pain and states she was advised by Neurologist to be evaluated by Ortho Reviewed chart and Neuro placed referral earlier this month- patient states ref must come from PCP office for insurance to accept Ortho referral to [facility] clinic placed today    Relevant Orders   Ambulatory referral to Orthopedic Surgery      Follow Up Instructions:    I discussed the assessment and treatment plan  with the patient. The patient was provided an opportunity to ask questions and all were answered. The patient agreed with the plan and demonstrated an understanding of the instructions.   The patient was advised to call back or seek an in-person evaluation if the symptoms worsen or if the condition fails to improve as anticipated.  I provided 13 minutes of non-face-to-face time during this encounter.  Return in about 3 months (around 10/05/2022) for ADHD .   I, Ameya Kutz E Jailani Hogans, PA-C, have reviewed all documentation for this visit. The documentation on 07/05/22 for the exam, diagnosis, procedures, and orders are all accurate and complete.   Talitha Givens, MHS, PA-C [facility]

## 2022-07-07 ENCOUNTER — Other Ambulatory Visit: Payer: Self-pay | Admitting: Nurse Practitioner

## 2022-07-07 NOTE — Telephone Encounter (Signed)
Requested Prescriptions  Pending Prescriptions Disp Refills  . spironolactone (ALDACTONE) 100 MG tablet [Pharmacy Med Name: SPIRONOLACTONE 100MG TABLETS] 90 tablet 1    Sig: TAKE 1 TABLET(100 MG) BY MOUTH DAILY     Cardiovascular: Diuretics - Aldosterone Antagonist Passed - 07/07/2022  6:23 AM      Passed - Cr in normal range and within 180 days    Creatinine  Date Value Ref Range Status  03/29/2022 32.7 20.0 - 300.0 mg/dL Final   Creatinine, Ser  Date Value Ref Range Status  03/29/2022 0.87 0.57 - 1.00 mg/dL Final         Passed - K in normal range and within 180 days    Potassium  Date Value Ref Range Status  03/29/2022 3.7 3.5 - 5.2 mmol/L Final         Passed - Na in normal range and within 180 days    Sodium  Date Value Ref Range Status  03/29/2022 142 134 - 144 mmol/L Final         Passed - eGFR is 30 or above and within 180 days    GFR calc Af Amer  Date Value Ref Range Status  10/28/2020 88 >59 mL/min/1.73 Final    Comment:    **In accordance with recommendations from the NKF-ASN Task force,**   [facility] is in the process of updating its eGFR calculation to the   2021 CKD-EPI creatinine equation that estimates kidney function   without a race variable.    GFR calc non Af Amer  Date Value Ref Range Status  10/28/2020 77 >59 mL/min/1.73 Final   eGFR  Date Value Ref Range Status  03/29/2022 86 >59 mL/min/1.73 Final         Passed - Last BP in normal range    BP Readings from Last 1 Encounters:  03/29/22 119/83         Passed - Valid encounter within last 6 months    Recent Outpatient Visits          2 days ago Attention deficit hyperactivity disorder (ADHD), unspecified ADHD type   [facility] Mecum, Dani Gobble, PA-C   3 months ago Attention deficit hyperactivity disorder (ADHD), unspecified ADHD type   [facility] Jon Billings, NP   6 months ago Attention deficit hyperactivity disorder (ADHD), unspecified ADHD type    [facility] Jon Billings, NP   9 months ago Breast cancer screening by mammogram   [facility] Jon Billings, NP   1 year ago Attention deficit hyperactivity disorder (ADHD), unspecified ADHD type   [facility] Jon Billings, NP             . acyclovir (ZOVIRAX) 800 MG tablet [Pharmacy Med Name: ACYCLOVIR 800MG TABLETS] 180 tablet 0    Sig: TAKE 1 TABLET(800 MG) BY MOUTH TWICE DAILY     Antimicrobials:  Antiviral Agents - Anti-Herpetic Passed - 07/07/2022  6:23 AM      Passed - Valid encounter within last 12 months    Recent Outpatient Visits          2 days ago Attention deficit hyperactivity disorder (ADHD), unspecified ADHD type   [facility] Mecum, Erin E, PA-C   3 months ago Attention deficit hyperactivity disorder (ADHD), unspecified ADHD type   [place], NP   6 months ago Attention deficit hyperactivity disorder (ADHD), unspecified ADHD type   [place], NP   9 months  ago Breast cancer screening by mammogram   [place], NP   1 year ago Attention deficit hyperactivity disorder (ADHD), unspecified ADHD type   [facility] Jon Billings, NP

## 2022-07-20 ENCOUNTER — Other Ambulatory Visit: Payer: Self-pay | Admitting: Nurse Practitioner

## 2022-07-20 NOTE — Telephone Encounter (Signed)
Requested medication (s) are due for refill today: yes  Requested medication (s) are on the active medication list: historical med   Last refill:  07/05/22  Future visit scheduled: no  Notes to clinic:  historical provider   Requested Prescriptions  Pending Prescriptions Disp Refills   omeprazole (PRILOSEC) 20 MG capsule [Pharmacy Med Name: OMEPRAZOLE 20MG CAPSULES] 90 capsule     Sig: TAKE 1 CAPSULE(20 MG) BY MOUTH DAILY     Gastroenterology: Proton Pump Inhibitors Passed - 07/20/2022  6:30 AM      Passed - Valid encounter within last 12 months    Recent Outpatient Visits           2 weeks ago Attention deficit hyperactivity disorder (ADHD), unspecified ADHD type   [facility] Mecum, Erin E, PA-C   3 months ago Attention deficit hyperactivity disorder (ADHD), unspecified ADHD type   [place], NP   6 months ago Attention deficit hyperactivity disorder (ADHD), unspecified ADHD type   [place], NP   9 months ago Breast cancer screening by mammogram   [facility] Jon Billings, NP   1 year ago Attention deficit hyperactivity disorder (ADHD), unspecified ADHD type   [facility] Jon Billings, NP

## 2022-07-21 NOTE — Telephone Encounter (Signed)
Pt request refill on omeprazole filled last 04/28/22, last seen on 07/05/22 F/up not scheduled. Please advise.

## 2022-08-02 DIAGNOSIS — R200 Anesthesia of skin: Secondary | ICD-10-CM | POA: Diagnosis not present

## 2022-08-17 ENCOUNTER — Telehealth (INDEPENDENT_AMBULATORY_CARE_PROVIDER_SITE_OTHER): Payer: Medicaid Other | Admitting: Nurse Practitioner

## 2022-08-17 ENCOUNTER — Encounter: Payer: Self-pay | Admitting: Nurse Practitioner

## 2022-08-17 DIAGNOSIS — F909 Attention-deficit hyperactivity disorder, unspecified type: Secondary | ICD-10-CM

## 2022-08-17 MED ORDER — AMPHETAMINE-DEXTROAMPHETAMINE 20 MG PO TABS
20.0000 mg | ORAL_TABLET | Freq: Two times a day (BID) | ORAL | 0 refills | Status: DC
Start: 2022-09-16 — End: 2023-02-28

## 2022-08-17 MED ORDER — AMPHETAMINE-DEXTROAMPHETAMINE 20 MG PO TABS: 20.0000 mg | ORAL_TABLET | Freq: Two times a day (BID) | ORAL | 0 refills | Status: DC

## 2022-08-17 MED ORDER — DELZICOL 400 MG PO CPDR
DELAYED_RELEASE_CAPSULE | ORAL | 0 refills | Status: AC
Start: 2022-08-17 — End: ?

## 2022-08-17 NOTE — Progress Notes (Signed)
LVM asking patient to call back to schedule follow up visit

## 2022-08-17 NOTE — Progress Notes (Signed)
LMP  (LMP Unknown)    Subjective:    Patient ID: Tracy Whitaker, female    DOB: 1981/09/01, 41 y.o.   MRN: [id number]  HPI: Tracy Whitaker is a 41 y.o. female  Chief Complaint  Patient presents with   ADHD   ADHD FOLLOW UP Patient states she has no concerns.  Symptoms are well controlled on the medication. She feels like this is a good dose for her. She is due for refills today.  Up to date on controlled substance agreement and uds. ADHD status: controlled Satisfied with current therapy: yes Medication compliance:  excellent compliance Controlled substance contract: yes Previous psychiatry evaluation:  yes Previous medications: no adderall   Taking meds on weekends/vacations: yes Work/school performance:  excellent Difficulty sustaining attention/completing tasks: no Distracted by extraneous stimuli: no Does not listen when spoken to: yes  Fidgets with hands or feet: yes Unable to stay in seat: no Blurts out/interrupts others:  occassional ADHD Medication Side Effects: no    Decreased appetite: no    Headache: no    Sleeping disturbance pattern: no    Irritability: no    Rebound effects (worse than baseline) off medication: no    Anxiousness: no    Dizziness: no    Tics: no  Patient has been having urinary incontinence for about 6-8 months.  Patient has urinated on herself about 5 times.  Has not lost control of her bowel movements.  Denies pain with urination.  States she gets in the bathroom then the symptoms start.  States the urge to urinate hits her suddenly and not able to get to the bathroom quick enough. She is on Nortriptyline which has helped with her symptoms.   Relevant past medical, surgical, family and social history reviewed and updated as indicated. Interim medical history since our last visit reviewed. Allergies and medications reviewed and updated.  Review of Systems  Constitutional:  Negative for appetite change.  Genitourinary:        Urinary  incontinent.  Neurological:  Negative for dizziness.  Psychiatric/Behavioral:  Positive for decreased concentration. Negative for agitation and sleep disturbance. The patient is not nervous/anxious.     Per HPI unless specifically indicated above     Objective:    LMP  (LMP Unknown)   Wt Readings from Last 3 Encounters:  07/05/22 194 lb (88 kg)  03/29/22 188 lb 12.8 oz (85.6 kg)  12/28/21 182 lb (82.6 kg)    Physical Exam Vitals and nursing note reviewed.  Constitutional:      General: She is not in acute distress.    Appearance: Normal appearance. She is normal weight. She is not ill-appearing, toxic-appearing or diaphoretic.  HENT:     Head: Normocephalic.     Right Ear: External ear normal.     Left Ear: External ear normal.     Nose: Nose normal.     Mouth/Throat:     Mouth: Mucous membranes are moist.     Pharynx: Oropharynx is clear.  Eyes:     General:        Right eye: No discharge.        Left eye: No discharge.     Extraocular Movements: Extraocular movements intact.     Conjunctiva/sclera: Conjunctivae normal.     Pupils: Pupils are equal, round, and reactive to light.  Cardiovascular:     Rate and Rhythm: Normal rate and regular rhythm.     Heart sounds: No murmur heard. Pulmonary:  Effort: Pulmonary effort is normal. No respiratory distress.     Breath sounds: Normal breath sounds. No wheezing or rales.  Musculoskeletal:     Cervical back: Normal range of motion and neck supple.  Skin:    General: Skin is warm and dry.     Capillary Refill: Capillary refill takes less than 2 seconds.  Neurological:     General: No focal deficit present.     Mental Status: She is alert and oriented to person, place, and time. Mental status is at baseline.  Psychiatric:        Mood and Affect: Mood normal.        Behavior: Behavior normal.        Thought Content: Thought content normal.        Judgment: Judgment normal.     Results for orders placed or performed  in visit on 03/29/22  [id number] 11+Oxyco+Alc+Crt-Bund  Result Value Ref Range   Ethanol Negative Cutoff=0.020 %   Amphetamines, Urine See Final Results Cutoff=1000 ng/mL   Barbiturate Negative Cutoff=200 ng/mL   BENZODIAZ UR QL Negative Cutoff=200 ng/mL   Cannabinoid Quant, Ur See Final Results Cutoff=50 ng/mL   Cocaine (Metabolite) Negative Cutoff=300 ng/mL   OPIATE SCREEN URINE Negative Cutoff=300 ng/mL   Oxycodone/Oxymorphone, Urine Negative Cutoff=300 ng/mL   Phencyclidine Negative Cutoff=25 ng/mL   Methadone Screen, Urine Negative Cutoff=300 ng/mL   Propoxyphene Negative Cutoff=300 ng/mL   Meperidine Negative Cutoff=200 ng/mL   Tramadol See Final Results Cutoff=200 ng/mL   Creatinine 32.7 20.0 - 300.0 mg/dL   pH, Urine 6.6 4.5 - 8.9  Comp Met (CMET)  Result Value Ref Range   Glucose 78 70 - 99 mg/dL   BUN 6 6 - 24 mg/dL   Creatinine, Ser 0.87 0.57 - 1.00 mg/dL   eGFR 86 >59 mL/min/1.73   BUN/Creatinine Ratio 7 (L) 9 - 23   Sodium 142 134 - 144 mmol/L   Potassium 3.7 3.5 - 5.2 mmol/L   Chloride 108 (H) 96 - 106 mmol/L   CO2 20 20 - 29 mmol/L   Calcium 9.1 8.7 - 10.2 mg/dL   Total Protein 6.8 6.0 - 8.5 g/dL   Albumin 4.3 3.8 - 4.8 g/dL   Globulin, Total 2.5 1.5 - 4.5 g/dL   Albumin/Globulin Ratio 1.7 1.2 - 2.2   Bilirubin Total 0.3 0.0 - 1.2 mg/dL   Alkaline Phosphatase 66 44 - 121 IU/L   AST 13 0 - 40 IU/L   ALT 18 0 - 32 IU/L  Drug Profile [phone]  Result Value Ref Range   Amphetamines Positive (A) Cutoff=1000   Amphetamine Positive (A)    Amphetamine GC/MS Conf 2,061 Cutoff=500 ng/mL   Methamphetamine Negative Cutoff=500  Cannabinoid Conf, Ur  Result Value Ref Range   CANNABINOIDS Negative Cutoff=50  Tramadol Gc/Ms, Urine  Result Value Ref Range   Tramadol Positive (A) Cutoff=200   Tramadol gc/ms Conf 4,095 Cutoff=100 ng/mL      Assessment & Plan:   Problem List Items Addressed This Visit       Other   ADHD (attention deficit hyperactivity disorder)    Relevant Medications   amphetamine-dextroamphetamine (ADDERALL) 20 MG tablet     Follow up plan: Return in about 3 months (around 11/16/2022) for ADHD FU.  This visit was completed via MyChart due to the restrictions of the COVID-19 pandemic. All issues as above were discussed and addressed. Physical exam was done as above through visual confirmation on MyChart. If it was felt that the  patient should be evaluated in the office, they were directed there. The patient verbally consented to this visit. Location of the patient: Work Location of the provider: Office Those involved with this call:  Provider: Jon Billings, NP CMA: Valinda Hoar, [place] Desk/Registration: Lynnell Catalan This encounter was conducted via video.  I spent 20 to the care of this patient on the date of this encounter to include previsit review of medications, symptoms, follow up and plan of care dedicated 20, face to face time with the patient, and post visit ordering of testing.

## 2022-08-23 NOTE — Progress Notes (Signed)
2nd attempt to reach patient to schedule an appointment. Mychart messsage sent

## 2022-08-25 ENCOUNTER — Encounter: Payer: Self-pay | Admitting: Nurse Practitioner

## 2022-08-25 DIAGNOSIS — F909 Attention-deficit hyperactivity disorder, unspecified type: Secondary | ICD-10-CM

## 2022-08-25 MED ORDER — AMPHETAMINE-DEXTROAMPHETAMINE 20 MG PO TABS: 20.0000 mg | ORAL_TABLET | Freq: Two times a day (BID) | ORAL | 0 refills | Status: DC

## 2022-08-26 DIAGNOSIS — G8929 Other chronic pain: Secondary | ICD-10-CM | POA: Diagnosis not present

## 2022-08-26 DIAGNOSIS — M1712 Unilateral primary osteoarthritis, left knee: Secondary | ICD-10-CM | POA: Diagnosis not present

## 2022-08-26 DIAGNOSIS — M25862 Other specified joint disorders, left knee: Secondary | ICD-10-CM | POA: Diagnosis not present

## 2022-08-31 ENCOUNTER — Other Ambulatory Visit: Payer: Self-pay

## 2022-08-31 NOTE — Telephone Encounter (Signed)
Med refill request for Delzicol #180  Last filled 08/17/2022, last office visit as well. Upcoming OV on 09/23/2022. Please advise.

## 2022-09-05 ENCOUNTER — Other Ambulatory Visit: Payer: Self-pay | Admitting: Nurse Practitioner

## 2022-09-06 NOTE — Telephone Encounter (Signed)
Requested medication (s) are due for refill today - yes  Requested medication (s) are on the active medication list yes  Future visit scheduled no  Last refill: 06/21/22 #90 1RF  Notes to clinic: non delegated Rx  Requested Prescriptions  Pending Prescriptions Disp Refills   methocarbamol (ROBAXIN) 500 MG tablet [Pharmacy Med Name: METHOCARBAMOL 500MG TABLETS] 90 tablet 1    Sig: TAKE 1 TABLET BY MOUTH EVERY 8 HOURS AS NEEDED FOR MUSCLE SPASMS     Not Delegated - Analgesics:  Muscle Relaxants Failed - 09/05/2022 10:09 AM      Failed - This refill cannot be delegated      Passed - Valid encounter within last 6 months    Recent Outpatient Visits           2 weeks ago Attention deficit hyperactivity disorder (ADHD), unspecified ADHD type   [facility] Jon Billings, NP   2 months ago Attention deficit hyperactivity disorder (ADHD), unspecified ADHD type   [facility] Mecum, Erin E, PA-C   5 months ago Attention deficit hyperactivity disorder (ADHD), unspecified ADHD type   [facility] Jon Billings, NP   8 months ago Attention deficit hyperactivity disorder (ADHD), unspecified ADHD type   [facility] Jon Billings, NP   11 months ago Breast cancer screening by mammogram   [facility] Jon Billings, NP                 Requested Prescriptions  Pending Prescriptions Disp Refills   methocarbamol (ROBAXIN) 500 MG tablet [Pharmacy Med Name: METHOCARBAMOL 500MG TABLETS] 90 tablet 1    Sig: TAKE 1 TABLET BY MOUTH EVERY 8 HOURS AS NEEDED FOR MUSCLE SPASMS     Not Delegated - Analgesics:  Muscle Relaxants Failed - 09/05/2022 10:09 AM      Failed - This refill cannot be delegated      Passed - Valid encounter within last 6 months    Recent Outpatient Visits           2 weeks ago Attention deficit hyperactivity disorder (ADHD), unspecified ADHD type   [facility] Jon Billings,  NP   2 months ago Attention deficit hyperactivity disorder (ADHD), unspecified ADHD type   [facility] Mecum, Erin E, PA-C   5 months ago Attention deficit hyperactivity disorder (ADHD), unspecified ADHD type   [facility] Jon Billings, NP   8 months ago Attention deficit hyperactivity disorder (ADHD), unspecified ADHD type   [facility] Jon Billings, NP   11 months ago Breast cancer screening by mammogram   [facility] Jon Billings, NP

## 2022-09-08 ENCOUNTER — Encounter: Payer: Self-pay | Admitting: Nurse Practitioner

## 2022-09-10 ENCOUNTER — Encounter: Payer: Self-pay | Admitting: Nurse Practitioner

## 2022-09-16 ENCOUNTER — Encounter: Payer: Self-pay | Admitting: Physician Assistant

## 2022-09-16 ENCOUNTER — Telehealth (INDEPENDENT_AMBULATORY_CARE_PROVIDER_SITE_OTHER): Payer: Medicaid Other | Admitting: Physician Assistant

## 2022-09-16 ENCOUNTER — Telehealth: Payer: Medicaid Other | Admitting: Nurse Practitioner

## 2022-09-16 DIAGNOSIS — K51919 Ulcerative colitis, unspecified with unspecified complications: Secondary | ICD-10-CM | POA: Diagnosis not present

## 2022-09-16 NOTE — Assessment & Plan Note (Signed)
Chronic, ongoing condition Reports she is about to run out of her Delzicol which manages her symptoms PA started on this but needs visit to discuss Review of chart and HM reveals she is due for colonoscopy and has not met with GI since 2021  Will place referral for colonoscopy and to set her back up for monitoring She reports overall good control of symptoms by managing diet and using Delzicol as directed Continue regimen Follow up in 6 months for monitoring

## 2022-09-16 NOTE — Progress Notes (Signed)
Virtual Visit via Video Note  I connected with Tracy Whitaker on 09/16/22 at  1:00 PM EDT by a video enabled telemedicine application and verified that I am speaking with the correct person using two identifiers. Today's Provider: Talitha Givens, MHS, PA-C Introduced myself to the patient as a PA-C and provided education on APPs in clinical practice.   Location: Patient: at a friend's home [place], [place]  Provider: [place], [place]    I discussed the limitations of evaluation and management by telemedicine and the availability of in person appointments. The patient expressed understanding and agreed to proceed.   Chief Complaint  Patient presents with   Medication Refill    Patient would like refill for her ulcerative medication, delizcol.       History of Present Illness:   Was previously dx with ulcerative Colitis in 2013 at [facility] by Dr. Tracey Harries Her last GI apt appears to have been in 2021 She is not up to date on colonoscopy    She reports she needs a refill on her Delzicol for her UC  Reports she has had a flare lately because her Neurologist placed her on Meloxicam  She is still taking the Meloxicam - tried to stop taking it for about 2 weeks but pain was too severe and had to restart Reports she is having diarrhea and abdominal pain  Denies blood in her stool at this time  She is not followed by GI at this time and is due for repeat colonoscopy  She reports she has some medication left but is worried she will lose insurance in Nov so would like to get refill before that happens  Reports her UC is typically well controlled with Delzicol unless she has a trigger  She typically avoids trigger foods to help with management  Reports fried foods, red meat, red sauces, dairy  are typical flare triggers     Review of Systems  Constitutional:  Positive for diaphoresis. Negative for chills, fever and weight loss.  Gastrointestinal:  Positive for abdominal pain  and diarrhea. Negative for blood in stool, nausea and vomiting.      Observations/Objective:  Due to the nature of the virtual visit, physical exam and observations are limited. Able to obtain the following observations:   Alert, oriented, Appears comfortable, in no acute distress.  No scleral injection, no appreciated hoarseness, tachypnea, wheeze or strider. Able to maintain conversation without visible strain.  No cough appreciated during visit.    Assessment and Plan:  Problem List Items Addressed This Visit       Digestive   Ulcerative colitis ([place]) - Primary    Chronic, ongoing condition Reports she is about to run out of her Delzicol which manages her symptoms PA started on this but needs visit to discuss Review of chart and HM reveals she is due for colonoscopy and has not met with GI since 2021  Will place referral for colonoscopy and to set her back up for monitoring She reports overall good control of symptoms by managing diet and using Delzicol as directed Continue regimen Follow up in 6 months for monitoring       Relevant Orders   Ambulatory referral to Gastroenterology    Follow Up Instructions:    I discussed the assessment and treatment plan with the patient. The patient was provided an opportunity to ask questions and all were answered. The patient agreed with the plan and demonstrated an understanding of the instructions.  The patient was advised to call back or seek an in-person evaluation if the symptoms worsen or if the condition fails to improve as anticipated.  I provided 10 minutes of non-face-to-face time during this encounter.  No follow-ups on file.   I, Amadou Katzenstein E Onelia Cadmus, PA-C, have reviewed all documentation for this visit. The documentation on 09/16/22 for the exam, diagnosis, procedures, and orders are all accurate and complete.   Talitha Givens, MHS, PA-C [facility]

## 2022-09-20 ENCOUNTER — Telehealth: Payer: Self-pay

## 2022-09-20 NOTE — Telephone Encounter (Signed)
PA for Delzicol has been initiated via covermymeds. Awaiting determination   Key: [id number]

## 2022-09-23 DIAGNOSIS — R208 Other disturbances of skin sensation: Secondary | ICD-10-CM | POA: Diagnosis not present

## 2022-09-23 DIAGNOSIS — F413 Other mixed anxiety disorders: Secondary | ICD-10-CM | POA: Diagnosis not present

## 2022-09-23 DIAGNOSIS — R4189 Other symptoms and signs involving cognitive functions and awareness: Secondary | ICD-10-CM | POA: Diagnosis not present

## 2022-09-23 DIAGNOSIS — G479 Sleep disorder, unspecified: Secondary | ICD-10-CM | POA: Diagnosis not present

## 2022-09-23 DIAGNOSIS — R202 Paresthesia of skin: Secondary | ICD-10-CM | POA: Diagnosis not present

## 2022-09-23 DIAGNOSIS — R200 Anesthesia of skin: Secondary | ICD-10-CM | POA: Diagnosis not present

## 2022-09-23 DIAGNOSIS — M797 Fibromyalgia: Secondary | ICD-10-CM | POA: Diagnosis not present

## 2022-09-23 DIAGNOSIS — R531 Weakness: Secondary | ICD-10-CM | POA: Diagnosis not present

## 2022-11-17 ENCOUNTER — Encounter: Payer: Self-pay | Admitting: Nurse Practitioner

## 2022-11-18 ENCOUNTER — Ambulatory Visit: Payer: Self-pay | Admitting: Nurse Practitioner

## 2022-12-09 ENCOUNTER — Other Ambulatory Visit: Payer: Self-pay | Admitting: Nurse Practitioner

## 2022-12-09 NOTE — Telephone Encounter (Signed)
Requested medications are due for refill today.  yes  Requested medications are on the active medications list.  yes  Last refill. 10/27/2021 #180 1 rf  Future visit scheduled.   no  Notes to clinic.  Labs are expired.    Requested Prescriptions  Pending Prescriptions Disp Refills   metFORMIN (GLUCOPHAGE) 500 MG tablet [Pharmacy Med Name: METFORMIN '500MG'$  TABLETS] 180 tablet 1    Sig: TAKE 1 TABLET(500 MG) BY MOUTH TWICE DAILY WITH A MEAL     Endocrinology:  Diabetes - Biguanides Failed - 12/09/2022  6:24 AM      Failed - HBA1C is between 0 and 7.9 and within 180 days    HB A1C (BAYER DCA - WAIVED)  Date Value Ref Range Status  10/28/2020 5.4 <7.0 % Final    Comment:                                          Diabetic Adult            <7.0                                       Healthy Adult        4.3 - 5.7                                                           (DCCT/NGSP) American Diabetes Association's Summary of Glycemic Recommendations for Adults with Diabetes: Hemoglobin A1c <7.0%. More stringent glycemic goals (A1c <6.0%) may further reduce complications at the cost of increased risk of hypoglycemia.          Failed - B12 Level in normal range and within 720 days    Vitamin B-12  Date Value Ref Range Status  04/16/2016 357 211 - 946 pg/mL Final         Failed - CBC within normal limits and completed in the last 12 months    WBC  Date Value Ref Range Status  10/28/2020 8.4 3.4 - 10.8 x10E3/uL Final  08/23/2017 10.8 3.6 - 11.0 K/uL Final   RBC  Date Value Ref Range Status  10/28/2020 5.10 3.77 - 5.28 x10E6/uL Final  08/23/2017 4.58 3.80 - 5.20 MIL/uL Final   Hemoglobin  Date Value Ref Range Status  10/28/2020 16.4 (H) 11.1 - 15.9 g/dL Final   Hematocrit  Date Value Ref Range Status  10/28/2020 47.6 (H) 34.0 - 46.6 % Final   MCHC  Date Value Ref Range Status  10/28/2020 34.5 31.5 - 35.7 g/dL Final  08/23/2017 34.4 32.0 - 36.0 g/dL Final   [facility]  Date  Value Ref Range Status  10/28/2020 32.2 26.6 - 33.0 pg Final  08/23/2017 32.6 26.0 - 34.0 pg Final   MCV  Date Value Ref Range Status  10/28/2020 93 79 - 97 fL Final   No results found for: "PLTCOUNTKUC", "LABPLAT", "POCPLA" RDW  Date Value Ref Range Status  10/28/2020 12.1 11.7 - 15.4 % Final         Passed - Cr in normal range and within 360 days    Creatinine  Date Value  Ref Range Status  03/29/2022 32.7 20.0 - 300.0 mg/dL Final   Creatinine, Ser  Date Value Ref Range Status  03/29/2022 0.87 0.57 - 1.00 mg/dL Final         Passed - eGFR in normal range and within 360 days    GFR calc Af Amer  Date Value Ref Range Status  10/28/2020 88 >59 mL/min/1.73 Final    Comment:    **In accordance with recommendations from the NKF-ASN Task force,**   [facility] is in the process of updating its eGFR calculation to the   2021 CKD-EPI creatinine equation that estimates kidney function   without a race variable.    GFR calc non Af Amer  Date Value Ref Range Status  10/28/2020 77 >59 mL/min/1.73 Final   eGFR  Date Value Ref Range Status  03/29/2022 86 >59 mL/min/1.73 Final         Passed - Valid encounter within last 6 months    Recent Outpatient Visits           2 months ago Ulcerative colitis with complication, unspecified location [facility])   [place], Erin E, PA-C   3 months ago Attention deficit hyperactivity disorder (ADHD), unspecified ADHD type   [facility] Jon Billings, NP   5 months ago Attention deficit hyperactivity disorder (ADHD), unspecified ADHD type   [facility] Mecum, Erin E, PA-C   8 months ago Attention deficit hyperactivity disorder (ADHD), unspecified ADHD type   [place], NP   11 months ago Attention deficit hyperactivity disorder (ADHD), unspecified ADHD type   [facility] Jon Billings, NP

## 2023-02-28 ENCOUNTER — Encounter: Payer: Self-pay | Admitting: Nurse Practitioner

## 2023-02-28 ENCOUNTER — Ambulatory Visit: Payer: Medicaid Other | Admitting: Nurse Practitioner

## 2023-02-28 VITALS — BP 94/69 | HR 94 | Temp 98.1°F | Wt 213.6 lb

## 2023-02-28 DIAGNOSIS — F3132 Bipolar disorder, current episode depressed, moderate: Secondary | ICD-10-CM | POA: Diagnosis not present

## 2023-02-28 DIAGNOSIS — F909 Attention-deficit hyperactivity disorder, unspecified type: Secondary | ICD-10-CM

## 2023-02-28 DIAGNOSIS — K51919 Ulcerative colitis, unspecified with unspecified complications: Secondary | ICD-10-CM

## 2023-02-28 DIAGNOSIS — Z1231 Encounter for screening mammogram for malignant neoplasm of breast: Secondary | ICD-10-CM | POA: Diagnosis not present

## 2023-02-28 MED ORDER — AMPHETAMINE-DEXTROAMPHETAMINE 20 MG PO TABS: 20.0000 mg | ORAL_TABLET | Freq: Two times a day (BID) | ORAL | 0 refills | Status: DC

## 2023-02-28 MED ORDER — AMPHETAMINE-DEXTROAMPHETAMINE 20 MG PO TABS
20.0000 mg | ORAL_TABLET | Freq: Two times a day (BID) | ORAL | 0 refills | Status: DC
Start: 2023-03-30 — End: 2023-05-23

## 2023-02-28 MED ORDER — METHOCARBAMOL 500 MG PO TABS: 500.0000 mg | ORAL_TABLET | Freq: Three times a day (TID) | ORAL | 1 refills | Status: DC | PRN

## 2023-02-28 MED ORDER — SPIRONOLACTONE 100 MG PO TABS: ORAL_TABLET | ORAL | 1 refills | Status: DC

## 2023-02-28 NOTE — Patient Instructions (Signed)
Please call to schedule your mammogram and/or bone density: [facility]  Address: [address], [place], [place] [address] Phone: [phone]  [facility] [place] [address] [place],  [place]  [address] Phone: [phone]   

## 2023-02-28 NOTE — Assessment & Plan Note (Signed)
Chronic, ongoing condition Reports she is about to run out of her Delzicol which manages her symptoms Needs a referral to GI. Will discuss at physical in 3 months.

## 2023-02-28 NOTE — Assessment & Plan Note (Signed)
Chronic, appears well controlled on current medication She is taking Adderall 20 mg PO BID for symptoms and reports excellent control without concern for side effects at this time. Will send in three month supply.  Controlled substance agreement and UDS obtained during visit. Follow up in 3 months for monitoring

## 2023-02-28 NOTE — Assessment & Plan Note (Signed)
Chronic.  Controlled.  Continue with current medication regimen.  Followed by Dr. Malvin Johns at Neurology who manages medication. Labs ordered today.  Return to clinic in 3 months for reevaluation.  Call sooner if concerns arise.

## 2023-02-28 NOTE — Progress Notes (Signed)
BP 94/69   Pulse 94   Temp 98.1 F (36.7 C) (Oral)   Wt 213 lb 9.6 oz (96.9 kg)   SpO2 97%   BMI 36.56 kg/m    Subjective:    Patient ID: Tracy Whitaker, female    DOB: Sep 15, 1981, 42 y.o.   MRN: [id number]  HPI: Tracy Whitaker is a 42 y.o. female  Chief Complaint  Patient presents with   Anxiety   Depression   ADHD   Fibromyalgia   ADHD FOLLOW UP Patient states she has been breaking her adderall into pieces so she didn't come completely off.  She has lost her insurance which is why she hasn't been on it regularly.   ADHD status: controlled Satisfied with current therapy: yes Medication compliance:  excellent compliance Controlled substance contract: yes Previous psychiatry evaluation:  yes Previous medications: no adderall   Taking meds on weekends/vacations: yes Work/school performance:  excellent Difficulty sustaining attention/completing tasks: no Distracted by extraneous stimuli: no Does not listen when spoken to: yes  Fidgets with hands or feet: yes Unable to stay in seat: no Blurts out/interrupts others:  occassional ADHD Medication Side Effects: no    Decreased appetite: no    Headache: no    Sleeping disturbance pattern: no    Irritability: no    Rebound effects (worse than baseline) off medication: no    Anxiousness: no    Dizziness: no    Tics: no  Patient has been having urinary incontinence for about 6-8 months.  Patient has urinated on herself about 5 times.  Has not lost control of her bowel movements.  Denies pain with urination.  States she gets in the bathroom then the symptoms start.  States the urge to urinate hits her suddenly and not able to get to the bathroom quick enough. She is on Nortriptyline which has helped with her symptoms.   Relevant past medical, surgical, family and social history reviewed and updated as indicated. Interim medical history since our last visit reviewed. Allergies and medications reviewed and  updated.  Review of Systems  Constitutional:  Negative for appetite change.  Genitourinary:        Urinary incontinent.  Neurological:  Negative for dizziness.  Psychiatric/Behavioral:  Positive for decreased concentration. Negative for agitation and sleep disturbance. The patient is not nervous/anxious.     Per HPI unless specifically indicated above     Objective:    BP 94/69   Pulse 94   Temp 98.1 F (36.7 C) (Oral)   Wt 213 lb 9.6 oz (96.9 kg)   SpO2 97%   BMI 36.56 kg/m   Wt Readings from Last 3 Encounters:  02/28/23 213 lb 9.6 oz (96.9 kg)  07/05/22 194 lb (88 kg)  03/29/22 188 lb 12.8 oz (85.6 kg)    Physical Exam Vitals and nursing note reviewed.  Constitutional:      General: She is not in acute distress.    Appearance: Normal appearance. She is normal weight. She is not ill-appearing, toxic-appearing or diaphoretic.  HENT:     Head: Normocephalic.     Right Ear: External ear normal.     Left Ear: External ear normal.     Nose: Nose normal.     Mouth/Throat:     Mouth: Mucous membranes are moist.     Pharynx: Oropharynx is clear.  Eyes:     General:        Right eye: No discharge.  Left eye: No discharge.     Extraocular Movements: Extraocular movements intact.     Conjunctiva/sclera: Conjunctivae normal.     Pupils: Pupils are equal, round, and reactive to light.  Cardiovascular:     Rate and Rhythm: Normal rate and regular rhythm.     Heart sounds: No murmur heard. Pulmonary:     Effort: Pulmonary effort is normal. No respiratory distress.     Breath sounds: Normal breath sounds. No wheezing or rales.  Musculoskeletal:     Cervical back: Normal range of motion and neck supple.  Skin:    General: Skin is warm and dry.     Capillary Refill: Capillary refill takes less than 2 seconds.  Neurological:     General: No focal deficit present.     Mental Status: She is alert and oriented to person, place, and time. Mental status is at baseline.   Psychiatric:        Mood and Affect: Mood normal.        Behavior: Behavior normal.        Thought Content: Thought content normal.        Judgment: Judgment normal.     Results for orders placed or performed in visit on 03/29/22  [id number] 11+Oxyco+Alc+Crt-Bund  Result Value Ref Range   Ethanol Negative Cutoff=0.020 %   Amphetamines, Urine See Final Results Cutoff=1000 ng/mL   Barbiturate Negative Cutoff=200 ng/mL   BENZODIAZ UR QL Negative Cutoff=200 ng/mL   Cannabinoid Quant, Ur See Final Results Cutoff=50 ng/mL   Cocaine (Metabolite) Negative Cutoff=300 ng/mL   OPIATE SCREEN URINE Negative Cutoff=300 ng/mL   Oxycodone/Oxymorphone, Urine Negative Cutoff=300 ng/mL   Phencyclidine Negative Cutoff=25 ng/mL   Methadone Screen, Urine Negative Cutoff=300 ng/mL   Propoxyphene Negative Cutoff=300 ng/mL   Meperidine Negative Cutoff=200 ng/mL   Tramadol See Final Results Cutoff=200 ng/mL   Creatinine 32.7 20.0 - 300.0 mg/dL   pH, Urine 6.6 4.5 - 8.9  Comp Met (CMET)  Result Value Ref Range   Glucose 78 70 - 99 mg/dL   BUN 6 6 - 24 mg/dL   Creatinine, Ser 7.65 0.57 - 1.00 mg/dL   eGFR 86 >46 TK/PTW/6.56   BUN/Creatinine Ratio 7 (L) 9 - 23   Sodium 142 134 - 144 mmol/L   Potassium 3.7 3.5 - 5.2 mmol/L   Chloride 108 (H) 96 - 106 mmol/L   CO2 20 20 - 29 mmol/L   Calcium 9.1 8.7 - 10.2 mg/dL   Total Protein 6.8 6.0 - 8.5 g/dL   Albumin 4.3 3.8 - 4.8 g/dL   Globulin, Total 2.5 1.5 - 4.5 g/dL   Albumin/Globulin Ratio 1.7 1.2 - 2.2   Bilirubin Total 0.3 0.0 - 1.2 mg/dL   Alkaline Phosphatase 66 44 - 121 IU/L   AST 13 0 - 40 IU/L   ALT 18 0 - 32 IU/L  Drug Profile [phone]  Result Value Ref Range   Amphetamines Positive (A) Cutoff=1000   Amphetamine Positive (A)    Amphetamine GC/MS Conf 2,061 Cutoff=500 ng/mL   Methamphetamine Negative Cutoff=500  Cannabinoid Conf, Ur  Result Value Ref Range   CANNABINOIDS Negative Cutoff=50  Tramadol Gc/Ms, Urine  Result Value Ref Range    Tramadol Positive (A) Cutoff=200   Tramadol gc/ms Conf 4,095 Cutoff=100 ng/mL      Assessment & Plan:   Problem List Items Addressed This Visit       Digestive   Ulcerative colitis    Chronic, ongoing condition Reports she is about  to run out of her Delzicol which manages her symptoms Needs a referral to GI. Will discuss at physical in 3 months.        Other   ADHD (attention deficit hyperactivity disorder) - Primary    Chronic, appears well controlled on current medication She is taking Adderall 20 mg PO BID for symptoms and reports excellent control without concern for side effects at this time. Will send in three month supply.  Controlled substance agreement and UDS obtained during visit. Follow up in 3 months for monitoring       Relevant Medications   amphetamine-dextroamphetamine (ADDERALL) 20 MG tablet (Start on 03/30/2023)   Other Relevant Orders   Comp Met (CMET)   [id number] 11+Oxyco+Alc+Crt-Bund   Bipolar 1 disorder, depressed, moderate    Chronic.  Controlled.  Continue with current medication regimen.  Followed by Dr. Malvin Johns at Neurology who manages medication. Labs ordered today.  Return to clinic in 3 months for reevaluation.  Call sooner if concerns arise.        Other Visit Diagnoses     Encounter for screening mammogram for malignant neoplasm of breast       Relevant Orders   MM 3D SCREENING MAMMOGRAM BILATERAL BREAST        Follow up plan: Return in about 3 months (around 05/30/2023) for Physical and Fasting labs, ADHD FU.

## 2023-03-01 LAB — COMPREHENSIVE METABOLIC PANEL
ALT: 14 IU/L (ref 0–32)
AST: 12 IU/L (ref 0–40)
Albumin/Globulin Ratio: 1.7 (ref 1.2–2.2)
Albumin: 4.2 g/dL (ref 3.9–4.9)
Alkaline Phosphatase: 70 IU/L (ref 44–121)
BUN/Creatinine Ratio: 11 (ref 9–23)
BUN: 10 mg/dL (ref 6–24)
Bilirubin Total: 0.2 mg/dL (ref 0.0–1.2)
CO2: 22 mmol/L (ref 20–29)
Calcium: 9.2 mg/dL (ref 8.7–10.2)
Chloride: 109 mmol/L — ABNORMAL HIGH (ref 96–106)
Creatinine, Ser: 0.9 mg/dL (ref 0.57–1.00)
Globulin, Total: 2.5 g/dL (ref 1.5–4.5)
Glucose: 79 mg/dL (ref 70–99)
Potassium: 4.5 mmol/L (ref 3.5–5.2)
Sodium: 143 mmol/L (ref 134–144)
Total Protein: 6.7 g/dL (ref 6.0–8.5)
eGFR: 82 mL/min/{1.73_m2} (ref >59)

## 2023-03-01 LAB — DRUG SCREEN 764883 11+OXYCO+ALC+CRT-BUND
Amphetamines, Urine: NEGATIVE ng/mL
BENZODIAZ UR QL: NEGATIVE ng/mL
Barbiturate: NEGATIVE ng/mL
Cannabinoid Quant, Ur: NEGATIVE ng/mL
Cocaine (Metabolite): NEGATIVE ng/mL
Creatinine: 22 mg/dL (ref 20.0–300.0)
Ethanol: NEGATIVE %
Meperidine: NEGATIVE ng/mL
Methadone Screen, Urine: NEGATIVE ng/mL
OPIATE SCREEN URINE: NEGATIVE ng/mL
Oxycodone/Oxymorphone, Urine: NEGATIVE ng/mL
Phencyclidine: NEGATIVE ng/mL
Propoxyphene: NEGATIVE ng/mL
Tramadol: NEGATIVE ng/mL
pH, Urine: 6.3 (ref 4.5–8.9)

## 2023-03-01 NOTE — Progress Notes (Signed)
Hi Tracy Whitaker. It was nice to see you yesterday.  Your lab work looks good.  No concerns at this time. Continue with your current medication regimen.  Follow up as discussed.  Please let me know if you have any questions.

## 2023-03-03 DIAGNOSIS — M25562 Pain in left knee: Secondary | ICD-10-CM | POA: Diagnosis not present

## 2023-03-03 DIAGNOSIS — R202 Paresthesia of skin: Secondary | ICD-10-CM | POA: Diagnosis not present

## 2023-03-03 DIAGNOSIS — R531 Weakness: Secondary | ICD-10-CM | POA: Diagnosis not present

## 2023-03-03 DIAGNOSIS — F413 Other mixed anxiety disorders: Secondary | ICD-10-CM | POA: Diagnosis not present

## 2023-03-03 DIAGNOSIS — R200 Anesthesia of skin: Secondary | ICD-10-CM | POA: Diagnosis not present

## 2023-03-03 DIAGNOSIS — R208 Other disturbances of skin sensation: Secondary | ICD-10-CM | POA: Diagnosis not present

## 2023-03-03 DIAGNOSIS — M797 Fibromyalgia: Secondary | ICD-10-CM | POA: Diagnosis not present

## 2023-03-03 DIAGNOSIS — G479 Sleep disorder, unspecified: Secondary | ICD-10-CM | POA: Diagnosis not present

## 2023-03-03 DIAGNOSIS — R251 Tremor, unspecified: Secondary | ICD-10-CM | POA: Diagnosis not present

## 2023-03-03 DIAGNOSIS — M25522 Pain in left elbow: Secondary | ICD-10-CM | POA: Diagnosis not present

## 2023-03-03 DIAGNOSIS — G8929 Other chronic pain: Secondary | ICD-10-CM | POA: Diagnosis not present

## 2023-03-03 DIAGNOSIS — R29898 Other symptoms and signs involving the musculoskeletal system: Secondary | ICD-10-CM | POA: Diagnosis not present

## 2023-03-08 ENCOUNTER — Telehealth: Payer: Self-pay | Admitting: Nurse Practitioner

## 2023-03-08 NOTE — Telephone Encounter (Signed)
Called and left message  to let patient know that ir is ok for a virtual appt for follow up

## 2023-03-08 NOTE — Telephone Encounter (Signed)
Virtual visit okay.

## 2023-03-08 NOTE — Telephone Encounter (Unsigned)
Copied from CRM [id number]. Topic: Appointment Scheduling - Scheduling Inquiry for Clinic >> Mar 08, 2023 10:40 AM Franchot Heidelberg wrote: Reason for CRM: Pt called requesting to schedule a 3 month follow up per PCP, wants to know if PCP prefers to see her in office or via mychart video. Please advise, pt states her availability is Mondays and Thursdays. 3 months would be around 05/30/2023  Best contact:  [phone]

## 2023-03-09 ENCOUNTER — Other Ambulatory Visit: Payer: Self-pay | Admitting: Nurse Practitioner

## 2023-03-10 NOTE — Telephone Encounter (Signed)
Requested Prescriptions  Pending Prescriptions Disp Refills   omeprazole (PRILOSEC) 20 MG capsule [Pharmacy Med Name: OMEPRAZOLE  CAPSULES] 90 capsule 3    Sig: TAKE 1 CAPSULE(20 MG) BY MOUTH DAILY     Gastroenterology: Proton Pump Inhibitors Passed - 03/09/2023  6:19 AM      Passed - Valid encounter within last 12 months    Recent Outpatient Visits           1 week ago Attention deficit hyperactivity disorder (ADHD), unspecified ADHD type   [place] [facility] Larae Grooms, NP   5 months ago Ulcerative colitis with complication, unspecified location [facility])   [place] [facility] Mecum, Erin E, PA-C   6 months ago Attention deficit hyperactivity disorder (ADHD), unspecified ADHD type   [place] [facility] Larae Grooms, NP   8 months ago Attention deficit hyperactivity disorder (ADHD), unspecified ADHD type   [place] [facility] Mecum, Oswaldo Conroy, PA-C   11 months ago Attention deficit hyperactivity disorder (ADHD), unspecified ADHD type   [place] [facility] Larae Grooms, NP       Future Appointments             In 2 months Larae Grooms, NP [facility], [facility]

## 2023-03-31 ENCOUNTER — Ambulatory Visit
Admission: RE | Admit: 2023-03-31 | Discharge: 2023-03-31 | Disposition: A | Discharge status: Home / Self Care | Payer: Medicaid Other | Source: Ambulatory Visit | Attending: Nurse Practitioner | Admitting: Nurse Practitioner

## 2023-03-31 DIAGNOSIS — Z1231 Encounter for screening mammogram for malignant neoplasm of breast: Secondary | ICD-10-CM | POA: Insufficient documentation

## 2023-04-01 NOTE — Progress Notes (Signed)
Please let patient know her Mammogram did not show any evidence of a malignancy.  The recommendation is to repeat the Mammogram in 1 year.  

## 2023-04-27 ENCOUNTER — Other Ambulatory Visit: Payer: Self-pay | Admitting: Nurse Practitioner

## 2023-04-27 NOTE — Telephone Encounter (Signed)
Called and LVM asking for patient to please return my call.  

## 2023-04-27 NOTE — Telephone Encounter (Signed)
Requested medication (s) are due for refill today:   Yes  Requested medication (s) are on the active medication list:   Yes  Future visit scheduled:   Yes 05/23/2023   Last ordered: 04/09/2022 #84, 3 43 refills  Returned because got a high warning alert from pharmacy that the Topamax can decrease hormonal contraceptives.   Also her physical is coming up in July.   Clydie Braun to review for refill prior to appt.      Requested Prescriptions  Pending Prescriptions Disp Refills   CRYSELLE-28 0.3-30 MG-MCG tablet [Pharmacy Med Name: CRYSELLE TABLETS 28S] 84 tablet 3    Sig: TAKE 1 TABLET BY MOUTH EVERY DAY     OB/GYN:  Contraceptives Passed - 04/27/2023  9:25 AM      Passed - Last BP in normal range    BP Readings from Last 1 Encounters:  02/28/23 94/69         Passed - Valid encounter within last 12 months    Recent Outpatient Visits           1 month ago Attention deficit hyperactivity disorder (ADHD), unspecified ADHD type   [place] [facility] Larae Grooms, NP   7 months ago Ulcerative colitis with complication, unspecified location [facility])   [place] [facility] Mecum, Erin E, PA-C   8 months ago Attention deficit hyperactivity disorder (ADHD), unspecified ADHD type   [place] [facility] Larae Grooms, NP   9 months ago Attention deficit hyperactivity disorder (ADHD), unspecified ADHD type   [place] [facility] Mecum, Oswaldo Conroy, PA-C   1 year ago Attention deficit hyperactivity disorder (ADHD), unspecified ADHD type   [place] [facility] Larae Grooms, NP       Future Appointments             In 3 weeks Larae Grooms, NP [place] [facility], [facility]            Passed - Patient is not a smoker

## 2023-04-28 ENCOUNTER — Telehealth: Payer: Self-pay | Admitting: Nurse Practitioner

## 2023-04-28 NOTE — Telephone Encounter (Signed)
Called and LVM asking for patient to please return my call.  

## 2023-04-28 NOTE — Telephone Encounter (Signed)
Copied from CRM [id number]. Topic: General - Other >> Apr 28, 2023  9:45 AM Carrielelia G wrote: Patient only has two weeks of BC meds left

## 2023-04-28 NOTE — Telephone Encounter (Signed)
Patient returned call, see phone encounter. Patient states she only has 2 weeks worth of medication left.

## 2023-05-23 ENCOUNTER — Encounter: Payer: Self-pay | Admitting: Nurse Practitioner

## 2023-05-23 ENCOUNTER — Telehealth (INDEPENDENT_AMBULATORY_CARE_PROVIDER_SITE_OTHER): Payer: Medicaid Other | Admitting: Nurse Practitioner

## 2023-05-23 DIAGNOSIS — F909 Attention-deficit hyperactivity disorder, unspecified type: Secondary | ICD-10-CM

## 2023-05-23 MED ORDER — AMPHETAMINE-DEXTROAMPHETAMINE 20 MG PO TABS: 20.0000 mg | ORAL_TABLET | Freq: Two times a day (BID) | ORAL | 0 refills | Status: DC

## 2023-05-23 NOTE — Assessment & Plan Note (Signed)
Chronic, appears well controlled on current medication She is taking Adderall 20 mg PO BID for symptoms and reports excellent control without concern for side effects at this time. Will send in three month supply.  Controlled substance agreement and UDS up to date. Follow up in 3 months for monitoring.  Due for physical at that time also.

## 2023-05-23 NOTE — Progress Notes (Signed)
Wt 210 lb (95.3 kg)   BMI 35.94 kg/m    Subjective:    Patient ID: Tracy Whitaker, female    DOB: [birth year], 42 y.o.   MRN: [id number]  HPI: Tracy Whitaker is a 42 y.o. female  Chief Complaint  Patient presents with   ADHD   Ulcerative Colitis   Depression   ADHD FOLLOW UP Patient states she has been taking her medication as prescribed.  Doing much better now that she is back on the medication.   ADHD status: controlled Satisfied with current therapy: yes Medication compliance:  excellent compliance Controlled substance contract: yes Previous psychiatry evaluation:  yes Previous medications: no adderall   Taking meds on weekends/vacations: yes Work/school performance:  excellent Difficulty sustaining attention/completing tasks: no Distracted by extraneous stimuli: no Does not listen when spoken to: yes  Fidgets with hands or feet: yes Unable to stay in seat: no Blurts out/interrupts others:  occassional ADHD Medication Side Effects: no    Decreased appetite: no    Headache: no    Sleeping disturbance pattern: no    Irritability: no    Rebound effects (worse than baseline) off medication: no    Anxiousness: no    Dizziness: no    Tics: no    Relevant past medical, surgical, family and social history reviewed and updated as indicated. Interim medical history since our last visit reviewed. Allergies and medications reviewed and updated.  Review of Systems  Constitutional:  Negative for appetite change.  Genitourinary:        Urinary incontinent.  Neurological:  Negative for dizziness.  Psychiatric/Behavioral:  Positive for decreased concentration. Negative for agitation and sleep disturbance. The patient is not nervous/anxious.     Per HPI unless specifically indicated above     Objective:    Wt 210 lb (95.3 kg)   BMI 35.94 kg/m   Wt Readings from Last 3 Encounters:  05/23/23 210 lb (95.3 kg)  02/28/23 213 lb 9.6 oz (96.9 kg)  07/05/22 194 lb  (88 kg)    Physical Exam Vitals and nursing note reviewed.  Constitutional:      General: She is not in acute distress.    Appearance: Normal appearance. She is normal weight. She is not ill-appearing, toxic-appearing or diaphoretic.  HENT:     Head: Normocephalic.     Right Ear: External ear normal.     Left Ear: External ear normal.     Nose: Nose normal.     Mouth/Throat:     Mouth: Mucous membranes are moist.     Pharynx: Oropharynx is clear.  Eyes:     General:        Right eye: No discharge.        Left eye: No discharge.     Extraocular Movements: Extraocular movements intact.     Conjunctiva/sclera: Conjunctivae normal.     Pupils: Pupils are equal, round, and reactive to light.  Cardiovascular:     Rate and Rhythm: Normal rate and regular rhythm.     Heart sounds: No murmur heard. Pulmonary:     Effort: Pulmonary effort is normal. No respiratory distress.     Breath sounds: Normal breath sounds. No wheezing or rales.  Musculoskeletal:     Cervical back: Normal range of motion and neck supple.  Skin:    General: Skin is warm and dry.     Capillary Refill: Capillary refill takes less than 2 seconds.  Neurological:     General: No  focal deficit present.     Mental Status: She is alert and oriented to person, place, and time. Mental status is at baseline.  Psychiatric:        Mood and Affect: Mood normal.        Behavior: Behavior normal.        Thought Content: Thought content normal.        Judgment: Judgment normal.     Results for orders placed or performed in visit on 02/28/23  Comp Met (CMET)  Result Value Ref Range   Glucose 79 70 - 99 mg/dL   BUN 10 6 - 24 mg/dL   Creatinine, Ser 8.29 0.57 - 1.00 mg/dL   eGFR 82 >56 OZ/HYQ/6.57   BUN/Creatinine Ratio 11 9 - 23   Sodium 143 134 - 144 mmol/L   Potassium 4.5 3.5 - 5.2 mmol/L   Chloride 109 (H) 96 - 106 mmol/L   CO2 22 20 - 29 mmol/L   Calcium 9.2 8.7 - 10.2 mg/dL   Total Protein 6.7 6.0 - 8.5 g/dL    Albumin 4.2 3.9 - 4.9 g/dL   Globulin, Total 2.5 1.5 - 4.5 g/dL   Albumin/Globulin Ratio 1.7 1.2 - 2.2   Bilirubin Total 0.2 0.0 - 1.2 mg/dL   Alkaline Phosphatase 70 44 - 121 IU/L   AST 12 0 - 40 IU/L   ALT 14 0 - 32 IU/L  [id number] 11+Oxyco+Alc+Crt-Bund  Result Value Ref Range   Ethanol Negative Cutoff=0.020 %   Amphetamines, Urine Negative Cutoff=1000 ng/mL   Barbiturate Negative Cutoff=200 ng/mL   BENZODIAZ UR QL Negative Cutoff=200 ng/mL   Cannabinoid Quant, Ur Negative Cutoff=50 ng/mL   Cocaine (Metabolite) Negative Cutoff=300 ng/mL   OPIATE SCREEN URINE Negative Cutoff=300 ng/mL   Oxycodone/Oxymorphone, Urine Negative Cutoff=300 ng/mL   Phencyclidine Negative Cutoff=25 ng/mL   Methadone Screen, Urine Negative Cutoff=300 ng/mL   Propoxyphene Negative Cutoff=300 ng/mL   Meperidine Negative Cutoff=200 ng/mL   Tramadol Negative Cutoff=200 ng/mL   Creatinine 22.0 20.0 - 300.0 mg/dL   pH, Urine 6.3 4.5 - 8.9      Assessment & Plan:   Problem List Items Addressed This Visit       Other   ADHD (attention deficit hyperactivity disorder)    Chronic, appears well controlled on current medication She is taking Adderall 20 mg PO BID for symptoms and reports excellent control without concern for side effects at this time. Will send in three month supply.  Controlled substance agreement and UDS up to date. Follow up in 3 months for monitoring.  Due for physical at that time also.         Follow up plan: Return in about 3 months (around 08/23/2023) for Physical and Fasting labs.  This visit was completed via MyChart due to the restrictions of the COVID-19 pandemic. All issues as above were discussed and addressed. Physical exam was done as above through visual confirmation on MyChart. If it was felt that the patient should be evaluated in the office, they were directed there. The patient verbally consented to this visit. Location of the patient: Home Location of the provider:  Office Those involved with this call:  Provider: Larae Grooms, NP CMA: Wilhemena Durie, CMA Front Desk/Registration: Servando Snare This encounter was conducted via video.  I spent 20 dedicated to the care of this patient on the date of this encounter to include previsit review of symptoms, plan of care and follow up, face to face time with the patient, and  post visit ordering of testing.

## 2023-06-09 DIAGNOSIS — R5382 Chronic fatigue, unspecified: Secondary | ICD-10-CM | POA: Diagnosis not present

## 2023-06-09 DIAGNOSIS — M79672 Pain in left foot: Secondary | ICD-10-CM | POA: Diagnosis not present

## 2023-06-09 DIAGNOSIS — M19072 Primary osteoarthritis, left ankle and foot: Secondary | ICD-10-CM | POA: Diagnosis not present

## 2023-06-09 DIAGNOSIS — M25572 Pain in left ankle and joints of left foot: Secondary | ICD-10-CM | POA: Diagnosis not present

## 2023-06-09 DIAGNOSIS — M791 Myalgia, unspecified site: Secondary | ICD-10-CM | POA: Diagnosis not present

## 2023-06-09 DIAGNOSIS — M7732 Calcaneal spur, left foot: Secondary | ICD-10-CM | POA: Diagnosis not present

## 2023-06-09 DIAGNOSIS — M130 Polyarthritis, unspecified: Secondary | ICD-10-CM | POA: Diagnosis not present

## 2023-06-09 DIAGNOSIS — M2550 Pain in unspecified joint: Secondary | ICD-10-CM | POA: Diagnosis not present

## 2023-06-09 DIAGNOSIS — M797 Fibromyalgia: Secondary | ICD-10-CM | POA: Diagnosis not present

## 2023-06-09 DIAGNOSIS — K51919 Ulcerative colitis, unspecified with unspecified complications: Secondary | ICD-10-CM | POA: Diagnosis not present

## 2023-06-13 ENCOUNTER — Encounter: Payer: Self-pay | Admitting: Nurse Practitioner

## 2023-06-13 DIAGNOSIS — F909 Attention-deficit hyperactivity disorder, unspecified type: Secondary | ICD-10-CM

## 2023-06-14 ENCOUNTER — Other Ambulatory Visit: Payer: Self-pay

## 2023-06-14 DIAGNOSIS — F909 Attention-deficit hyperactivity disorder, unspecified type: Secondary | ICD-10-CM

## 2023-06-14 MED ORDER — AMPHETAMINE-DEXTROAMPHETAMINE 20 MG PO TABS
20.0000 mg | ORAL_TABLET | Freq: Two times a day (BID) | ORAL | 0 refills | Status: AC
Start: 2023-08-15 — End: 2023-09-14

## 2023-06-14 MED ORDER — AMPHETAMINE-DEXTROAMPHETAMINE 20 MG PO TABS: 20.0000 mg | ORAL_TABLET | Freq: Two times a day (BID) | ORAL | 0 refills | Status: DC

## 2023-06-14 NOTE — Telephone Encounter (Signed)
Attempted to reach patient, LVM to call office back to get scheduled for her physical.  Put in CRM.

## 2023-06-14 NOTE — Telephone Encounter (Signed)
Pt called in to [facility] after leaving the message, scheduled her physical for 08/25/2023 @ 3 pm.

## 2023-07-04 ENCOUNTER — Other Ambulatory Visit: Payer: Self-pay | Admitting: Nurse Practitioner

## 2023-07-06 ENCOUNTER — Encounter: Payer: Self-pay | Admitting: Nurse Practitioner

## 2023-07-06 NOTE — Telephone Encounter (Signed)
Requested medication (s) are due for refill today: routing for approval  Requested medication (s) are on the active medication list: yes  Last refill:  02/28/23  Future visit scheduled: yes  Notes to clinic:  Unable to refill per protocol, cannot delegate.         Requested Prescriptions  Pending Prescriptions Disp Refills   methocarbamol (ROBAXIN) 500 MG tablet [Pharmacy Med Name: METHOCARBAMOL 500MG  TABLETS] 90 tablet 1    Sig: TAKE 1 TABLET(500 MG) BY MOUTH EVERY 8 HOURS AS NEEDED FOR MUSCLE SPASMS     Not Delegated - Analgesics:  Muscle Relaxants Failed - 07/04/2023  6:36 PM      Failed - This refill cannot be delegated      Passed - Valid encounter within last 6 months    Recent Outpatient Visits           1 month ago Attention deficit hyperactivity disorder (ADHD), unspecified ADHD type   [place] [facility] Larae Grooms, NP   4 months ago Attention deficit hyperactivity disorder (ADHD), unspecified ADHD type   [place] [facility] Larae Grooms, NP   9 months ago Ulcerative colitis with complication, unspecified location [facility])   [place] [facility] Mecum, Erin E, PA-C   10 months ago Attention deficit hyperactivity disorder (ADHD), unspecified ADHD type   [place] [facility] Larae Grooms, NP   1 year ago Attention deficit hyperactivity disorder (ADHD), unspecified ADHD type   [place] [facility] Mecum, Oswaldo Conroy, PA-C       Future Appointments             In 1 month Larae Grooms, NP [place] [facility], [facility]

## 2023-07-08 MED ORDER — METHOCARBAMOL 500 MG PO TABS: 500.0000 mg | ORAL_TABLET | Freq: Three times a day (TID) | ORAL | 0 refills | Status: DC | PRN

## 2023-07-12 DIAGNOSIS — R4189 Other symptoms and signs involving cognitive functions and awareness: Secondary | ICD-10-CM | POA: Diagnosis not present

## 2023-07-12 DIAGNOSIS — R531 Weakness: Secondary | ICD-10-CM | POA: Diagnosis not present

## 2023-07-12 DIAGNOSIS — M2550 Pain in unspecified joint: Secondary | ICD-10-CM | POA: Diagnosis not present

## 2023-07-12 DIAGNOSIS — M797 Fibromyalgia: Secondary | ICD-10-CM | POA: Diagnosis not present

## 2023-07-12 DIAGNOSIS — M25522 Pain in left elbow: Secondary | ICD-10-CM | POA: Diagnosis not present

## 2023-07-12 DIAGNOSIS — F413 Other mixed anxiety disorders: Secondary | ICD-10-CM | POA: Diagnosis not present

## 2023-07-12 DIAGNOSIS — M25562 Pain in left knee: Secondary | ICD-10-CM | POA: Diagnosis not present

## 2023-07-12 DIAGNOSIS — R200 Anesthesia of skin: Secondary | ICD-10-CM | POA: Diagnosis not present

## 2023-07-12 DIAGNOSIS — R29898 Other symptoms and signs involving the musculoskeletal system: Secondary | ICD-10-CM | POA: Diagnosis not present

## 2023-07-12 DIAGNOSIS — G479 Sleep disorder, unspecified: Secondary | ICD-10-CM | POA: Diagnosis not present

## 2023-07-12 DIAGNOSIS — R251 Tremor, unspecified: Secondary | ICD-10-CM | POA: Diagnosis not present

## 2023-07-12 DIAGNOSIS — R208 Other disturbances of skin sensation: Secondary | ICD-10-CM | POA: Diagnosis not present

## 2023-07-21 DIAGNOSIS — K51919 Ulcerative colitis, unspecified with unspecified complications: Secondary | ICD-10-CM | POA: Diagnosis not present

## 2023-07-21 DIAGNOSIS — M19079 Primary osteoarthritis, unspecified ankle and foot: Secondary | ICD-10-CM | POA: Diagnosis not present

## 2023-07-21 DIAGNOSIS — M797 Fibromyalgia: Secondary | ICD-10-CM | POA: Diagnosis not present

## 2023-07-22 ENCOUNTER — Other Ambulatory Visit: Payer: Self-pay | Admitting: Nurse Practitioner

## 2023-07-22 NOTE — Telephone Encounter (Signed)
Requested Prescriptions  Pending Prescriptions Disp Refills   CRYSELLE-28 0.3-30 MG-MCG tablet [Pharmacy Med Name: CRYSELLE TABLETS 28S] 84 tablet 0    Sig: TAKE 1 TABLET BY MOUTH EVERY DAY     OB/GYN:  Contraceptives Passed - 07/22/2023 11:20 AM      Passed - Last BP in normal range    BP Readings from Last 1 Encounters:  02/28/23 94/69         Passed - Valid encounter within last 12 months    Recent Outpatient Visits           2 months ago Attention deficit hyperactivity disorder (ADHD), unspecified ADHD type   [place] [facility] Larae Grooms, NP   4 months ago Attention deficit hyperactivity disorder (ADHD), unspecified ADHD type   [place] [facility] Larae Grooms, NP   10 months ago Ulcerative colitis with complication, unspecified location [facility])   [place] [facility] Mecum, Erin E, PA-C   11 months ago Attention deficit hyperactivity disorder (ADHD), unspecified ADHD type   [place] [facility] Larae Grooms, NP   1 year ago Attention deficit hyperactivity disorder (ADHD), unspecified ADHD type   [place] [facility] Mecum, Oswaldo Conroy, PA-C       Future Appointments             In 1 month Evelene Croon, Atilano Median, MD  [facility], [facility]            Passed - Patient is not a smoker

## 2023-08-25 ENCOUNTER — Ambulatory Visit (INDEPENDENT_AMBULATORY_CARE_PROVIDER_SITE_OTHER): Payer: Medicaid Other | Admitting: Pediatrics

## 2023-08-25 ENCOUNTER — Encounter: Payer: Self-pay | Admitting: Pediatrics

## 2023-08-25 ENCOUNTER — Other Ambulatory Visit (HOSPITAL_COMMUNITY)
Admission: RE | Admit: 2023-08-25 | Discharge: 2023-08-25 | Disposition: A | Discharge status: Home / Self Care | Payer: Medicaid Other | Source: Ambulatory Visit | Attending: Nurse Practitioner | Admitting: Nurse Practitioner

## 2023-08-25 VITALS — BP 103/70 | HR 105 | Temp 98.3°F | Ht 65.0 in | Wt 234.0 lb

## 2023-08-25 DIAGNOSIS — Z1211 Encounter for screening for malignant neoplasm of colon: Secondary | ICD-10-CM

## 2023-08-25 DIAGNOSIS — E669 Obesity, unspecified: Secondary | ICD-10-CM

## 2023-08-25 DIAGNOSIS — F17200 Nicotine dependence, unspecified, uncomplicated: Secondary | ICD-10-CM

## 2023-08-25 DIAGNOSIS — Z5941 Food insecurity: Secondary | ICD-10-CM

## 2023-08-25 DIAGNOSIS — F17210 Nicotine dependence, cigarettes, uncomplicated: Secondary | ICD-10-CM | POA: Diagnosis not present

## 2023-08-25 DIAGNOSIS — Z23 Encounter for immunization: Secondary | ICD-10-CM | POA: Diagnosis not present

## 2023-08-25 DIAGNOSIS — Z124 Encounter for screening for malignant neoplasm of cervix: Secondary | ICD-10-CM

## 2023-08-25 DIAGNOSIS — Z0000 Encounter for general adult medical examination without abnormal findings: Secondary | ICD-10-CM | POA: Diagnosis not present

## 2023-08-25 DIAGNOSIS — Z716 Tobacco abuse counseling: Secondary | ICD-10-CM | POA: Diagnosis not present

## 2023-08-25 DIAGNOSIS — Z1330 Encounter for screening examination for mental health and behavioral disorders, unspecified: Secondary | ICD-10-CM

## 2023-08-25 DIAGNOSIS — F909 Attention-deficit hyperactivity disorder, unspecified type: Secondary | ICD-10-CM | POA: Diagnosis not present

## 2023-08-25 DIAGNOSIS — Z5986 Financial insecurity: Secondary | ICD-10-CM

## 2023-08-25 MED ORDER — TOPIRAMATE 25 MG PO TABS
ORAL_TABLET | ORAL | 1 refills | Status: DC
Start: 2023-08-25 — End: 2023-10-04

## 2023-08-25 MED ORDER — NICOTINE POLACRILEX 2 MG MT LOZG: LOZENGE | OROMUCOSAL | 0 refills | Status: DC

## 2023-08-25 MED ORDER — AMPHETAMINE-DEXTROAMPHETAMINE 30 MG PO TABS
30.0000 mg | ORAL_TABLET | Freq: Two times a day (BID) | ORAL | 0 refills | Status: DC
Start: 2023-08-25 — End: 2023-09-11

## 2023-08-25 MED ORDER — NICOTINE 21 MG/24HR TD PT24: MEDICATED_PATCH | TRANSDERMAL | 0 refills | Status: DC

## 2023-08-25 NOTE — Progress Notes (Signed)
BP 103/70   Pulse (!) 105   Temp 98.3 F (36.8 C) (Oral)   Ht 5\' 5"  (1.651 m)   Wt 234 lb (106.1 kg)   LMP 07/31/2023 (Approximate)   BMI 38.94 kg/m    Annual Physical Exam - Female  Subjective:   CC: Annual Exam  Tracy Whitaker is a 42 y.o. female patient here for a preventative health maintenance exam. Additional topics discussed include:  #ADHD Follow up ADHD: patient is currently treated with Adderall 20 mg. She believes that the medication is not very effective. She takes this everyday. Hoping to either increase dose or switch to another medication. Reports the following side effects: none.  #FIBROMYALGIA Fibromyalgia being managed by neurology at [facility] She is still having a lot of symptoms, limiting movement. Just started lyrica, feels pain better but having brain fog.  Has had some instabliity with balance but is at baseline  #SMOKING  Has quit smoking she is now vaping primarily, believes vaping more than before Previously 0.5pack-1pack a day She is motivated to quit, interested in resources  Health Habits: DIET: in general, an "unhealthy" diet, concerned about night time snacking and eating. Wondering if qualifies for any medications to help w weight management. EXERCISE: none.  DENTAL EXAM: Up to Date EYE EXAM: Up to Date                       Relevant Gynecologic History LMP: Patient's last menstrual period was 07/31/2023 (approximate).  Menstrual Status: premenopausal, Flow regular every month without intermenstrual spotting PAP History:  Result Date Procedure Results Follow-ups Next Due  08/25/2023 Cytology - PAP     04/22/2017 Cytology - PAP CYTOLOGY - PAP: PAP RESULT    04/16/2016 Cytology - PAP CYTOLOGY - PAP: PAP RESULT    12/28/2013 HM PAP SMEAR HM Pap smear: negative, HPV-      History abnormal PAP: when younger Sexual activity: not at this time Family history breast, ovarian cancer: Yes, mom had breast cancer Domestic Violence Screen. Feels safe  at home: Yes  family history includes Breast cancer in an other family member; Breast cancer (age of onset: 10) in her mother; Cancer in her maternal grandmother; Heart disease in her paternal aunt; Kidney Stones in her father.  Social History   Tobacco Use   Smoking status: Former    Current packs/day: 0.00    Average packs/day: 0.5 packs/day for 20.0 years (10.0 ttl pk-yrs)    Types: Cigarettes    Start date: 01/16/2001    Quit date: 01/16/2021    Years since quitting: 2.6   Smokeless tobacco: Never  Vaping Use   Vaping status: Some Days  Substance Use Topics   Alcohol use: Yes    Comment: "once every couple of weeks"   Drug use: No   Social History   Social History Narrative   Not on file    Social drivers questionnaire is reviewed and is positive for : Depression  , Tobacco Use, and Physical Activity. Follow up: see plan.  Depression Screening:     08/25/2023    3:07 PM 05/23/2023   10:39 AM 02/28/2023    2:54 PM 07/05/2022    2:47 PM 03/29/2022    2:06 PM  Depression screen PHQ 2/9  Decreased Interest 0 0 0 0 0  Down, Depressed, Hopeless 0 0 1 0 0  PHQ - 2 Score 0 0 1 0 0  Altered sleeping 2 1 3 2  1  Tired, decreased energy 2 3 1 2  0  Change in appetite 2 3 3 2  0  Feeling bad or failure about yourself  1 0 2 0 0  Trouble concentrating 3 0 2 0 0  Moving slowly or fidgety/restless 0 0 0 0 0  Suicidal thoughts 0 0 0 0 0  PHQ-9 Score 10 7 12 6 1   Difficult doing work/chores Somewhat difficult Not difficult at all Somewhat difficult Somewhat difficult Somewhat difficult       08/25/2023    3:07 PM 05/23/2023   10:40 AM 02/28/2023    2:54 PM 07/05/2022    2:48 PM  GAD 7 : Generalized Anxiety Score  Nervous, Anxious, on Edge 0 0 2 2  Control/stop worrying 2 0 2 2  Worry too much - different things 2 0 2 2  Trouble relaxing 2 0 2 2  Restless 0 0 2 2  Easily annoyed or irritable 0 0 2 0  Afraid - awful might happen 0 0 2 2  Total GAD 7 Score 6 0 14 12  Anxiety  Difficulty Somewhat difficult Not difficult at all Somewhat difficult Somewhat difficult    Plan:  currently on pharmacologic tx  Self Management Goals  Goals   None    Health Maintenance Colon Cancer Screening : Had colonoscopy in 2021 as part of colitis w/u. She had previously been dx w UC. Had 2 pre-cancerous polyps removed, recommended for 2-3 year follow up. This is overdue.  Mammogram : Up to Date DXA scan : Not applicable Immunizations : up to date and documented  Review of Systems See HPI for relevant ROS.  Outpatient Medications Prior to Visit  Medication Sig Dispense Refill   acyclovir (ZOVIRAX) 800 MG tablet TAKE 1 TABLET(800 MG) BY MOUTH TWICE DAILY 180 tablet 0   acyclovir ointment (ZOVIRAX) 5 % Apply 1 application topically every 3 (three) hours. 30 g 3   ARIPiprazole (ABILIFY) 10 MG tablet Take 10 mg twice a day     clonazePAM (KLONOPIN) 0.5 MG tablet Take 0.5 mg by mouth 3 (three) times daily as needed.     CRYSELLE-28 0.3-30 MG-MCG tablet TAKE 1 TABLET BY MOUTH EVERY DAY 84 tablet 0   DELZICOL 400 MG CPDR DR capsule TAKE 2 CAPSULES(800 MG) BY MOUTH TWICE DAILY 180 capsule 0   divalproex (DEPAKOTE) 250 MG DR tablet Take 250 mg by mouth 2 (two) times daily.     metFORMIN (GLUCOPHAGE) 500 MG tablet TAKE 1 TABLET(500 MG) BY MOUTH TWICE DAILY WITH A MEAL 180 tablet 1   methocarbamol (ROBAXIN) 500 MG tablet Take 1 tablet (500 mg total) by mouth every 8 (eight) hours as needed for muscle spasms. 90 tablet 0   nortriptyline (PAMELOR) 50 MG capsule Take 50 mg by mouth at bedtime.     omeprazole (PRILOSEC) 20 MG capsule TAKE 1 CAPSULE(20 MG) BY MOUTH DAILY 90 capsule 3   ondansetron (ZOFRAN-ODT) 4 MG disintegrating tablet DISSOLVE 1 TABLET(4 MG) ON THE TONGUE EVERY 8 HOURS AS NEEDED 40 tablet 0   pregabalin (LYRICA) 25 MG capsule Take 50 mg by mouth in the morning and at bedtime.     spironolactone (ALDACTONE) 100 MG tablet TAKE 1 TABLET(100 MG) BY MOUTH DAILY 90 tablet 1    venlafaxine (EFFEXOR) 75 MG tablet Take 75 mg by mouth 2 (two) times daily. At night     amphetamine-dextroamphetamine (ADDERALL) 20 MG tablet Take 1 tablet (20 mg total) by mouth 2 (two) times daily. 60  tablet 0   topiramate (TOPAMAX) 25 MG tablet Take 25 mg by mouth 2 (two) times daily.     amphetamine-dextroamphetamine (ADDERALL) 20 MG tablet Take 1 tablet (20 mg total) by mouth 2 (two) times daily. 60 tablet 0   amphetamine-dextroamphetamine (ADDERALL) 20 MG tablet Take 1 tablet (20 mg total) by mouth 2 (two) times daily. 60 tablet 0   gabapentin (NEURONTIN) 600 MG tablet 600 mg in the AM, 600 mg at lunch time, and 1200 MG at bedtime (Patient not taking: Reported on 08/25/2023)     No facility-administered medications prior to visit.     Patient Active Problem List   Diagnosis Date Noted   Bipolar 1 disorder, depressed, moderate (HCC) 02/28/2023   Controlled substance agreement signed 03/17/2021   Abnormal menses 10/28/2020   Vitamin D deficiency 10/28/2020   Fibromyalgia 08/28/2020   Recurrent boils 08/22/2020   Degenerative disc disease, cervical    Pain in joints of both feet 12/18/2019   Fatigue 12/18/2019   Anxiety disorder 05/04/2019   Cigarette smoker 04/19/2019   Depression 05/12/2018   Controlled substance agreement broken 01/30/2018   HSV infection 01/27/2018   Insomnia 09/18/2017   Ulcerative colitis (HCC) 09/15/2017   Polycystic disease, ovaries 09/15/2017   Myalgia 08/31/2017   Polyarthralgia 08/31/2017   Pulmonary nodules 08/25/2017   Hydronephrosis, left 11/06/2016   ADHD (attention deficit hyperactivity disorder) 04/16/2016   Obesity (BMI 30-39.9) 04/16/2016   BV (bacterial vaginosis) 03/04/2016    Objective:   Vitals:   08/25/23 1458  BP: 103/70  Pulse: (!) 105  Temp: 98.3 F (36.8 C)  Height: 5\' 5"  (1.651 m)  Weight: 234 lb (106.1 kg)  TempSrc: Oral  BMI (Calculated): 38.94    Body mass index is 38.94 kg/m.  Physical Exam Exam conducted with  a chaperone present.  Constitutional:      Appearance: Normal appearance.  HENT:     Head: Normocephalic and atraumatic.  Eyes:     Pupils: Pupils are equal, round, and reactive to light.  Cardiovascular:     Rate and Rhythm: Normal rate and regular rhythm.     Pulses: Normal pulses.     Heart sounds: Normal heart sounds.  Pulmonary:     Effort: Pulmonary effort is normal.     Breath sounds: Normal breath sounds.  Abdominal:     General: Abdomen is flat.     Palpations: Abdomen is soft.  Genitourinary:    Exam position: Lithotomy position.     Pubic Area: No rash.      Labia:        Right: No rash, tenderness or lesion.        Left: No rash, tenderness or lesion.      Vagina: Normal.     Cervix: Normal.     Adnexa: Right adnexa normal.  Musculoskeletal:        General: Normal range of motion.     Cervical back: Normal range of motion.  Skin:    General: Skin is warm and dry.     Capillary Refill: Capillary refill takes less than 2 seconds.  Neurological:     General: No focal deficit present.     Mental Status: She is alert. Mental status is at baseline.  Psychiatric:        Mood and Affect: Mood normal.        Behavior: Behavior normal.    Assessment and Plan:   Attention deficit hyperactivity disorder (ADHD), unspecified ADHD type Assessment &  Plan: Longstanding hx. Unclear why has continued taking medication daily. We discussed going up on dose and low chance will have much benefit vs switching to other agent such as vyvanse or concerta. Encouraged patient to discontinue taking daily. Will re-evaluate at follow up.  Orders: -     Amphetamine-Dextroamphetamine; Take 1 tablet by mouth 2 (two) times daily.  Dispense: 60 tablet; Refill: 0  Obesity (BMI 30-39.9) Assessment & Plan: Gave patient overview of lifestyle changes, limited by chronic pain. Discussed pharmacologic treatment. Plan to have her increase topiramate dose in effort to curtail her night time  snacking. Suspect will also help with fibromyalgia symptoms.  Orders: -     Topiramate; Take 1 (25mg ) tab in the morning and take 2 tabs (50mg ) at night  Dispense: 60 tablet; Refill: 1  Annual physical exam Discussed lifestyle modifications and goals including plant based eating styles (such as: Mediterranean eating style), regular exercise (at least 150 min of moderate-intensity aerobic exercise per week), get adequate sleep, and continue working with PCP towards meeting health goals to ensure healthy aging.  -     CBC -     Comprehensive metabolic panel -     Hemoglobin A1c -     Lipid panel -     TSH  Encounter for behavioral health screening As part of their intake evaluation, the patient was screened for depression, anxiety.  PHQ9 SCORE 10, GAD7 SCORE 6. Screening results positive for tested conditions. Meets criteria for Anxiety depression, mild or not persistent based on screening for and signs and symptoms discussion. She has current treatment w effexor 75mg  BID, declines medication adjustments today. See plan under problem/diagnosis above.  Screening for cervical cancer -     Cytology - PAP  Screen for colon cancer Overdue, will f/u w GI.  Need for vaccination -     Flu vaccine trivalent PF, 6mos and older(Flulaval,Afluria,Fluarix,Fluzone) -     Advertising account planner Vaccine 61yrs & older  Financial insecurity -     AMB Referral to Managed Medicaid Care Management  Food insecurity -     AMB Referral to Managed Medicaid Care Management  Smoker Cigarette nicotine dependence without complication Encounter for smoking cessation counseling Discussed cessation and available treatment including nicotine replacement options, pharmacologic treatment, and/or online resources. Based on our discussion, she does want to initiate treatment today. Plans to Nicotine Patch and lozenges.  -     Nicotine; RX #1 Weeks 1-4: 21 mg x 1 patch daily. Wear for 24 hours. If you have sleep  disturbances, remove at bedtime.  Dispense: 28 patch; Refill: 0 -     Nicotine Polacrilex; RX #2 Weeks 7-9: 1 lozenge every 2-4 hours.Max 20 lozenges per day.  Dispense: 252 lozenge; Refill: 0  This plan was discussed with the patient and questions were answered. There were no further concerns.  Follow up as indicated, or sooner should any new problems arise, if conditions worsen, or if they are otherwise concerned.   See patient instructions for additional information.  Tracy Vanyo Howell Pringle, MD  Family Medicine      Future Appointments  Date Time Provider Department Center  09/01/2023  9:00 AM Shaune Leeks [facility] None  09/08/2023  3:40 PM Jackolyn Confer, MD [facility]

## 2023-08-25 NOTE — Patient Instructions (Addendum)
Smoking cessation plan: - 21mg  patch for 4 weeks, then 14mg  for 4 weeks, then 7mg  for 4 weeks - use nicotine lozenges as needed  Plan to revisit ADHD treatment via video in two weeks  Increase topiramate dose to 1 tab 25mg  in morning and 2 tabs 50mg  to help with night time cravings

## 2023-08-26 ENCOUNTER — Other Ambulatory Visit: Payer: Self-pay | Admitting: Physician Assistant

## 2023-08-26 LAB — CBC
Hematocrit: 45.7 % (ref 34.0–46.6)
Hemoglobin: 15.1 g/dL (ref 11.1–15.9)
MCH: 31.4 pg (ref 26.6–33.0)
MCHC: 33 g/dL (ref 31.5–35.7)
MCV: 95 fL (ref 79–97)
Platelets: 293 10*3/uL (ref 150–450)
RBC: 4.81 x10E6/uL (ref 3.77–5.28)
RDW: 12 % (ref 11.7–15.4)
WBC: 9.5 10*3/uL (ref 3.4–10.8)

## 2023-08-26 LAB — COMPREHENSIVE METABOLIC PANEL
ALT: 17 [IU]/L (ref 0–32)
AST: 18 [IU]/L (ref 0–40)
Albumin: 4.4 g/dL (ref 3.9–4.9)
Alkaline Phosphatase: 60 [IU]/L (ref 44–121)
BUN/Creatinine Ratio: 8 — ABNORMAL LOW (ref 9–23)
BUN: 7 mg/dL (ref 6–24)
Bilirubin Total: 0.3 mg/dL (ref 0.0–1.2)
CO2: 25 mmol/L (ref 20–29)
Calcium: 9.3 mg/dL (ref 8.7–10.2)
Chloride: 101 mmol/L (ref 96–106)
Creatinine, Ser: 0.91 mg/dL (ref 0.57–1.00)
Globulin, Total: 2.2 g/dL (ref 1.5–4.5)
Glucose: 74 mg/dL (ref 70–99)
Potassium: 4.1 mmol/L (ref 3.5–5.2)
Sodium: 141 mmol/L (ref 134–144)
Total Protein: 6.6 g/dL (ref 6.0–8.5)
eGFR: 81 mL/min/{1.73_m2} (ref >59)

## 2023-08-26 LAB — HEMOGLOBIN A1C
Est. average glucose Bld gHb Est-mCnc: 97 mg/dL
Hgb A1c MFr Bld: 5 % (ref 4.8–5.6)

## 2023-08-26 LAB — LIPID PANEL
Chol/HDL Ratio: 3.6 {ratio} (ref 0.0–4.4)
Cholesterol, Total: 200 mg/dL — ABNORMAL HIGH (ref 100–199)
HDL: 55 mg/dL (ref >39)
LDL Chol Calc (NIH): 120 mg/dL — ABNORMAL HIGH (ref 0–99)
Triglycerides: 143 mg/dL (ref 0–149)
VLDL Cholesterol Cal: 25 mg/dL (ref 5–40)

## 2023-08-26 LAB — TSH: TSH: 2.02 u[IU]/mL (ref 0.450–4.500)

## 2023-08-26 NOTE — Telephone Encounter (Signed)
Requested medication (s) are due for refill today:   Provider to review  Requested medication (s) are on the active medication list:   Yes  Future visit scheduled:   Yes with Dr. Evelene Croon    Last ordered: 07/08/2023 #90, 0 refills  Non delegated refill    Requested Prescriptions  Pending Prescriptions Disp Refills   methocarbamol (ROBAXIN) 500 MG tablet [Pharmacy Med Name: METHOCARBAMOL 500MG  TABLETS] 90 tablet 0    Sig: TAKE 1 TABLET(500 MG) BY MOUTH EVERY 8 HOURS AS NEEDED FOR MUSCLE SPASMS     Not Delegated - Analgesics:  Muscle Relaxants Failed - 08/26/2023  9:44 AM      Failed - This refill cannot be delegated      Passed - Valid encounter within last 6 months    Recent Outpatient Visits           Yesterday Annual physical exam   [place] [facility] Jackolyn Confer, MD   3 months ago Attention deficit hyperactivity disorder (ADHD), unspecified ADHD type   [place] [facility] Larae Grooms, NP   5 months ago Attention deficit hyperactivity disorder (ADHD), unspecified ADHD type   [place] [facility] Larae Grooms, NP   11 months ago Ulcerative colitis with complication, unspecified location [facility])   [place] [facility] Mecum, Oswaldo Conroy, PA-C   1 year ago Attention deficit hyperactivity disorder (ADHD), unspecified ADHD type   [place] [facility] Larae Grooms, NP       Future Appointments             In 1 week Evelene Croon, Atilano Median, MD [facility], [facility]

## 2023-08-28 ENCOUNTER — Encounter: Payer: Self-pay | Admitting: Pediatrics

## 2023-08-28 NOTE — Assessment & Plan Note (Signed)
Longstanding hx. Unclear why has continued taking medication daily. We discussed going up on dose and low chance will have much benefit vs switching to other agent such as vyvanse or concerta. Encouraged patient to discontinue taking daily. Will re-evaluate at follow up.

## 2023-08-28 NOTE — Assessment & Plan Note (Addendum)
Gave patient overview of lifestyle changes, limited by chronic pain. Discussed pharmacologic treatment. Plan to have her increase topiramate dose in effort to curtail her night time snacking. Suspect will also help with fibromyalgia symptoms.

## 2023-08-29 ENCOUNTER — Other Ambulatory Visit: Payer: Self-pay | Admitting: Pediatrics

## 2023-08-29 DIAGNOSIS — Z1211 Encounter for screening for malignant neoplasm of colon: Secondary | ICD-10-CM

## 2023-08-31 LAB — CYTOLOGY - PAP: Diagnosis: NEGATIVE

## 2023-09-01 ENCOUNTER — Other Ambulatory Visit: Payer: Medicaid Other

## 2023-09-01 NOTE — Patient Instructions (Signed)
Visit Information  Ms. Tracy Whitaker was given information about Medicaid Managed Care team care coordination services as a part of their [facility] Medicaid benefit. Tracy Whitaker verbally consented to engagement with the [facility] Managed Care team.   If you are experiencing a medical emergency, please call 911 or report to your local emergency department or urgent care.   If you have a non-emergency medical problem during routine business hours, please contact your provider's office and ask to speak with a nurse.   For questions related to your [facility] health plan, please call: [phone] or visit the homepage here: [email or web address]  If you would like to schedule transportation through your Healthy [facility] plan, please call the following number at least 2 days in advance of your appointment: [phone]  For information about your ride after you set it up, call Ride Assist at [phone]. Use this number to activate a Will Call pickup, or if your transportation is late for a scheduled pickup. Use this number, too, if you need to make a change or cancel a previously scheduled reservation.  If you need transportation services right away, call [phone]. The after-hours call center is staffed 24 hours to handle ride assistance and urgent reservation requests (including discharges) 365 days a year. Urgent trips include sick visits, hospital discharge requests and life-sustaining treatment.  Call the [facility] Line at [phone], at any time, 24 hours a day, 7 days a week. If you are in danger or need immediate medical attention call 911.  If you would like help to quit smoking, call 1-800-QUIT-NOW (380-073-5609) OR Espaol: 1-855-Djelo-Ya (5-188-416-6063) o para ms informacin haga clic aqu or Text READY to 016-010 to register via text  Ms. Virrueta - following are the goals we discussed in your visit today:    Goals Addressed   None     Social Worker will follow up on 10/04/23.   Gus Puma, Kenard Gower, MHA [facility]  Managed Medicaid Social Worker [phone]   Following is a copy of your plan of care:  There are no care plans that you recently modified to display for this patient.

## 2023-09-01 NOTE — Patient Outreach (Signed)
Medicaid Managed Care Social Work Note  09/01/2023 Name:  Tracy Whitaker MRN:  [id number] DOB:  12-06-1980  Tracy Whitaker is an 42 y.o. year old female who is a primary patient of Tracy Whitaker, Tracy Median, MD.  The Medicaid Managed Care Coordination team was consulted for assistance with:  Food Insecurity Community Resources   Tracy Whitaker was given information about Medicaid Managed Care Coordination team services today. Tracy Whitaker Patient agreed to services and verbal consent obtained.  Engaged with patient  for by telephone forinitial visit in response to referral for case management and/or care coordination services.   Assessments/Interventions:  Review of past medical history, allergies, medications, health status, including review of consultants reports, laboratory and other test data, was performed as part of comprehensive evaluation and provision of chronic care management services.  SDOH: (Social Determinant of Health) assessments and interventions performed: SDOH Interventions    Flowsheet Row Video Visit from 05/23/2023 in [facility] Office Visit from 02/28/2023 in [facility] Video Visit from 07/05/2022 in [facility] Office Visit from 03/29/2022 in [facility] Office Visit from 02/25/2020 in [facility] Office Visit from 08/31/2019 in [facility]  SDOH Interventions        Depression Interventions/Treatment  Medication, Currently on Treatment Medication, Currently on Treatment Medication, Currently on Treatment Medication, Currently on Treatment Currently on Treatment Currently on Treatment     BSW completed a telephone outreach with patient, she states she only works 3 days a week and does receive 585 in foodstamps each month. Patient states she has 1 child in the home and the foodstamps do not last the whole month. Patient states she  has applied for disability and is waiting for a decision. BSW and patient agreed for food, rent and utilities resources to be mailed to patient.  Advanced Directives Status:  Not addressed in this encounter.  Care Plan                 Allergies  Allergen Reactions   Latex Dermatitis   Sulfamethoxazole-Trimethoprim Other (See Comments)    Joint pain    Medications Reviewed Today   Medications were not reviewed in this encounter     Patient Active Problem List   Diagnosis Date Noted   Bipolar 1 disorder, depressed, moderate (HCC) 02/28/2023   Controlled substance agreement signed 03/17/2021   Abnormal menses 10/28/2020   Vitamin D deficiency 10/28/2020   Fibromyalgia 08/28/2020   Recurrent boils 08/22/2020   Degenerative disc disease, cervical    Pain in joints of both feet 12/18/2019   Fatigue 12/18/2019   Anxiety disorder 05/04/2019   Cigarette smoker 04/19/2019   Depression 05/12/2018   Controlled substance agreement broken 01/30/2018   HSV infection 01/27/2018   Insomnia 09/18/2017   Ulcerative colitis (HCC) 09/15/2017   Polycystic disease, ovaries 09/15/2017   Myalgia 08/31/2017   Polyarthralgia 08/31/2017   Pulmonary nodules 08/25/2017   Hydronephrosis, left 11/06/2016   ADHD (attention deficit hyperactivity disorder) 04/16/2016   Obesity (BMI 30-39.9) 04/16/2016   BV (bacterial vaginosis) 03/04/2016    Conditions to be addressed/monitored per PCP order:   food and community resources  There are no care plans that you recently modified to display for this patient.   Follow up:  Patient agrees to Care Plan and Follow-up.  Plan: The Managed Medicaid care management team will reach out to the patient again over the  next 30 days.  Date/time of next scheduled Social Work care management/care coordination outreach:  10/04/23  Tracy Whitaker, Tracy Whitaker, [facility]  Managed [facility] Social Worker [phone]

## 2023-09-08 ENCOUNTER — Encounter: Payer: Self-pay | Admitting: Pediatrics

## 2023-09-08 ENCOUNTER — Telehealth (INDEPENDENT_AMBULATORY_CARE_PROVIDER_SITE_OTHER): Payer: Medicaid Other | Admitting: Pediatrics

## 2023-09-08 DIAGNOSIS — F909 Attention-deficit hyperactivity disorder, unspecified type: Secondary | ICD-10-CM

## 2023-09-08 DIAGNOSIS — Z716 Tobacco abuse counseling: Secondary | ICD-10-CM

## 2023-09-08 DIAGNOSIS — E669 Obesity, unspecified: Secondary | ICD-10-CM | POA: Diagnosis not present

## 2023-09-08 DIAGNOSIS — F17210 Nicotine dependence, cigarettes, uncomplicated: Secondary | ICD-10-CM

## 2023-09-08 NOTE — Progress Notes (Signed)
Telehealth Visit  I connected with  Tracy Whitaker on 09/11/23 by a video enabled telemedicine application and verified that I am speaking with the correct person using two identifiers.   I discussed the limitations of evaluation and management by telemedicine. The patient expressed understanding and agreed to proceed.  Subjective:    Patient ID: Tracy Whitaker, female    DOB: [birth year], 42 y.o.   MRN: [id number]  HPI: Tracy Whitaker is a 42 y.o. female  Chief Complaint  Patient presents with   ADHD   #ADHD Last visit increase adderrall dose  She has not noted any improvement on attention on higher adderrall dose  #weight management Increase dose of nortriptyline for night time snacking Has not noticed any difference No side effects  #cigarettes Started nictoine patches, has been going well, occasional vaping but stopped cigarettes Not using lozenges, not covered by insurance  Relevant past medical, surgical, family and social history reviewed and updated as indicated. Interim medical history since our last visit reviewed. Allergies and medications reviewed and updated.  ROS per HPI unless specifically indicated above     Objective:    LMP 08/31/2023 (Approximate)   Wt Readings from Last 3 Encounters:  08/25/23 234 lb (106.1 kg)  05/23/23 210 lb (95.3 kg)  02/28/23 213 lb 9.6 oz (96.9 kg)     Physical Exam Constitutional:      General: She is not in acute distress.    Appearance: Normal appearance.  Neurological:     General: No focal deficit present.     Mental Status: She is alert. Mental status is at baseline.     LIMITED EXAM GIVEN VIDEO VISIT     Assessment & Plan:  Assessment & Plan   Attention deficit hyperactivity disorder (ADHD), unspecified ADHD type Assessment & Plan: Plan to switch from aderrall to concerta 27mg . Plan to follow up in 3 weeks.  Orders: -     Methylphenidate HCl ER (OSM); Take 1 tablet (27 mg total) by mouth  daily.  Dispense: 30 tablet; Refill: 0  Obesity (BMI 30-39.9) Assessment & Plan: Topiramate 50mg  no changes noted, will give her more time on the higher dose.   Cigarette smoker Tobacco abuse counseling Assessment & Plan: Going well. Will send gum, insurance didn't cover lozenges. Continue 21mg  patch, will switch to 14mg  after 30 days.  Orders: -     Nicotine Polacrilex; Take 1 each (2 mg total) by mouth as needed for smoking cessation.  Dispense: 100 tablet; Refill: 0   -     Nicotine Polacrilex; Take 1 each (2 mg total) by mouth as needed for smoking cessation.  Dispense: 100 tablet; Refill: 0  Follow up plan: Return in about 3 weeks (around 09/29/2023).  Abrina Petz P Omari Mcmanaway, MD   This visit was completed via video visit through MyChart due to the restrictions of the COVID-19 pandemic. All issues as above were discussed and addressed. Physical exam was done as above through visual confirmation on video through MyChart. If it was felt that the patient should be evaluated in the office, they were directed there. The patient verbally consented to this visit."} Location of the patient: home Location of the provider: work Those involved with this call:  Provider: Modena Nunnery, MD CMA:  Maggie Font, CMA Time spent on call:  10 minutes with patient face to face via video conference. More than 50% of this time was spent in counseling and coordination of care. 20 minutes total spent in  review of patient's record and preparation of their chart.

## 2023-09-11 ENCOUNTER — Encounter: Payer: Self-pay | Admitting: Pediatrics

## 2023-09-11 MED ORDER — NICOTINE POLACRILEX 2 MG MT GUM: 2.0000 mg | CHEWING_GUM | OROMUCOSAL | 0 refills | Status: DC | PRN

## 2023-09-11 MED ORDER — METHYLPHENIDATE HCL ER (OSM) 27 MG PO TBCR: 27.0000 mg | EXTENDED_RELEASE_TABLET | Freq: Every day | ORAL | 0 refills | Status: DC

## 2023-09-11 NOTE — Assessment & Plan Note (Signed)
Topiramate 50mg  no changes noted, will give her more time on the higher dose.

## 2023-09-11 NOTE — Assessment & Plan Note (Addendum)
Going well. Will send gum, insurance didn't cover lozenges. Continue 21mg  patch, will switch to 14mg  after 30 days.

## 2023-09-11 NOTE — Assessment & Plan Note (Signed)
Plan to switch from aderrall to concerta 27mg . Plan to follow up in 3 weeks.

## 2023-09-11 NOTE — Patient Instructions (Signed)
Will send nictoine gum  Switch to concerta 27mg  daily

## 2023-09-12 ENCOUNTER — Encounter: Payer: Self-pay | Admitting: *Deleted

## 2023-09-14 ENCOUNTER — Other Ambulatory Visit: Payer: Self-pay | Admitting: Pediatrics

## 2023-09-14 ENCOUNTER — Encounter: Payer: Self-pay | Admitting: Pediatrics

## 2023-09-14 ENCOUNTER — Telehealth (INDEPENDENT_AMBULATORY_CARE_PROVIDER_SITE_OTHER): Payer: Medicaid Other | Admitting: Pediatrics

## 2023-09-14 DIAGNOSIS — F909 Attention-deficit hyperactivity disorder, unspecified type: Secondary | ICD-10-CM | POA: Diagnosis not present

## 2023-09-14 DIAGNOSIS — F413 Other mixed anxiety disorders: Secondary | ICD-10-CM | POA: Diagnosis not present

## 2023-09-14 DIAGNOSIS — G4700 Insomnia, unspecified: Secondary | ICD-10-CM

## 2023-09-14 MED ORDER — TRAZODONE HCL 50 MG PO TABS
25.0000 mg | ORAL_TABLET | Freq: Every evening | ORAL | 3 refills | Status: DC | PRN
Start: 2023-09-14 — End: 2023-10-04

## 2023-09-14 MED ORDER — AMPHETAMINE-DEXTROAMPHETAMINE 20 MG PO TABS
20.0000 mg | ORAL_TABLET | Freq: Three times a day (TID) | ORAL | 0 refills | Status: DC
Start: 2023-09-14 — End: 2023-10-04

## 2023-09-14 NOTE — Progress Notes (Signed)
Telehealth Visit  I connected with  Tracy Whitaker on 09/14/23 by a video enabled telemedicine application and verified that I am speaking with the correct person using two identifiers.   I discussed the limitations of evaluation and management by telemedicine. The patient expressed understanding and agreed to proceed.  Subjective:    Patient ID: Tracy Whitaker, female    DOB: 1981/04/30, 42 y.o.   MRN: [id number]  HPI: Tracy Whitaker is a 42 y.o. female  Chief Complaint  Patient presents with   Medication Problem    Patient says she is here to be seen to discuss different treatment options.    Discussed the use of AI scribe software for clinical note transcription with the patient, who gave verbal consent to proceed.  History of Present Illness   The patient, with a known diagnosis of ADHD, presents with recent onset of mood changes and physical symptoms after a change in medication. She initially experienced a racing heart, sweating, and emotional lability, leading to a panic attack at work. This was managed with Klonopin, and she was sent home to rest.  In the following days, the patient reported feeling lethargic and depressed, with difficulty focusing and interacting with others. She also experienced episodes of crying, both at home and at work. These symptoms led to early dismissal from work on multiple occasions.  The patient had recently switched from Adderall to Concerta for her ADHD. She reported that the symptoms began after this change in medication. She had been on Adderall for approximately ten years prior to the switch. The patient's mother suggested that the symptoms might be due to withdrawal from Adderall.  The patient denied experiencing racing thoughts or speaking rapidly. Instead, she described feeling as though she wanted to "crawl in a hole and just cry." She reached out to her healthcare provider for assistance, expressing a desire to return to her  previous medication regimen.     She denies any SI/HI.   Relevant past medical, surgical, family and social history reviewed and updated as indicated. Interim medical history since our last visit reviewed. Allergies and medications reviewed and updated.  ROS per HPI unless specifically indicated above     Objective:    LMP 08/31/2023 (Approximate)   Wt Readings from Last 3 Encounters:  08/25/23 234 lb (106.1 kg)  05/23/23 210 lb (95.3 kg)  02/28/23 213 lb 9.6 oz (96.9 kg)     Physical Exam Constitutional:      General: She is not in acute distress.    Appearance: Normal appearance.  Neurological:     General: No focal deficit present.     Mental Status: She is alert. Mental status is at baseline.      LIMITED EXAM GIVEN VIDEO VISIT     Assessment & Plan:  Assessment & Plan    Assessment and Plan    Attention deficit hyperactivity disorder (ADHD), unspecified ADHD type Patient experienced negative side effects including mood changes and lethargy with Concerta. Previously on Adderall with some breakthrough attention issues and failed higher dose 30mg  BID trial. No safety concerns. Suspect symptoms secondary to active ingredient amphetamine withdrawal. Pt given strict return precautions and anticipatory guidance for ED. -Discontinue Concerta. -Resume Adderall 20mg  three times daily. -Check-in via video visit on 09/15/2023 to assess response to medication change. -     Amphetamine-Dextroamphetamine; Take 1 tablet (20 mg total) by mouth in the morning, at noon, and at bedtime.  Dispense: 90 tablet; Refill: 0  Other mixed anxiety disorders Patient experienced a panic attack at work and required Klonopin for symptom relief. -Continue current management and monitor closely.      Follow up plan: Return in about 1 day (around 09/15/2023) for Mood.  Justine Cossin P Anthony Tamburo, MD   This visit was completed via video visit through MyChart due to the restrictions of the COVID-19  pandemic. All issues as above were discussed and addressed. Physical exam was done as above through visual confirmation on video through MyChart. If it was felt that the patient should be evaluated in the office, they were directed there. The patient verbally consented to this visit."} Location of the patient: home Location of the provider: work Those involved with this call:  Provider: Modena Nunnery, MD CMA: Malen Gauze, CMA Time spent on call:  10 minutes with patient face to face via video conference. More than 50% of this time was spent in counseling and coordination of care. 20 minutes total spent in review of patient's record and preparation of their chart.

## 2023-09-14 NOTE — Progress Notes (Signed)
Appointment has been made.

## 2023-09-14 NOTE — Telephone Encounter (Signed)
Called patient to schedule virtual visit, no answer. Left detailed message for patient to cal back and schedule.

## 2023-09-14 NOTE — Patient Instructions (Signed)
VISIT SUMMARY:  During today's visit, we discussed your recent mood changes and physical symptoms following a switch in your ADHD medication. You experienced a panic attack and subsequent feelings of lethargy and depression. We reviewed your symptoms and medication history to determine the best course of action.  YOUR PLAN:  -ADHD: ADHD, or Attention-Deficit/Hyperactivity Disorder, is a condition characterized by inattention, hyperactivity, and impulsivity. You experienced negative side effects, including mood changes and lethargy, after switching to Concerta. We have decided to discontinue Concerta and resume your previous medication, Adderall, at a dose of 20mg  three times daily. We will check in via video visit on 09/15/2023 to assess your response to this change.  -ANXIETY: Anxiety is a feeling of worry, nervousness, or unease. You experienced a panic attack at work, which was managed with Klonopin. We will continue your current management plan and monitor your symptoms closely.  INSTRUCTIONS:  Please resume taking Adderall 20mg  three times daily as previously prescribed. We will have a video visit on 09/15/2023 to assess how you are responding to the medication change. Continue to monitor your anxiety symptoms and use Klonopin as needed. If you experience any severe side effects or worsening symptoms, contact our office immediately or seek urgent medical attention if after hours.

## 2023-09-15 ENCOUNTER — Encounter: Payer: Self-pay | Admitting: Pediatrics

## 2023-09-15 ENCOUNTER — Telehealth (INDEPENDENT_AMBULATORY_CARE_PROVIDER_SITE_OTHER): Payer: Medicaid Other | Admitting: Pediatrics

## 2023-09-15 DIAGNOSIS — F909 Attention-deficit hyperactivity disorder, unspecified type: Secondary | ICD-10-CM

## 2023-09-15 DIAGNOSIS — G4700 Insomnia, unspecified: Secondary | ICD-10-CM | POA: Diagnosis not present

## 2023-09-15 NOTE — Patient Instructions (Signed)
I will see you in 3 weeks.

## 2023-09-15 NOTE — Progress Notes (Signed)
   Telehealth Visit  I connected with  Tracy Whitaker on 09/15/23 by a video enabled telemedicine application and verified that I am speaking with the correct person using two identifiers.   I discussed the limitations of evaluation and management by telemedicine. The patient expressed understanding and agreed to proceed.  Subjective:    Patient ID: Tracy Whitaker, female    DOB: 03-14-1981, 42 y.o.   MRN: [id number]  HPI: Tracy Whitaker is a 42 y.o. female  Chief Complaint  Patient presents with   ADHD   Anxiety   #ADHD  Significantly improved from yesterday Able to get up this morning and complete tasks She feels back to normal Less anxious today Got her first dose in last night, she immidiately better  Trazadone not yet started, will message if helps w intermittent insomnia No pressured speech, worsened insomnia, headaches Higher dose of stimulant helped at work No SI/HI  Relevant past medical, surgical, family and social history reviewed and updated as indicated. Interim medical history since our last visit reviewed. Allergies and medications reviewed and updated.  ROS per HPI unless specifically indicated above     Objective:    LMP 08/31/2023 (Approximate)   Wt Readings from Last 3 Encounters:  08/25/23 234 lb (106.1 kg)  05/23/23 210 lb (95.3 kg)  02/28/23 213 lb 9.6 oz (96.9 kg)     Physical Exam Constitutional:      General: She is not in acute distress.    Appearance: Normal appearance.  Neurological:     General: No focal deficit present.     Mental Status: She is alert. Mental status is at baseline.      LIMITED EXAM GIVEN VIDEO VISIT     Assessment & Plan:  Assessment & Plan   Attention deficit hyperactivity disorder (ADHD), unspecified ADHD type Assessment & Plan: Did not tolerate concerta 27mg . 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 Continue adderall 20mg  TID. Follow up in 3 weeks. No safety concerns.   Insomnia,  unspecified type Assessment & Plan: Suspect from stimulant and mental health issues. To start trazadone already in pharmacy. Continue to monitor.      Follow up plan: Return in about 3 weeks (around 10/06/2023) for Mood.  Nesha Counihan P Walther Sanagustin, MD   This visit was completed via video visit through MyChart due to the restrictions of the COVID-19 pandemic. All issues as above were discussed and addressed. Physical exam was done as above through visual confirmation on video through MyChart. If it was felt that the patient should be evaluated in the office, they were directed there. The patient verbally consented to this visit."} Location of the patient: home Location of the provider: work Those involved with this call:  Provider: Modena Nunnery, MD CMA: Wilhemena Durie, CMA Time spent on call:  10 minutes with patient face to face via video conference. More than 50% of this time was spent in counseling and coordination of care. 10 minutes total spent in review of patient's record and preparation of their chart.

## 2023-09-15 NOTE — Assessment & Plan Note (Signed)
Suspect from stimulant and mental health issues. To start trazadone already in pharmacy. Continue to monitor.

## 2023-09-15 NOTE — Assessment & Plan Note (Signed)
Did not tolerate concerta 27mg . 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 Continue adderall 20mg  TID. Follow up in 3 weeks. No safety concerns.

## 2023-09-22 ENCOUNTER — Other Ambulatory Visit: Payer: Self-pay | Admitting: Pediatrics

## 2023-09-22 DIAGNOSIS — Z716 Tobacco abuse counseling: Secondary | ICD-10-CM

## 2023-09-22 DIAGNOSIS — F17200 Nicotine dependence, unspecified, uncomplicated: Secondary | ICD-10-CM

## 2023-09-22 MED ORDER — NICOTINE 14 MG/24HR TD PT24: MEDICATED_PATCH | TRANSDERMAL | 0 refills | Status: DC

## 2023-09-22 MED ORDER — NICOTINE 7 MG/24HR TD PT24: MEDICATED_PATCH | TRANSDERMAL | 0 refills | Status: DC

## 2023-10-04 ENCOUNTER — Ambulatory Visit: Payer: Medicaid Other

## 2023-10-04 ENCOUNTER — Encounter: Payer: Self-pay | Admitting: Pediatrics

## 2023-10-04 ENCOUNTER — Telehealth (INDEPENDENT_AMBULATORY_CARE_PROVIDER_SITE_OTHER): Payer: Medicaid Other | Admitting: Pediatrics

## 2023-10-04 DIAGNOSIS — E669 Obesity, unspecified: Secondary | ICD-10-CM

## 2023-10-04 DIAGNOSIS — M797 Fibromyalgia: Secondary | ICD-10-CM | POA: Diagnosis not present

## 2023-10-04 DIAGNOSIS — Z79899 Other long term (current) drug therapy: Secondary | ICD-10-CM | POA: Insufficient documentation

## 2023-10-04 DIAGNOSIS — Z716 Tobacco abuse counseling: Secondary | ICD-10-CM

## 2023-10-04 DIAGNOSIS — F909 Attention-deficit hyperactivity disorder, unspecified type: Secondary | ICD-10-CM

## 2023-10-04 DIAGNOSIS — F17210 Nicotine dependence, cigarettes, uncomplicated: Secondary | ICD-10-CM | POA: Diagnosis not present

## 2023-10-04 DIAGNOSIS — G4700 Insomnia, unspecified: Secondary | ICD-10-CM

## 2023-10-04 MED ORDER — AMPHETAMINE-DEXTROAMPHETAMINE 20 MG PO TABS
20.0000 mg | ORAL_TABLET | Freq: Three times a day (TID) | ORAL | 0 refills | Status: DC
Start: 2023-12-03 — End: 2024-01-04

## 2023-10-04 MED ORDER — AMPHETAMINE-DEXTROAMPHETAMINE 20 MG PO TABS
20.0000 mg | ORAL_TABLET | Freq: Three times a day (TID) | ORAL | 0 refills | Status: DC
Start: 2023-11-03 — End: 2024-01-04

## 2023-10-04 MED ORDER — TRAZODONE HCL 100 MG PO TABS: 50.0000 mg | ORAL_TABLET | Freq: Every day | ORAL | 3 refills | Status: AC

## 2023-10-04 MED ORDER — TOPIRAMATE 50 MG PO TABS: 50.0000 mg | ORAL_TABLET | Freq: Two times a day (BID) | ORAL | 0 refills | Status: DC

## 2023-10-04 MED ORDER — AMPHETAMINE-DEXTROAMPHETAMINE 20 MG PO TABS: 20.0000 mg | ORAL_TABLET | Freq: Three times a day (TID) | ORAL | 0 refills | Status: DC

## 2023-10-04 MED ORDER — METHOCARBAMOL 500 MG PO TABS
500.0000 mg | ORAL_TABLET | Freq: Two times a day (BID) | ORAL | 2 refills | Status: DC | PRN
Start: 2023-10-04 — End: 2024-03-15

## 2023-10-04 NOTE — Assessment & Plan Note (Signed)
Pt with improved insomnia. Will increase trazadone dose to 50-100mg . Continue to monitor.

## 2023-10-04 NOTE — Assessment & Plan Note (Addendum)
Patient doing well on higher dose, 20mg  TID. She denies any side effects. PDMP reviewed. Sent 3 month refills. Suspect this is also contributing to her insomnia issues.

## 2023-10-04 NOTE — Assessment & Plan Note (Signed)
On robaxin 500mg  BID chronically. Muscle spasms well controlled. Continue to monitor.

## 2023-10-04 NOTE — Assessment & Plan Note (Signed)
Concern over stimulating and sedating medications. Discussed oversedation precautions. Patient stable on current dosing but advise against adding more agents. Sees neurology for klonopin refills. Will continue to monitor.

## 2023-10-04 NOTE — Patient Instructions (Addendum)
Plan for today: - increase topiramate to 50mg  twice daily to help with night time cravings - continue current dose of adderall - I sent your 7mg  patches already but if need to extend the 14mg  nicotine patches let me know - I sent your robaxin refills

## 2023-10-04 NOTE — Progress Notes (Signed)
Scheduled appointment on 01/04/2024 @ 11:20 am.

## 2023-10-04 NOTE — Assessment & Plan Note (Signed)
Continues night time snakcing. Plan to increase topiramate dose to 50mg  BID. May have additional sleep benefit. Cautioned patient against oversedation given other medications.

## 2023-10-04 NOTE — Progress Notes (Signed)
Telehealth Visit  I connected with  Tracy Whitaker on 10/04/23 by a video enabled telemedicine application and verified that I am speaking with the correct person using two identifiers.   I discussed the limitations of evaluation and management by telemedicine. The patient expressed understanding and agreed to proceed.  Subjective:    Patient ID: Tracy Whitaker, female    DOB: [birth year], 42 y.o.   MRN: [id number]  HPI: Tracy Whitaker is a 42 y.o. female  Chief Complaint  Patient presents with   ADHD   #ADHD Follow up ADHD: patient is currently treated with Adderall 20 mg TID. She believes that the medication is effective. Has been able to function more and complete tasks at work and person life. Reports the following side effects: none.  #insomnia She has had some benefit on trazadone, still gets up a few times throughout the night Would like to increase dose  #Weight management Has not noticed benefit on higher dose topiramate in terms of night time snacking  #smoking cessation Going very well. She is on 14mg  patch. Using non-nicotine vapes.   #fibromyalgia She has chronically be on robaxin 500mg  BID, medication was not listed She is requesting refills today  Relevant past medical, surgical, family and social history reviewed and updated as indicated. Interim medical history since our last visit reviewed. Allergies and medications reviewed and updated.  ROS per HPI unless specifically indicated above     Objective:    LMP 08/31/2023 (Approximate)   Wt Readings from Last 3 Encounters:  08/25/23 234 lb (106.1 kg)  05/23/23 210 lb (95.3 kg)  02/28/23 213 lb 9.6 oz (96.9 kg)    Physical Exam Constitutional:      General: She is not in acute distress.    Appearance: Normal appearance.  Neurological:     General: No focal deficit present.     Mental Status: She is alert. Mental status is at baseline.    LIMITED EXAM GIVEN VIDEO VISIT     Assessment &  Plan:  Assessment & Plan   Attention deficit hyperactivity disorder (ADHD), unspecified ADHD type Assessment & Plan: Patient doing well on higher dose, 20mg  TID. She denies any side effects. PDMP reviewed. Sent 3 month refills. Suspect this is also contributing to her insomnia issues.  Orders: -     Amphetamine-Dextroamphetamine; Take 1 tablet (20 mg total) by mouth in the morning, at noon, and at bedtime.  Dispense: 90 tablet; Refill: 0 -     Amphetamine-Dextroamphetamine; Take 1 tablet (20 mg total) by mouth in the morning, at noon, and at bedtime.  Dispense: 90 tablet; Refill: 0 -     Amphetamine-Dextroamphetamine; Take 1 tablet (20 mg total) by mouth in the morning, at noon, and at bedtime.  Dispense: 90 tablet; Refill: 0  Insomnia, unspecified type Assessment & Plan: Pt with improved insomnia. Will increase trazadone dose to 50-100mg . Continue to monitor.  Orders: -     traZODone HCl; Take 0.5-1 tablets (50-100 mg total) by mouth at bedtime.  Dispense: 90 tablet; Refill: 3  Fibromyalgia Assessment & Plan: On robaxin 500mg  BID chronically. Muscle spasms well controlled. Continue to monitor.  Orders: -     Methocarbamol; Take 1 tablet (500 mg total) by mouth 2 (two) times daily as needed for muscle spasms.  Dispense: 60 tablet; Refill: 2  Obesity (BMI 30-39.9) Assessment & Plan: Continues night time snakcing. Plan to increase topiramate dose to 50mg  BID. May have additional sleep benefit. Cautioned patient  against oversedation given other medications.  Orders: -     Topiramate; Take 1 tablet (50 mg total) by mouth 2 (two) times daily. Take 1 (25mg ) tab in the morning and take 2 tabs (50mg ) at night  Dispense: 180 tablet; Refill: 0  Polypharmacy Assessment & Plan: Concern over stimulating and sedating medications. Discussed oversedation precautions. Patient stable on current dosing but advise against adding more agents. Sees neurology for klonopin refills. Will continue to  monitor.  Tobacco abuse counseling Cigarette nicotine dependence without complication Assessment & Plan: Discussed cessation and available treatment. She doing well on cessation plan, continue patch titration.   Follow up plan: Return in about 3 months (around 01/04/2024) for ADHD.  Malillany Kazlauskas P Janyia Guion, MD   This visit was completed via video visit through MyChart due to the restrictions of the COVID-19 pandemic. All issues as above were discussed and addressed. Physical exam was done as above through visual confirmation on video through MyChart. If it was felt that the patient should be evaluated in the office, they were directed there. The patient verbally consented to this visit."} Location of the patient: work Location of the provider: home Those involved with this call:  Provider: Modena Nunnery, MD Time spent on call:  15 minutes on the phone discussing health concerns. 15 minutes total spent in review of patient's record and preparation of their chart.

## 2023-10-04 NOTE — Assessment & Plan Note (Signed)
Discussed cessation and available treatment. She doing well on cessation plan, continue patch titration.

## 2023-10-05 DIAGNOSIS — M797 Fibromyalgia: Secondary | ICD-10-CM | POA: Diagnosis not present

## 2023-10-05 DIAGNOSIS — G629 Polyneuropathy, unspecified: Secondary | ICD-10-CM | POA: Diagnosis not present

## 2023-10-05 DIAGNOSIS — R32 Unspecified urinary incontinence: Secondary | ICD-10-CM | POA: Diagnosis not present

## 2023-10-15 ENCOUNTER — Other Ambulatory Visit: Payer: Self-pay | Admitting: Pediatrics

## 2023-10-15 DIAGNOSIS — E669 Obesity, unspecified: Secondary | ICD-10-CM

## 2023-10-17 NOTE — Telephone Encounter (Signed)
Topamax #60 1 RF ordered 08/25/23-10/04/23 Requested Prescriptions  Refused Prescriptions Disp Refills   topiramate (TOPAMAX) 25 MG tablet [Pharmacy Med Name: TOPIRAMATE 25MG  TABLETS] 60 tablet 1    Sig: TAKE 1 TABLET(25 MG) BY MOUTH IN THE MORNING AND 2 TABLETS(50 MG) AT NIGHT     Neurology: Anticonvulsants - topiramate & zonisamide Passed - 10/15/2023  9:07 AM      Passed - Cr in normal range and within 360 days    Creatinine  Date Value Ref Range Status  02/28/2023 22.0 20.0 - 300.0 mg/dL Final   Creatinine, Ser  Date Value Ref Range Status  08/25/2023 0.91 0.57 - 1.00 mg/dL Final         Passed - CO2 in normal range and within 360 days    CO2  Date Value Ref Range Status  08/25/2023 25 20 - 29 mmol/L Final         Passed - ALT in normal range and within 360 days    ALT  Date Value Ref Range Status  08/25/2023 17 0 - 32 IU/L Final         Passed - AST in normal range and within 360 days    AST  Date Value Ref Range Status  08/25/2023 18 0 - 40 IU/L Final         Passed - Completed PHQ-2 or PHQ-9 in the last 360 days      Passed - Valid encounter within last 12 months    Recent Outpatient Visits           1 week ago Attention deficit hyperactivity disorder (ADHD), unspecified ADHD type   [place] [facility] Jackolyn Confer, MD   1 month ago Attention deficit hyperactivity disorder (ADHD), unspecified ADHD type   [place] [facility] Jackolyn Confer, MD   1 month ago Attention deficit hyperactivity disorder (ADHD), unspecified ADHD type   [place] [facility] Jackolyn Confer, MD   1 month ago Attention deficit hyperactivity disorder (ADHD), unspecified ADHD type   [place] [facility] Jackolyn Confer, MD   1 month ago Attention deficit hyperactivity disorder (ADHD), unspecified ADHD type   [place] [facility] Jackolyn Confer, MD       Future Appointments             In 2 months  Evelene Croon, Atilano Median, MD [place] [facility]

## 2023-10-24 ENCOUNTER — Other Ambulatory Visit: Payer: Self-pay | Admitting: Physician Assistant

## 2023-10-27 NOTE — Telephone Encounter (Signed)
Requested Prescriptions  Pending Prescriptions Disp Refills   CRYSELLE-28 0.3-30 MG-MCG tablet [Pharmacy Med Name: CRYSELLE TABLETS 28S] 84 tablet 0    Sig: TAKE 1 TABLET BY MOUTH EVERY DAY     OB/GYN:  Contraceptives Passed - 10/24/2023  3:45 PM      Passed - Last BP in normal range    BP Readings from Last 1 Encounters:  08/25/23 103/70         Passed - Valid encounter within last 12 months    Recent Outpatient Visits           3 weeks ago Attention deficit hyperactivity disorder (ADHD), unspecified ADHD type   [place] [facility] Jackolyn Confer, MD   1 month ago Attention deficit hyperactivity disorder (ADHD), unspecified ADHD type   [place] [facility] Jackolyn Confer, MD   1 month ago Attention deficit hyperactivity disorder (ADHD), unspecified ADHD type   [place] [facility] Jackolyn Confer, MD   1 month ago Attention deficit hyperactivity disorder (ADHD), unspecified ADHD type   [place] [facility] Jackolyn Confer, MD   2 months ago Attention deficit hyperactivity disorder (ADHD), unspecified ADHD type   [place] [facility] Jackolyn Confer, MD       Future Appointments             In 2 months Evelene Croon, Atilano Median, MD [place] [facility]            Passed - Patient is not a smoker

## 2023-10-28 ENCOUNTER — Other Ambulatory Visit: Payer: Self-pay | Admitting: Pediatrics

## 2023-10-28 DIAGNOSIS — E669 Obesity, unspecified: Secondary | ICD-10-CM

## 2023-10-31 NOTE — Telephone Encounter (Signed)
Requested medication (s) are due for refill today: yes  Requested medication (s) are on the active medication list: yes    Last refill: 08/25/23  #60  1 refill  Future visit scheduled yes 01/04/24  Notes to clinic:Current med profiles reads 50 Mg tab twice a day. Please review. Thank you.  Requested Prescriptions  Pending Prescriptions Disp Refills   topiramate (TOPAMAX) 25 MG tablet [Pharmacy Med Name: TOPIRAMATE 25MG  TABLETS] 60 tablet 1    Sig: TAKE 1 TABLET(25 MG) BY MOUTH IN THE MORNING AND 2 TABLETS(50 MG) AT NIGHT     Neurology: Anticonvulsants - topiramate & zonisamide Passed - 10/28/2023 11:13 AM      Passed - Cr in normal range and within 360 days    Creatinine  Date Value Ref Range Status  02/28/2023 22.0 20.0 - 300.0 mg/dL Final   Creatinine, Ser  Date Value Ref Range Status  08/25/2023 0.91 0.57 - 1.00 mg/dL Final         Passed - CO2 in normal range and within 360 days    CO2  Date Value Ref Range Status  08/25/2023 25 20 - 29 mmol/L Final         Passed - ALT in normal range and within 360 days    ALT  Date Value Ref Range Status  08/25/2023 17 0 - 32 IU/L Final         Passed - AST in normal range and within 360 days    AST  Date Value Ref Range Status  08/25/2023 18 0 - 40 IU/L Final         Passed - Completed PHQ-2 or PHQ-9 in the last 360 days      Passed - Valid encounter within last 12 months    Recent Outpatient Visits           3 weeks ago Attention deficit hyperactivity disorder (ADHD), unspecified ADHD type   [place] [facility] Jackolyn Confer, MD   1 month ago Attention deficit hyperactivity disorder (ADHD), unspecified ADHD type   [place] [facility] Jackolyn Confer, MD   1 month ago Attention deficit hyperactivity disorder (ADHD), unspecified ADHD type   [place] [facility] Jackolyn Confer, MD   1 month ago Attention deficit hyperactivity disorder (ADHD), unspecified ADHD type   [facility] Jackolyn Confer, MD   2 months ago Attention deficit hyperactivity disorder (ADHD), unspecified ADHD type   [place] Jackolyn Confer, MD       Future Appointments             In 2 months Evelene Croon, Atilano Median, MD [place] [facility], [facility]

## 2023-11-02 ENCOUNTER — Other Ambulatory Visit: Payer: Self-pay | Admitting: Pediatrics

## 2023-11-24 DIAGNOSIS — G479 Sleep disorder, unspecified: Secondary | ICD-10-CM | POA: Diagnosis not present

## 2023-11-24 DIAGNOSIS — G629 Polyneuropathy, unspecified: Secondary | ICD-10-CM | POA: Diagnosis not present

## 2023-11-24 DIAGNOSIS — R200 Anesthesia of skin: Secondary | ICD-10-CM | POA: Diagnosis not present

## 2023-11-24 DIAGNOSIS — M797 Fibromyalgia: Secondary | ICD-10-CM | POA: Diagnosis not present

## 2023-11-24 DIAGNOSIS — R29898 Other symptoms and signs involving the musculoskeletal system: Secondary | ICD-10-CM | POA: Diagnosis not present

## 2023-11-24 DIAGNOSIS — R4189 Other symptoms and signs involving cognitive functions and awareness: Secondary | ICD-10-CM | POA: Diagnosis not present

## 2023-11-24 DIAGNOSIS — R202 Paresthesia of skin: Secondary | ICD-10-CM | POA: Diagnosis not present

## 2023-11-24 DIAGNOSIS — R531 Weakness: Secondary | ICD-10-CM | POA: Diagnosis not present

## 2023-11-24 DIAGNOSIS — R251 Tremor, unspecified: Secondary | ICD-10-CM | POA: Diagnosis not present

## 2023-12-01 DIAGNOSIS — G629 Polyneuropathy, unspecified: Secondary | ICD-10-CM | POA: Diagnosis not present

## 2023-12-01 DIAGNOSIS — F339 Major depressive disorder, recurrent, unspecified: Secondary | ICD-10-CM | POA: Diagnosis not present

## 2023-12-01 DIAGNOSIS — M797 Fibromyalgia: Secondary | ICD-10-CM | POA: Diagnosis not present

## 2023-12-08 ENCOUNTER — Other Ambulatory Visit: Payer: Self-pay | Admitting: Nurse Practitioner

## 2023-12-09 NOTE — Telephone Encounter (Signed)
Requested Prescriptions  Pending Prescriptions Disp Refills   spironolactone (ALDACTONE) 100 MG tablet [Pharmacy Med Name: SPIRONOLACTONE 100MG  TABLETS] 90 tablet 0    Sig: TAKE 1 TABLET(100 MG) BY MOUTH DAILY     Cardiovascular: Diuretics - Aldosterone Antagonist Passed - 12/09/2023  9:12 AM      Passed - Cr in normal range and within 180 days    Creatinine  Date Value Ref Range Status  02/28/2023 22.0 20.0 - 300.0 mg/dL Final   Creatinine, Ser  Date Value Ref Range Status  08/25/2023 0.91 0.57 - 1.00 mg/dL Final         Passed - K in normal range and within 180 days    Potassium  Date Value Ref Range Status  08/25/2023 4.1 3.5 - 5.2 mmol/L Final         Passed - Na in normal range and within 180 days    Sodium  Date Value Ref Range Status  08/25/2023 141 134 - 144 mmol/L Final         Passed - eGFR is 30 or above and within 180 days    GFR calc Af Amer  Date Value Ref Range Status  10/28/2020 88 >59 mL/min/1.73 Final    Comment:    **In accordance with recommendations from the NKF-ASN Task force,**   [facility] is in the process of updating its eGFR calculation to the   2021 CKD-EPI creatinine equation that estimates kidney function   without a race variable.    GFR calc non Af Amer  Date Value Ref Range Status  10/28/2020 77 >59 mL/min/1.73 Final   eGFR  Date Value Ref Range Status  08/25/2023 81 >59 mL/min/1.73 Final         Passed - Last BP in normal range    BP Readings from Last 1 Encounters:  08/25/23 103/70         Passed - Valid encounter within last 6 months    Recent Outpatient Visits           2 months ago Attention deficit hyperactivity disorder (ADHD), unspecified ADHD type   [place] [facility] Jackolyn Confer, MD   2 months ago Attention deficit hyperactivity disorder (ADHD), unspecified ADHD type   [place] [facility] Jackolyn Confer, MD   2 months ago Attention deficit hyperactivity disorder (ADHD),  unspecified ADHD type   [place] [facility] Jackolyn Confer, MD   3 months ago Attention deficit hyperactivity disorder (ADHD), unspecified ADHD type   [place] [facility] Jack[facility]yn Confer, MD   3 months ago Attention deficit hyperactivity disorder (ADHD), unspecified ADHD type   [place] [facility] Jackolyn Confer, MD       Future Appointments             In 3 weeks Evelene Croon, Atilano Median, MD [place] [facility], [facility]

## 2024-01-04 ENCOUNTER — Telehealth: Payer: Medicaid Other | Admitting: Pediatrics

## 2024-01-04 VITALS — Ht 64.0 in | Wt 231.0 lb

## 2024-01-04 DIAGNOSIS — E669 Obesity, unspecified: Secondary | ICD-10-CM | POA: Diagnosis not present

## 2024-01-04 DIAGNOSIS — M2550 Pain in unspecified joint: Secondary | ICD-10-CM | POA: Diagnosis not present

## 2024-01-04 DIAGNOSIS — Z1330 Encounter for screening examination for mental health and behavioral disorders, unspecified: Secondary | ICD-10-CM | POA: Diagnosis not present

## 2024-01-04 DIAGNOSIS — M797 Fibromyalgia: Secondary | ICD-10-CM | POA: Diagnosis not present

## 2024-01-04 DIAGNOSIS — M79673 Pain in unspecified foot: Secondary | ICD-10-CM | POA: Diagnosis not present

## 2024-01-04 DIAGNOSIS — F909 Attention-deficit hyperactivity disorder, unspecified type: Secondary | ICD-10-CM

## 2024-01-04 DIAGNOSIS — Z6839 Body mass index (BMI) 39.0-39.9, adult: Secondary | ICD-10-CM | POA: Diagnosis not present

## 2024-01-04 DIAGNOSIS — F3132 Bipolar disorder, current episode depressed, moderate: Secondary | ICD-10-CM | POA: Diagnosis not present

## 2024-01-04 DIAGNOSIS — Z79899 Other long term (current) drug therapy: Secondary | ICD-10-CM

## 2024-01-04 MED ORDER — SEMAGLUTIDE-WEIGHT MANAGEMENT 0.25 MG/0.5ML ~~LOC~~ SOAJ: 0.2500 mg | SUBCUTANEOUS | 0 refills | Status: DC

## 2024-01-04 MED ORDER — DULOXETINE HCL 20 MG PO CPEP: 20.0000 mg | ORAL_CAPSULE | Freq: Every day | ORAL | 1 refills | Status: DC

## 2024-01-04 MED ORDER — AMPHETAMINE-DEXTROAMPHETAMINE 20 MG PO TABS: 20.0000 mg | ORAL_TABLET | Freq: Three times a day (TID) | ORAL | 0 refills | Status: DC

## 2024-01-04 NOTE — Patient Instructions (Addendum)
Start cymbalta 20mg  daily, if tolerating ok after 3-4 weeks we will plan to increase to 40mg  but we will check in before then Continue to minimize the 3rd klonopin if possible and replace with the trazodone. You may find that you need that less and less  Once we have you on a stable dose of the cymbalta we can discuss coming off of the other medications.  We will try to process coverage for ozempic or one of the other similar medications  I also placed a referral to physical therapy for aquatherapy. There are some other helful exercises for fibromyalgia on youtube that you can check out in the meantime: [email or web address].cy?[email or web address].

## 2024-01-04 NOTE — Progress Notes (Signed)
Telehealth Visit  I connected with  Tracy Whitaker on 01/08/24 by a video enabled telemedicine application and verified that I am speaking with the correct person using two identifiers.   I discussed the limitations of evaluation and management by telemedicine. The patient expressed understanding and agreed to proceed.  Subjective:    Patient ID: Tracy Whitaker, female    DOB: 1981-01-25, 43 y.o.   MRN: [id number]  HPI: Tracy Whitaker is a 43 y.o. female  Chief Complaint  Patient presents with   ADHD   Weight Management Screening    Pain management stated that she would do better with ozepmic due to weight gain   mood    Cymbalta was suggested for mood per pain management     Discussed the use of AI scribe software for clinical note transcription with the patient, who gave verbal consent to proceed.  History of Present Illness   Tracy Whitaker is a 43 year old female with fibromyalgia who presents with medication management and pain concerns. She was referred by her pain management team for medication adjustments.  Her current medication regimen includes Lyrica, which was recently increased, leading to the discontinuation of Topamax. She is also on Abilify 10 mg taken twice at night, Klonopin three times a day, Adderall 20 mg three times a day, Effexor, and Depakote 75 mg. She has not taken Effexor for a month due to financial constraints and reports no withdrawal symptoms. She is interested in trying Cymbalta for fibromyalgia pain, as she continues to experience significant pain despite the increased Lyrica dose.  She has pain related to fibromyalgia and arthritis in her foot, which worsens with cold weather. She has not previously seen a podiatrist for this issue. She has a history of plantar fasciitis but not for arthritis.  Weight management is a concern, with a recent weight of 250 pounds. She has lost 19 pounds over the past month but has hit a plateau.  Regarding  her mental health, she is currently managing her medications with her primary care provider and has not seen a psychiatrist recently. She finds Klonopin and Adderall particularly effective, noting that missing Klonopin results in increased muscle aches and spasms. She sometimes skips the third dose of Klonopin if she feels sleepy from trazodone at night.  She is interested in starting physical therapy, particularly aqua therapy, to help manage her fibromyalgia symptoms. She has access to a pool in the summer and is considering joining a [employer] for water exercises.      Reviewed psych meds including: Abilify 20mg  at bedtime (two pills) Klonpin 0.5mg  tid  Adderrall 20MG  TID Effexor 75mg  - stopped on her own Depakote 250mg  BID Trazadone 25-50mg  qhs  Relevant past medical, surgical, family and social history reviewed and updated as indicated. Interim medical history since our last visit reviewed. Allergies and medications reviewed and updated.  ROS per HPI unless specifically indicated above     Objective:    Ht 5\' 4"  (1.626 m)   Wt 231 lb (104.8 kg)   BMI 39.65 kg/m   Wt Readings from Last 3 Encounters:  01/04/24 231 lb (104.8 kg)  08/25/23 234 lb (106.1 kg)  05/23/23 210 lb (95.3 kg)     Physical Exam Constitutional:      General: She is not in acute distress.    Appearance: Normal appearance.  Neurological:     General: No focal deficit present.     Mental Status: She is alert. Mental  status is at baseline.     LIMITED EXAM GIVEN VIDEO VISIT     01/04/2024   11:27 AM 09/08/2023    3:44 PM 08/25/2023    3:07 PM 05/23/2023   10:39 AM 02/28/2023    2:54 PM  Depression screen PHQ 2/9  Decreased Interest 0 0 0 0 0  Down, Depressed, Hopeless  0 0 0 1  PHQ - 2 Score 0 0 0 0 1  Altered sleeping 0 0 2 1 3   Tired, decreased energy 3 0 2 3 1   Change in appetite 3 0 2 3 3   Feeling bad or failure about yourself  0 0 1 0 2  Trouble concentrating 0 0 3 0 2  Moving slowly or  fidgety/restless 0 0 0 0 0  Suicidal thoughts 0 0 0 0 0  PHQ-9 Score 6 0 10 7 12   Difficult doing work/chores  Not difficult at all Somewhat difficult Not difficult at all Somewhat difficult      01/04/2024   11:26 AM 09/08/2023    3:44 PM 08/25/2023    3:07 PM 05/23/2023   10:40 AM  GAD 7 : Generalized Anxiety Score  Nervous, Anxious, on Edge 0 0 0 0  Control/stop worrying 0 0 2 0  Worry too much - different things 0 0 2 0  Trouble relaxing 0 0 2 0  Restless 0 0 0 0  Easily annoyed or irritable 0 0 0 0  Afraid - awful might happen 0 0 0 0  Total GAD 7 Score 0 0 6 0  Anxiety Difficulty  Not difficult at all Somewhat difficult Not difficult at all          Assessment & Plan:  Assessment & Plan   Fibromyalgia Assessment & Plan: Increased pain despite up-titration of Lyrica and discontinuation of Topamax. Discussed potential benefits of Cymbalta for fibromyalgia and mood stabilization. Difficulty with exercise due to fibromyalgia. Discussed benefits of water exercises. -Refer to physical therapy for aqua therapy. -Initiate Cymbalta and monitor for changes in fibromyalgia symptoms and mood. -Discontinue Effexor as patient has been off it for a month without noticeable difference.  Orders: -     DULoxetine HCl; Take 1 capsule (20 mg total) by mouth daily.  Dispense: 30 capsule; Refill: 1 -     Semaglutide-Weight Management; Inject 0.25 mg into the skin once a week.  Dispense: 2 mL; Refill: 0  Polypharmacy Assessment & Plan: Multiple mood stabilizers including Abilify, Klonopin, Adderall, and Depakote. Discussed potential for medication interactions and benefits of consolidation. She is prescribed klonopin, Depakote and Abilify by neurology. Will do slow medication switches given prior intolerance of coming off adderall.  -Continue current regimen and monitor closely. -Consider potential adjustments after Cymbalta has been initiated and patient is stable.   Bipolar 1 disorder,  depressed, moderate (HCC) Assessment & Plan: Off effexor. No recent mania. On abilfy. Starting cymbalta.  Orders: -     DULoxetine HCl; Take 1 capsule (20 mg total) by mouth daily.  Dispense: 30 capsule; Refill: 1  Attention deficit hyperactivity disorder (ADHD), unspecified ADHD type Assessment & Plan: Stable on current regimen. Will work to decrease dose again after stabilization on cymbalta.  Orders: -     Amphetamine-Dextroamphetamine; Take 1 tablet (20 mg total) by mouth in the morning, at noon, and at bedtime.  Dispense: 90 tablet; Refill: 0 -     Amphetamine-Dextroamphetamine; Take 1 tablet (20 mg total) by mouth in the morning, at noon, and at  bedtime.  Dispense: 90 tablet; Refill: 0 -     Amphetamine-Dextroamphetamine; Take 1 tablet (20 mg total) by mouth in the morning, at noon, and at bedtime.  Dispense: 90 tablet; Refill: 0  Obesity (BMI 30-39.9) Assessment & Plan: Patient has lost 19 lbs in the past month but has hit a plateau. Discussed potential benefits of Ozempic or similar medications for weight management. Recently discontinued topiramate which was helping with appetite suppression. -Check insurance coverage for Ozempic or similar medications. -If approved, obtain labs prior to initiation and follow up in a month after starting.  Orders: -     Semaglutide-Weight Management; Inject 0.25 mg into the skin once a week.  Dispense: 2 mL; Refill: 0  Polyarthralgia Pain of foot, unspecified laterality Assessment & Plan: Unclear if concern related to fibromyalgia. Requesting podiatry eval. Limited exam given video visit.  Orders: -     Semaglutide-Weight Management; Inject 0.25 mg into the skin once a week.  Dispense: 2 mL; Refill: 0 -     Ambulatory referral to Podiatry  Encounter for behavioral health screening As part of their intake evaluation, the patient was screened for depression, anxiety.  PHQ9 SCORE 6, GAD7 SCORE 0. Screening results negative for tested  conditions, in remission. See plan under problem/diagnosis above.   Follow up plan: Return in about 3 months (around 04/02/2024) for Mood.  Tracy Balster P Cristie Mckinney, MD   This visit was completed via video visit through MyChart due to the restrictions of the COVID-19 pandemic. All issues as above were discussed and addressed. Physical exam was done as above through visual confirmation on video through MyChart. If it was felt that the patient should be evaluated in the office, they were directed there. The patient verbally consented to this visit."} Location of the patient: work Location of the provider: work Those involved with this call:  Provider: Modena Nunnery, MD Time spent on call:  15 minutes with patient face to face via video conference. More than 50% of this time was spent in counseling and coordination of care. 15 minutes total spent in review of patient's record and preparation of their chart. Total time spent on this encounter: 30 minutes.

## 2024-01-05 ENCOUNTER — Telehealth: Payer: Self-pay

## 2024-01-05 NOTE — Telephone Encounter (Signed)
PA for [facility] initiated and submitted via Cover My Meds. Key: Kavin Leech

## 2024-01-08 ENCOUNTER — Encounter: Payer: Self-pay | Admitting: Pediatrics

## 2024-01-08 NOTE — Assessment & Plan Note (Signed)
Patient has lost 19 lbs in the past month but has hit a plateau. Discussed potential benefits of Ozempic or similar medications for weight management. -Check insurance coverage for Ozempic or similar medications. -If approved, obtain labs prior to initiation and follow up in a month after starting.

## 2024-01-08 NOTE — Assessment & Plan Note (Addendum)
Increased pain despite up-titration of Lyrica and discontinuation of Topamax. Discussed potential benefits of Cymbalta for fibromyalgia and mood stabilization. Difficulty with exercise due to fibromyalgia. Discussed benefits of water exercises. -Refer to physical therapy for aqua therapy. -Initiate Cymbalta and monitor for changes in fibromyalgia symptoms and mood. -Discontinue Effexor as patient has been off it for a month without noticeable difference.

## 2024-01-08 NOTE — Assessment & Plan Note (Signed)
Multiple mood stabilizers including Abilify, Klonopin, Adderall, and Depakote. Discussed potential for medication interactions and benefits of consolidation. She is prescribed klonopin, Depakote and Abilify by neurology. Will do slow medication switches given prior intolerance of coming off adderall.  -Continue current regimen and monitor closely. -Consider potential adjustments after Cymbalta has been initiated and patient is stable.

## 2024-01-08 NOTE — Assessment & Plan Note (Signed)
Stable on current regimen. Will work to decrease dose again after stabilization on cymbalta.

## 2024-01-08 NOTE — Assessment & Plan Note (Signed)
Unclear if concern related to fibromyalgia. Requesting podiatry eval. Limited exam given video visit.

## 2024-01-08 NOTE — Assessment & Plan Note (Signed)
Off effexor. No recent mania. On abilfy. Starting cymbalta.

## 2024-01-09 NOTE — Progress Notes (Signed)
Called patient left detailed message on machine to schedule a 3 month follow up appt

## 2024-01-11 NOTE — Telephone Encounter (Signed)
 PA was approved.

## 2024-01-17 ENCOUNTER — Other Ambulatory Visit: Payer: Self-pay | Admitting: Pediatrics

## 2024-01-17 DIAGNOSIS — E669 Obesity, unspecified: Secondary | ICD-10-CM

## 2024-01-17 DIAGNOSIS — M797 Fibromyalgia: Secondary | ICD-10-CM

## 2024-01-17 DIAGNOSIS — M2550 Pain in unspecified joint: Secondary | ICD-10-CM

## 2024-01-17 MED ORDER — SEMAGLUTIDE-WEIGHT MANAGEMENT 0.5 MG/0.5ML ~~LOC~~ SOAJ: 0.5000 mg | SUBCUTANEOUS | 0 refills | Status: DC

## 2024-01-17 NOTE — Progress Notes (Signed)
 Sent 0.5mg  future order wegovy  Jackolyn Confer, MD

## 2024-01-18 ENCOUNTER — Other Ambulatory Visit: Payer: Self-pay | Admitting: Physician Assistant

## 2024-01-18 NOTE — Telephone Encounter (Signed)
 Called patient to schedule appt for 2 month f/u for weight management . Left detailed message for patient to call back and schedule appt. Put CRM in. Okay for E2C2 to schedule appt if patient calls back.

## 2024-01-19 ENCOUNTER — Other Ambulatory Visit: Payer: Self-pay | Admitting: Pediatrics

## 2024-01-19 DIAGNOSIS — G4700 Insomnia, unspecified: Secondary | ICD-10-CM

## 2024-01-19 NOTE — Telephone Encounter (Signed)
 Requested Prescriptions  Pending Prescriptions Disp Refills   CRYSELLE-28 0.3-30 MG-MCG tablet [Pharmacy Med Name: CRYSELLE TABLETS 28S] 84 tablet 0    Sig: TAKE 1 TABLET BY MOUTH EVERY DAY     OB/GYN:  Contraceptives Passed - 01/19/2024 12:05 PM      Passed - Last BP in normal range    BP Readings from Last 1 Encounters:  08/25/23 103/70         Passed - Valid encounter within last 12 months    Recent Outpatient Visits           3 months ago Attention deficit hyperactivity disorder (ADHD), unspecified ADHD type   [place] [facility] Jackolyn Confer, MD   4 months ago Attention deficit hyperactivity disorder (ADHD), unspecified ADHD type   [place] [facility] Jackolyn Confer, MD   4 months ago Attention deficit hyperactivity disorder (ADHD), unspecified ADHD type   [place] [facility] Jackolyn Confer, MD   4 months ago Attention deficit hyperactivity disorder (ADHD), unspecified ADHD type   [place] [facility] Jackolyn Confer, MD   4 months ago Attention deficit hyperactivity disorder (ADHD), unspecified ADHD type   [place] [facility] Jackolyn Confer, MD              Passed - Patient is not a smoker

## 2024-01-20 NOTE — Telephone Encounter (Signed)
 Rx was sent to pharmacy on 09/14/23 #90/3 RF  Requested Prescriptions  Pending Prescriptions Disp Refills   traZODone (DESYREL) 50 MG tablet [Pharmacy Med Name: TRAZODONE 50MG  TABLETS] 30 tablet 3    Sig: TAKE 1/2 TO 1 TABLET(25 TO 50 MG) BY MOUTH AT BEDTIME AS NEEDED FOR SLEEP     Psychiatry: Antidepressants - Serotonin Modulator Passed - 01/20/2024  1:48 PM      Passed - Completed PHQ-2 or PHQ-9 in the last 360 days      Passed - Valid encounter within last 6 months    Recent Outpatient Visits           3 months ago Attention deficit hyperactivity disorder (ADHD), unspecified ADHD type   [place] [facility] Jackolyn Confer, MD   4 months ago Attention deficit hyperactivity disorder (ADHD), unspecified ADHD type   [place] [facility] Jackolyn Confer, MD   4 months ago Attention deficit hyperactivity disorder (ADHD), unspecified ADHD type   [place] [facility] Jackolyn Confer, MD   4 months ago Attention deficit hyperactivity disorder (ADHD), unspecified ADHD type   [place] [facility] Jackolyn Confer, MD   4 months ago Attention deficit hyperactivity disorder (ADHD), unspecified ADHD type   [place] [facility] Jackolyn Confer, MD

## 2024-01-24 ENCOUNTER — Ambulatory Visit: Payer: Medicaid Other | Admitting: Podiatry

## 2024-01-24 DIAGNOSIS — M19072 Primary osteoarthritis, left ankle and foot: Secondary | ICD-10-CM | POA: Diagnosis not present

## 2024-01-24 DIAGNOSIS — M216X1 Other acquired deformities of right foot: Secondary | ICD-10-CM | POA: Diagnosis not present

## 2024-01-24 DIAGNOSIS — M216X2 Other acquired deformities of left foot: Secondary | ICD-10-CM | POA: Diagnosis not present

## 2024-01-24 NOTE — Progress Notes (Signed)
 Subjective:  Patient ID: Tracy Whitaker, female    DOB: 12/16/1980,  MRN: [id number]  Chief Complaint  Patient presents with   Arthritis    Pt stated that she has a lot of discomfort in her left big toe joint, she was told that she had arthritis in her foot     43 y.o. female presents with the above complaint.  Patient presents with left first metatarsophalangeal joint arthritis.  Patient states painful to touch is progressive gotten worse she already had a diagnosis of arthritis for the past.  Causing her discomfort she would like to discuss treatment plan for it.  She has not had a steroid injection denies any other acute complaints.  She would also like to discuss orthotics option as well.  She has not worn orthotics in the past.   Review of Systems: Negative except as noted in the HPI. Denies N/V/F/Ch.  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   Hydronephrosis, left 11/06/2016   Myalgia    Plantar fasciitis    Polyarthralgia 08/31/2017   Polycystic disease, ovaries    Pulmonary nodules 08/25/2017   right lung, repeat imaging in 1 year   Ulcerative colitis (HCC)     Current Outpatient Medications:    acyclovir (ZOVIRAX) 800 MG tablet, TAKE 1 TABLET(800 MG) BY MOUTH TWICE DAILY, Disp: 180 tablet, Rfl: 0   acyclovir ointment (ZOVIRAX) 5 %, Apply 1 application topically every 3 (three) hours., Disp: 30 g, Rfl: 3   amphetamine-dextroamphetamine (ADDERALL) 20 MG tablet, Take 1 tablet (20 mg total) by mouth in the morning, at noon, and at bedtime., Disp: 90 tablet, Rfl: 0   [START ON 02/03/2024] amphetamine-dextroamphetamine (ADDERALL) 20 MG tablet, Take 1 tablet (20 mg total) by mouth in  the morning, at noon, and at bedtime., Disp: 90 tablet, Rfl: 0   [START ON 02/03/2024] amphetamine-dextroamphetamine (ADDERALL) 20 MG tablet, Take 1 tablet (20 mg total) by mouth in the morning, at noon, and at bedtime., Disp: 90 tablet, Rfl: 0   ARIPiprazole (ABILIFY) 10 MG tablet, Take 10 mg twice a day, Disp: , Rfl:    clonazePAM (KLONOPIN) 0.5 MG tablet, Take 0.5 mg by mouth 3 (three) times daily as needed., Disp: , Rfl:    CRYSELLE-28 0.3-30 MG-MCG tablet, TAKE 1 TABLET BY MOUTH EVERY DAY, Disp: 84 tablet, Rfl: 0   DELZICOL 400 MG CPDR DR capsule, TAKE 2 CAPSULES(800 MG) BY MOUTH TWICE DAILY, Disp: 180 capsule, Rfl: 0   divalproex (DEPAKOTE) 250 MG DR tablet, Take 250 mg by mouth 2 (two) times daily., Disp: , Rfl:    DULoxetine (CYMBALTA) 20 MG capsule, Take 1 capsule (20 mg total) by mouth daily., Disp: 30 capsule, Rfl: 1   metFORMIN (GLUCOPHAGE) 500 MG tablet, TAKE 1 TABLET(500 MG) BY MOUTH TWICE DAILY WITH A MEAL, Disp: 180 tablet, Rfl: 1   nicotine (NICODERM CQ - DOSED IN MG/24 HOURS) 14 mg/24hr patch, RX #2 Weeks 5-6: 14 mg x 1 patch daily. Wear for 24 hours. If you have sleep disturbances, remove at bedtime., Disp: 14 patch, Rfl: 0   nicotine (NICODERM CQ - DOSED IN MG/24 HR) 7 mg/24hr patch, RX #3 Weeks 7-8: 7 mg  x 1 patch daily. Wear for 24 hours. If you have sleep disturbances, remove at bedtime., Disp: 14 patch, Rfl: 0   nicotine polacrilex (RA NICOTINE GUM) 2 MG gum, Take 1 each (2 mg total) by mouth as needed for smoking cessation., Disp: 100 tablet, Rfl: 0   omeprazole (PRILOSEC) 20 MG capsule, TAKE 1 CAPSULE(20 MG) BY MOUTH DAILY, Disp: 90 capsule, Rfl: 3   ondansetron (ZOFRAN-ODT) 4 MG disintegrating tablet, DISSOLVE 1 TABLET(4 MG) ON THE TONGUE EVERY 8 HOURS AS NEEDED, Disp: 40 tablet, Rfl: 0   pregabalin (LYRICA) 100 MG capsule, Take 100 mg by mouth in the morning and at bedtime., Disp: , Rfl:    Semaglutide-Weight Management 0.25 MG/0.5ML SOAJ, Inject 0.25 mg into the skin once a  week., Disp: 2 mL, Rfl: 0   [START ON 02/10/2024] Semaglutide-Weight Management 0.5 MG/0.5ML SOAJ, Inject 0.5 mg into the skin once a week., Disp: 2 mL, Rfl: 0   spironolactone (ALDACTONE) 100 MG tablet, TAKE 1 TABLET(100 MG) BY MOUTH DAILY, Disp: 90 tablet, Rfl: 0   traZODone (DESYREL) 100 MG tablet, Take 0.5-1 tablets (50-100 mg total) by mouth at bedtime., Disp: 90 tablet, Rfl: 3  Social History   Tobacco Use  Smoking Status Former   Current packs/day: 0.00   Average packs/day: 0.5 packs/day for 20.0 years (10.0 ttl pk-yrs)   Types: Cigarettes   Start date: 01/16/2001   Quit date: 01/16/2021   Years since quitting: 3.0  Smokeless Tobacco Never    Allergies  Allergen Reactions   Meloxicam Other (See Comments)    GI Complaints   Latex Dermatitis   Sulfamethoxazole-Trimethoprim Other (See Comments)    Joint pain   Objective:  There were no vitals filed for this visit. There is no height or weight on file to calculate BMI. Constitutional Well developed. Well nourished.  Vascular Dorsalis pedis pulses palpable bilaterally. Posterior tibial pulses palpable bilaterally. Capillary refill normal to all digits.  No cyanosis or clubbing noted. Pedal hair growth normal.  Neurologic Normal speech. Oriented to person, place, and time. Epicritic sensation to light touch grossly present bilaterally.  Dermatologic Nails well groomed and normal in appearance. No open wounds. No skin lesions.  Orthopedic: Pain on palpation left first metatarsophalangeal joint pain with range of motion of the joint pain at the end range of motion crepitus noted.  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 Arthritis of foot, left   2. Other acquired deformities of right foot   3. Other acquired deformities of left foot    Plan:  Patient was  evaluated and treated and all questions answered.  Left first metatarsophalangeal joint arthritis -All questions and concerns were discussed with the patient extensive detail given the amount of pain that she is having she will benefit from a steroid injection to help decrease inflammatory compensatory pain.  Patient agrees with plan like to proceed with steroid injection -A steroid injection was performed at left first MPJ using 1% plain Lidocaine and 10 mg of Kenalog. This was well tolerated.  Pes planovalgus/foot deformity -I explained to patient the etiology of pes planovalgus and relationship with Planter fasciitis and various treatment options were discussed.  Given patient foot structure in the setting of Planter fasciitis I believe patient will benefit from custom-made orthotics to help control the hindfoot motion support the arch of the foot  and take the stress away from plantar fascial.  Patient agrees with the plan like to proceed with orthotics -Patient was casted for orthotics    No follow-ups on file.

## 2024-01-26 DIAGNOSIS — F413 Other mixed anxiety disorders: Secondary | ICD-10-CM | POA: Diagnosis not present

## 2024-01-26 DIAGNOSIS — R208 Other disturbances of skin sensation: Secondary | ICD-10-CM | POA: Diagnosis not present

## 2024-01-26 DIAGNOSIS — M2550 Pain in unspecified joint: Secondary | ICD-10-CM | POA: Diagnosis not present

## 2024-01-26 DIAGNOSIS — R531 Weakness: Secondary | ICD-10-CM | POA: Diagnosis not present

## 2024-01-26 DIAGNOSIS — R200 Anesthesia of skin: Secondary | ICD-10-CM | POA: Diagnosis not present

## 2024-01-26 DIAGNOSIS — G479 Sleep disorder, unspecified: Secondary | ICD-10-CM | POA: Diagnosis not present

## 2024-01-26 DIAGNOSIS — M797 Fibromyalgia: Secondary | ICD-10-CM | POA: Diagnosis not present

## 2024-01-26 DIAGNOSIS — G629 Polyneuropathy, unspecified: Secondary | ICD-10-CM | POA: Diagnosis not present

## 2024-01-26 DIAGNOSIS — M545 Low back pain, unspecified: Secondary | ICD-10-CM | POA: Diagnosis not present

## 2024-01-26 DIAGNOSIS — R29898 Other symptoms and signs involving the musculoskeletal system: Secondary | ICD-10-CM | POA: Diagnosis not present

## 2024-01-26 DIAGNOSIS — R4189 Other symptoms and signs involving cognitive functions and awareness: Secondary | ICD-10-CM | POA: Diagnosis not present

## 2024-01-26 DIAGNOSIS — R251 Tremor, unspecified: Secondary | ICD-10-CM | POA: Diagnosis not present

## 2024-02-13 ENCOUNTER — Telehealth: Payer: Self-pay

## 2024-02-13 NOTE — Telephone Encounter (Signed)
 GIVING ORTHOS TO TAKE TO [facility] FOR 3/25

## 2024-02-15 ENCOUNTER — Other Ambulatory Visit: Payer: Self-pay | Admitting: Pediatrics

## 2024-02-15 DIAGNOSIS — F3132 Bipolar disorder, current episode depressed, moderate: Secondary | ICD-10-CM

## 2024-02-15 DIAGNOSIS — M797 Fibromyalgia: Secondary | ICD-10-CM

## 2024-02-16 NOTE — Telephone Encounter (Signed)
 Requested medications are due for refill today.  yes  Requested medications are on the active medications list.  yes  Last refill. 01/04/2024 #30 1 rf  Future visit scheduled.   yes  Notes to clinic.  New medication for this pt. Please review for refill.    Requested Prescriptions  Pending Prescriptions Disp Refills   DULoxetine (CYMBALTA) 20 MG capsule [Pharmacy Med Name: DULOXETINE DR 20MG  CAPSULES] 30 capsule 1    Sig: TAKE 1 CAPSULE(20 MG) BY MOUTH DAILY     There is no refill protocol information for this order

## 2024-02-28 ENCOUNTER — Other Ambulatory Visit

## 2024-03-06 ENCOUNTER — Other Ambulatory Visit: Payer: Self-pay | Admitting: Pediatrics

## 2024-03-06 DIAGNOSIS — E669 Obesity, unspecified: Secondary | ICD-10-CM

## 2024-03-06 DIAGNOSIS — M2550 Pain in unspecified joint: Secondary | ICD-10-CM

## 2024-03-06 DIAGNOSIS — M797 Fibromyalgia: Secondary | ICD-10-CM

## 2024-03-06 MED ORDER — SEMAGLUTIDE-WEIGHT MANAGEMENT 1 MG/0.5ML ~~LOC~~ SOAJ: 1.0000 mg | SUBCUTANEOUS | 0 refills | Status: DC

## 2024-03-06 NOTE — Progress Notes (Signed)
 Sent next dose wegovy  Hadassah Letters, MD

## 2024-03-14 ENCOUNTER — Other Ambulatory Visit: Payer: Self-pay | Admitting: Pediatrics

## 2024-03-14 DIAGNOSIS — M797 Fibromyalgia: Secondary | ICD-10-CM

## 2024-03-15 NOTE — Telephone Encounter (Signed)
 Requested medication (s) are due for refill today: yes  Requested medication (s) are on the active medication list: yes  Last refill:  10/04/23  Future visit scheduled: no  Notes to clinic:  Unable to refill per protocol, cannot delegate.      Requested Prescriptions  Pending Prescriptions Disp Refills   methocarbamol  (ROBAXIN ) 500 MG tablet [Pharmacy Med Name: METHOCARBAMOL  500MG  TABLETS] 60 tablet 2    Sig: TAKE 1 TABLET(500 MG) BY MOUTH TWICE DAILY AS NEEDED FOR MUSCLE SPASMS     Not Delegated - Analgesics:  Muscle Relaxants Failed - 03/15/2024 12:23 PM      Failed - This refill cannot be delegated      Passed - Valid encounter within last 6 months    Recent Outpatient Visits           2 months ago Fibromyalgia   [facility] Hadassah Letters, MD

## 2024-03-20 ENCOUNTER — Encounter: Payer: Self-pay | Admitting: Pediatrics

## 2024-03-20 ENCOUNTER — Ambulatory Visit: Payer: Medicaid Other | Admitting: Pediatrics

## 2024-03-20 VITALS — BP 112/77 | HR 89 | Temp 97.6°F | Wt 225.2 lb

## 2024-03-20 DIAGNOSIS — Z87891 Personal history of nicotine dependence: Secondary | ICD-10-CM | POA: Diagnosis not present

## 2024-03-20 DIAGNOSIS — M797 Fibromyalgia: Secondary | ICD-10-CM | POA: Diagnosis not present

## 2024-03-20 DIAGNOSIS — F17210 Nicotine dependence, cigarettes, uncomplicated: Secondary | ICD-10-CM

## 2024-03-20 DIAGNOSIS — E282 Polycystic ovarian syndrome: Secondary | ICD-10-CM | POA: Diagnosis not present

## 2024-03-20 DIAGNOSIS — Z1211 Encounter for screening for malignant neoplasm of colon: Secondary | ICD-10-CM

## 2024-03-20 DIAGNOSIS — F3132 Bipolar disorder, current episode depressed, moderate: Secondary | ICD-10-CM

## 2024-03-20 DIAGNOSIS — Z1330 Encounter for screening examination for mental health and behavioral disorders, unspecified: Secondary | ICD-10-CM | POA: Diagnosis not present

## 2024-03-20 DIAGNOSIS — E669 Obesity, unspecified: Secondary | ICD-10-CM | POA: Diagnosis not present

## 2024-03-20 DIAGNOSIS — K219 Gastro-esophageal reflux disease without esophagitis: Secondary | ICD-10-CM

## 2024-03-20 DIAGNOSIS — F909 Attention-deficit hyperactivity disorder, unspecified type: Secondary | ICD-10-CM | POA: Diagnosis not present

## 2024-03-20 MED ORDER — SPIRONOLACTONE 100 MG PO TABS: ORAL_TABLET | ORAL | 0 refills | Status: DC

## 2024-03-20 MED ORDER — AMPHETAMINE-DEXTROAMPHETAMINE 20 MG PO TABS: 20.0000 mg | ORAL_TABLET | Freq: Three times a day (TID) | ORAL | 0 refills | Status: DC

## 2024-03-20 MED ORDER — AMPHETAMINE-DEXTROAMPHETAMINE 20 MG PO TABS
20.0000 mg | ORAL_TABLET | Freq: Three times a day (TID) | ORAL | 0 refills | Status: DC
Start: 2024-05-19 — End: 2024-06-21

## 2024-03-20 MED ORDER — DULOXETINE HCL 20 MG PO CPEP
20.0000 mg | ORAL_CAPSULE | Freq: Every day | ORAL | 3 refills | Status: AC
Start: 2024-03-20 — End: ?

## 2024-03-20 MED ORDER — PREGABALIN 150 MG PO CAPS: 150.0000 mg | ORAL_CAPSULE | Freq: Two times a day (BID) | ORAL | 3 refills | Status: AC

## 2024-03-20 MED ORDER — WEGOVY 1.7 MG/0.75ML ~~LOC~~ SOAJ: 1.7000 mg | SUBCUTANEOUS | 0 refills | Status: DC

## 2024-03-20 NOTE — Progress Notes (Signed)
 Office Visit  BP 112/77   Pulse 89   Temp 97.6 F (36.4 C) (Oral)   Wt 225 lb 3.2 oz (102.2 kg)   LMP 02/28/2024 (Approximate)   SpO2 98%   BMI 38.66 kg/m    Subjective:    Patient ID: Tracy Whitaker, female    DOB: Apr 14, 1981, 43 y.o.   MRN: [id number]  HPI: Tracy Whitaker is a 42 y.o. female  Chief Complaint  Patient presents with   Fibromyalgia    Discussed the use of AI scribe software for clinical note transcription with the patient, who gave verbal consent to proceed.  History of Present Illness   Tracy Whitaker is a 43 year old female with fibromyalgia who presents for follow-up on her symptoms and medication management.  She has experienced significant improvement in her fibromyalgia symptoms since starting Lyrica  and discontinuing two antidepressants. She no longer uses a cane and sleeps well at night, describing this change as a '180' in her condition. She is currently taking Lyrica  100 mg twice a day.  She is experiencing side effects from Wegovy , including constipation, diarrhea, and sulfur burps, particularly after consuming greasy foods or red sauce. She is currently on omeprazole  20 mg and experiences these symptoms about once a week. She manages her diet to avoid triggers and is hesitant to take medications like Pepto due to the unpredictable nature of her symptoms.  She is working three days a week but is unable to work full-time due to her fibromyalgia. Her disability claim was denied after multiple appeals.  She has lost approximately six pounds since starting Wegovy , although she feels the weight loss is slow. She is currently on a 1 mg dose and is eating one meal a day with a Tracy Whitaker for protein at lunch. She avoids carbs and reports feeling fuller with smaller portions.  Her current medications include Adderall three times a day, Cymbalta  once a day at night, and Klonopin three times a day. She recently refilled her Adderall prescription and  reports it helps with brain fog. She stopped Effexor  and Depakote when she started Cymbalta .  She has a history of ulcerative colitis and is due for a colon cancer screening. She has had multiple screenings in the past and is apprehensive about the preparation process.  She has quit vaping, transitioning from a zero nicotine  vape to complete cessation, which she believes has positively impacted her health.      Relevant past medical, surgical, family and social history reviewed and updated as indicated. Interim medical history since our last visit reviewed. Allergies and medications reviewed and updated.  ROS per HPI unless specifically indicated above     Objective:    BP 112/77   Pulse 89   Temp 97.6 F (36.4 C) (Oral)   Wt 225 lb 3.2 oz (102.2 kg)   LMP 02/28/2024 (Approximate)   SpO2 98%   BMI 38.66 kg/m   Wt Readings from Last 3 Encounters:  03/20/24 225 lb 3.2 oz (102.2 kg)  01/04/24 231 lb (104.8 kg)  08/25/23 234 lb (106.1 kg)     Physical Exam Constitutional:      Appearance: Normal appearance.  HENT:     Head: Normocephalic and atraumatic.  Eyes:     Pupils: Pupils are equal, round, and reactive to light.  Cardiovascular:     Rate and Rhythm: Normal rate and regular rhythm.     Pulses: Normal pulses.     Heart sounds: Normal heart  sounds.  Pulmonary:     Effort: Pulmonary effort is normal.     Breath sounds: Normal breath sounds.  Musculoskeletal:        General: Normal range of motion.     Cervical back: Normal range of motion.  Skin:    General: Skin is warm and dry.     Capillary Refill: Capillary refill takes less than 2 seconds.  Neurological:     General: No focal deficit present.     Mental Status: She is alert. Mental status is at baseline.  Psychiatric:        Mood and Affect: Mood normal.        Behavior: Behavior normal.         03/20/2024    9:59 AM 01/04/2024   11:27 AM 09/08/2023    3:44 PM 08/25/2023    3:07 PM 05/23/2023   10:39 AM   Depression screen PHQ 2/9  Decreased Interest 0 0 0 0 0  Down, Depressed, Hopeless 0  0 0 0  PHQ - 2 Score 0 0 0 0 0  Altered sleeping 0 0 0 2 1  Tired, decreased energy 0 3 0 2 3  Change in appetite 0 3 0 2 3  Feeling bad or failure about yourself  0 0 0 1 0  Trouble concentrating 0 0 0 3 0  Moving slowly or fidgety/restless 0 0 0 0 0  Suicidal thoughts 0 0 0 0 0  PHQ-9 Score 0 6 0 10 7  Difficult doing work/chores Not difficult at all  Not difficult at all Somewhat difficult Not difficult at all       03/20/2024    9:59 AM 01/04/2024   11:26 AM 09/08/2023    3:44 PM 08/25/2023    3:07 PM  GAD 7 : Generalized Anxiety Score  Nervous, Anxious, on Edge 0 0 0 0  Control/stop worrying 0 0 0 2  Worry too much - different things 0 0 0 2  Trouble relaxing 0 0 0 2  Restless 0 0 0 0  Easily annoyed or irritable 0 0 0 0  Afraid - awful might happen 0 0 0 0  Total GAD 7 Score 0 0 0 6  Anxiety Difficulty Not difficult at all  Not difficult at all Somewhat difficult       Assessment & Plan:  Assessment & Plan   Polycystic disease, ovaries Assessment & Plan: Spironolactone  prescription denied. Used for managing excess hair growth. - Resend spironolactone  prescription to pharmacy.  Orders: -     Spironolactone ; TAKE 1 TABLET(100 MG) BY MOUTH DAILY  Dispense: 90 tablet; Refill: 0  Obesity (BMI 30-39.9) Assessment & Plan: On Wegovy  1 mg with 6-9 lbs weight loss. Side effects include constipation, diarrhea, sulfur burps. Discussed increasing Wegovy  dose for enhanced weight loss and potential switch to Zepbound if ineffective. - Increase Wegovy  dose to 1.7 mg, then 2.4 mg as tolerated. - Monitor for side effects and effectiveness. - Consider Zepbound if Wegovy  is ineffective at maximum dose. - Continue dietary modifications to avoid trigger foods.  Orders: -     Wegovy ; Inject 1.7 mg into the skin once a week.  Dispense: 3 mL; Refill: 0  Attention deficit hyperactivity disorder  (ADHD), unspecified ADHD type Assessment & Plan: Adderall 20mg  TID chronic med. Unable to tolerate wean previously.   Orders: -     Amphetamine -Dextroamphetamine ; Take 1 tablet (20 mg total) by mouth in the morning, at noon, and at bedtime.  Dispense: 90 tablet; Refill: 0 -     Amphetamine -Dextroamphetamine ; Take 1 tablet (20 mg total) by mouth in the morning, at noon, and at bedtime.  Dispense: 90 tablet; Refill: 0 -     Amphetamine -Dextroamphetamine ; Take 1 tablet (20 mg total) by mouth in the morning, at noon, and at bedtime.  Dispense: 90 tablet; Refill: 0  Fibromyalgia Assessment & Plan: Significant symptom improvement with Lyrica  and Cymbalta . No longer using a cane, sleeping well. Considering Lyrica  dose increase. - Increase Lyrica  dose to 150 mg twice daily. - Follow up with neurologist regarding Lyrica  dosage.  Orders: -     Pregabalin ; Take 1 capsule (150 mg total) by mouth 2 (two) times daily.  Dispense: 60 capsule; Refill: 3 -     DULoxetine  HCl; Take 1 capsule (20 mg total) by mouth daily.  Dispense: 90 capsule; Refill: 3  Former smoker Assessment & Plan: Successfully quit vaping, including zero nicotine  vapes. Positive impact on health and inflammation.    Gastroesophageal reflux disease, unspecified whether esophagitis present Assessment & Plan: Experiencing sulfur burps after greasy or red sauce foods. Currently on omeprazole  20 mg. Discussed increasing dose for better symptom control. - Increase omeprazole  dose to 40 mg daily, single or split dose.   Screen for colon cancer Due for colon cancer screening. Discussed improved colonoscopy prep methods. - Encourage scheduling of colon cancer screening. - Provide referral for colonoscopy once pt ready.   Encounter for behavioral health screening As part of their intake evaluation, the patient was screened for depression, anxiety.  PHQ9 SCORE 0, GAD7 SCORE 0. Screening results negative for tested conditions. See plan  under problem/diagnosis above.   Follow up plan: Return in about 3 months (around 06/19/2024) for (virtual), Chronic illness f/u, ADHD.  Hadassah Letters, MD

## 2024-03-20 NOTE — Patient Instructions (Addendum)
 Increase omeprazole  20mg  take twice daily  Lyrica 150mg  twice daily. If you like that higher dose, let me know.   I sent refills of spironolactone  100mg  daily.

## 2024-03-22 ENCOUNTER — Encounter: Payer: Self-pay | Admitting: Pediatrics

## 2024-03-22 DIAGNOSIS — K219 Gastro-esophageal reflux disease without esophagitis: Secondary | ICD-10-CM | POA: Insufficient documentation

## 2024-03-22 NOTE — Assessment & Plan Note (Signed)
 On Wegovy  1 mg with 6-9 lbs weight loss. Side effects include constipation, diarrhea, sulfur burps. Discussed increasing Wegovy  dose for enhanced weight loss and potential switch to Zepbound if ineffective. - Increase Wegovy  dose to 1.7 mg, then 2.4 mg as tolerated. - Monitor for side effects and effectiveness. - Consider Zepbound if Wegovy  is ineffective at maximum dose. - Continue dietary modifications to avoid trigger foods.

## 2024-03-22 NOTE — Assessment & Plan Note (Signed)
 Successfully quit vaping, including zero nicotine  vapes. Positive impact on health and inflammation.

## 2024-03-22 NOTE — Assessment & Plan Note (Signed)
 Spironolactone  prescription denied. Used for managing excess hair growth. - Resend spironolactone  prescription to pharmacy.

## 2024-03-22 NOTE — Assessment & Plan Note (Signed)
 Experiencing sulfur burps after greasy or red sauce foods. Currently on omeprazole  20 mg. Discussed increasing dose for better symptom control. - Increase omeprazole  dose to 40 mg daily, single or split dose.

## 2024-03-22 NOTE — Assessment & Plan Note (Signed)
 Adderall 20mg  TID chronic med. Unable to tolerate wean previously.

## 2024-03-22 NOTE — Assessment & Plan Note (Signed)
 Significant symptom improvement with Lyrica  and Cymbalta . No longer using a cane, sleeping well. Considering Lyrica  dose increase. - Increase Lyrica  dose to 150 mg twice daily. - Follow up with neurologist regarding Lyrica  dosage.

## 2024-04-08 ENCOUNTER — Other Ambulatory Visit: Payer: Self-pay | Admitting: Pediatrics

## 2024-04-11 NOTE — Telephone Encounter (Signed)
 Requested medication (s) are due for refill today: yes   Requested medication (s) are on the active medication list: yes   Last refill:  01/19/24 #84 0 refills  Future visit scheduled: no   Notes to clinic:  no refills remain. Do you want to refill Rx?     Requested Prescriptions  Pending Prescriptions Disp Refills   CRYSELLE -28 0.3-30 MG-MCG tablet [Pharmacy Med Name: CRYSELLE  TABLETS 28S] 84 tablet 0    Sig: TAKE 1 TABLET BY MOUTH EVERY DAY     OB/GYN:  Contraceptives Passed - 04/11/2024  8:05 AM      Passed - Last BP in normal range    BP Readings from Last 1 Encounters:  03/20/24 112/77         Passed - Valid encounter within last 12 months    Recent Outpatient Visits           3 weeks ago Polycystic disease, ovaries   [place] [facility] Hadassah Letters, MD   3 months ago Fibromyalgia   [facility] Hadassah Letters, MD              Passed - Patient is not a smoker

## 2024-04-23 ENCOUNTER — Other Ambulatory Visit: Payer: Self-pay | Admitting: Nurse Practitioner

## 2024-04-24 NOTE — Telephone Encounter (Signed)
 Requested Prescriptions  Pending Prescriptions Disp Refills   omeprazole  (PRILOSEC) 20 MG capsule [Pharmacy Med Name: OMEPRAZOLE  20MG  CAPSULES] 90 capsule 3    Sig: TAKE 1 CAPSULE(20 MG) BY MOUTH DAILY     Gastroenterology: Proton Pump Inhibitors Passed - 04/24/2024  1:44 PM      Passed - Valid encounter within last 12 months    Recent Outpatient Visits           1 month ago Polycystic disease, ovaries   [facility] Hadassah Letters, MD   3 months ago Fibromyalgia   [place] [facility] Hadassah Letters, MD

## 2024-05-04 ENCOUNTER — Other Ambulatory Visit: Payer: Self-pay | Admitting: Pediatrics

## 2024-05-04 DIAGNOSIS — E669 Obesity, unspecified: Secondary | ICD-10-CM

## 2024-05-07 NOTE — Telephone Encounter (Signed)
 Requested Prescriptions  Pending Prescriptions Disp Refills   WEGOVY  1.7 MG/0.75ML SOAJ [Pharmacy Med Name: WEGOVY  1.7MG /0.75ML INJ ( 4 PENS)] 3 mL 0    Sig: INJECT 1.7 MG UNDER THE SKIN ONE DAY A WEEK.     Endocrinology:  Diabetes - GLP-1 Receptor Agonists - semaglutide  Failed - 05/07/2024  2:15 PM      Failed - HBA1C in normal range and within 180 days    HB A1C (BAYER DCA - WAIVED)  Date Value Ref Range Status  10/28/2020 5.4 <7.0 % Final    Comment:                                          Diabetic Adult            <7.0                                       Healthy Adult        4.3 - 5.7                                                           (DCCT/NGSP) American Diabetes Association's Summary of Glycemic Recommendations for Adults with Diabetes: Hemoglobin A1c <7.0%. More stringent glycemic goals (A1c <6.0%) may further reduce complications at the cost of increased risk of hypoglycemia.    Hgb A1c MFr Bld  Date Value Ref Range Status  08/25/2023 5.0 4.8 - 5.6 % Final    Comment:             Prediabetes: 5.7 - 6.4          Diabetes: >6.4          Glycemic control for adults with diabetes: <7.0          Passed - Cr in normal range and within 360 days    Creatinine  Date Value Ref Range Status  02/28/2023 22.0 20.0 - 300.0 mg/dL Final   Creatinine, Ser  Date Value Ref Range Status  08/25/2023 0.91 0.57 - 1.00 mg/dL Final         Passed - Valid encounter within last 6 months    Recent Outpatient Visits           1 month ago Polycystic disease, ovaries   [place] [facility] Hadassah Letters, MD   4 months ago Fibromyalgia   [place] [facility] Hadassah Letters, MD

## 2024-05-11 ENCOUNTER — Other Ambulatory Visit: Payer: Self-pay | Admitting: Pediatrics

## 2024-05-11 DIAGNOSIS — E669 Obesity, unspecified: Secondary | ICD-10-CM

## 2024-05-18 MED ORDER — WEGOVY 2.4 MG/0.75ML ~~LOC~~ SOAJ: 2.4000 mg | SUBCUTANEOUS | 6 refills | Status: DC

## 2024-05-18 NOTE — Progress Notes (Signed)
 Sent higher dose wegovy  2.4mg .   Hadassah [name], MD

## 2024-06-14 ENCOUNTER — Other Ambulatory Visit: Payer: Self-pay | Admitting: Pediatrics

## 2024-06-14 DIAGNOSIS — K219 Gastro-esophageal reflux disease without esophagitis: Secondary | ICD-10-CM

## 2024-06-14 MED ORDER — OMEPRAZOLE 40 MG PO CPDR: 40.0000 mg | DELAYED_RELEASE_CAPSULE | Freq: Every day | ORAL | 3 refills | Status: AC

## 2024-06-14 NOTE — Progress Notes (Signed)
 Updated ppi dose  Hadassah [name], MD

## 2024-06-15 ENCOUNTER — Other Ambulatory Visit: Payer: Self-pay | Admitting: Pediatrics

## 2024-06-15 DIAGNOSIS — E282 Polycystic ovarian syndrome: Secondary | ICD-10-CM

## 2024-06-17 ENCOUNTER — Other Ambulatory Visit: Payer: Self-pay | Admitting: Family Medicine

## 2024-06-17 DIAGNOSIS — M797 Fibromyalgia: Secondary | ICD-10-CM

## 2024-06-18 NOTE — Telephone Encounter (Signed)
 Requested Prescriptions  Pending Prescriptions Disp Refills   spironolactone  (ALDACTONE ) 100 MG tablet [Pharmacy Med Name: SPIRONOLACTONE  100MG  TABLETS] 90 tablet 0    Sig: TAKE 1 TABLET(100 MG) BY MOUTH DAILY     Cardiovascular: Diuretics - Aldosterone Antagonist Failed - 06/18/2024 12:15 PM      Failed - Cr in normal range and within 180 days    Creatinine  Date Value Ref Range Status  02/28/2023 22.0 20.0 - 300.0 mg/dL Final   Creatinine, Ser  Date Value Ref Range Status  08/25/2023 0.91 0.57 - 1.00 mg/dL Final         Failed - K in normal range and within 180 days    Potassium  Date Value Ref Range Status  08/25/2023 4.1 3.5 - 5.2 mmol/L Final         Failed - Na in normal range and within 180 days    Sodium  Date Value Ref Range Status  08/25/2023 141 134 - 144 mmol/L Final         Failed - eGFR is 30 or above and within 180 days    GFR calc Af Amer  Date Value Ref Range Status  10/28/2020 88 >59 mL/min/1.73 Final    Comment:    **In accordance with recommendations from the NKF-ASN Task force,**   [facility] is in the process of updating its eGFR calculation to the   2021 CKD-EPI creatinine equation that estimates kidney function   without a race variable.    GFR calc non Af Amer  Date Value Ref Range Status  10/28/2020 77 >59 mL/min/1.73 Final   eGFR  Date Value Ref Range Status  08/25/2023 81 >59 mL/min/1.73 Final         Passed - Last BP in normal range    BP Readings from Last 1 Encounters:  03/20/24 112/77         Passed - Valid encounter within last 6 months    Recent Outpatient Visits           3 months ago Polycystic disease, ovaries   [place] [facility] Hadassah SQUIBB, MD   5 months ago Fibromyalgia   [place] [facility] Hadassah SQUIBB, MD

## 2024-06-19 ENCOUNTER — Other Ambulatory Visit: Payer: Self-pay | Admitting: Pediatrics

## 2024-06-19 DIAGNOSIS — M797 Fibromyalgia: Secondary | ICD-10-CM

## 2024-06-19 NOTE — Telephone Encounter (Unsigned)
 Copied from CRM #[id number]. Topic: Clinical - Medication Refill >> Jun 19, 2024  9:33 AM Avram MATSU wrote: Medication: methocarbamol  (ROBAXIN ) 500 MG tablet [[id number]]  Has the patient contacted their pharmacy? Yes (Agent: If no, request that the patient contact the pharmacy for the refill. If patient does not wish to contact the pharmacy document the reason why and proceed with request.) (Agent: If yes, when and what did the pharmacy advise?)  This is the patient's preferred pharmacy:  [id number] [place], [place] - [address] AT [facility] [address] [place] [address] Phone: [phone] Fax: [phone]  Is this the correct pharmacy for this prescription? Yes If no, delete pharmacy and type the correct one.   Has the prescription been filled recently? No  Is the patient out of the medication? Yes  Has the patient been seen for an appointment in the last year OR does the patient have an upcoming appointment? Yes  Can we respond through MyChart? Yes  Agent: Please be advised that Rx refills may take up to 3 business days. We ask that you follow-up with your pharmacy.

## 2024-06-19 NOTE — Telephone Encounter (Signed)
 Requested medications are due for refill today.  yes  Requested medications are on the active medications list.  yes  Last refill. 03/15/2024 #60 0 rf  Future visit scheduled.   yes  Notes to clinic.  Refill not delegated.    Requested Prescriptions  Pending Prescriptions Disp Refills   methocarbamol  (ROBAXIN ) 500 MG tablet [Pharmacy Med Name: METHOCARBAMOL  500MG  TABLETS] 60 tablet 0    Sig: TAKE 1 TABLET(500 MG) BY MOUTH TWICE DAILY AS NEEDED FOR MUSCLE SPASMS     Not Delegated - Analgesics:  Muscle Relaxants Failed - 06/19/2024  8:50 AM      Failed - This refill cannot be delegated      Passed - Valid encounter within last 6 months    Recent Outpatient Visits           3 months ago Polycystic disease, ovaries   [facility] Herold Hadassah SQUIBB, MD   5 months ago Fibromyalgia   [place] [facility] Herold Hadassah SQUIBB, MD

## 2024-06-20 NOTE — Telephone Encounter (Signed)
 Requested medication (s) are due for refill today: yes  Requested medication (s) are on the active medication list: yes  Last refill:  03/15/24  Future visit scheduled: yes  Notes to clinic:  Unable to refill per protocol, cannot delegate.      Requested Prescriptions  Pending Prescriptions Disp Refills   methocarbamol  (ROBAXIN ) 500 MG tablet 60 tablet 0     Not Delegated - Analgesics:  Muscle Relaxants Failed - 06/20/2024 11:22 AM      Failed - This refill cannot be delegated      Passed - Valid encounter within last 6 months    Recent Outpatient Visits           3 months ago Polycystic disease, ovaries   [facility] [facility] Herold Hadassah SQUIBB, MD   5 months ago Fibromyalgia    [facility] Herold Hadassah SQUIBB, MD

## 2024-06-21 ENCOUNTER — Ambulatory Visit: Admitting: Pediatrics

## 2024-06-21 ENCOUNTER — Telehealth: Payer: Self-pay

## 2024-06-21 ENCOUNTER — Encounter: Payer: Self-pay | Admitting: Pediatrics

## 2024-06-21 VITALS — BP 103/71 | HR 80 | Temp 97.7°F | Wt 210.4 lb

## 2024-06-21 DIAGNOSIS — E669 Obesity, unspecified: Secondary | ICD-10-CM | POA: Diagnosis not present

## 2024-06-21 DIAGNOSIS — K635 Polyp of colon: Secondary | ICD-10-CM | POA: Diagnosis not present

## 2024-06-21 DIAGNOSIS — Z1231 Encounter for screening mammogram for malignant neoplasm of breast: Secondary | ICD-10-CM | POA: Diagnosis not present

## 2024-06-21 DIAGNOSIS — K219 Gastro-esophageal reflux disease without esophagitis: Secondary | ICD-10-CM

## 2024-06-21 DIAGNOSIS — R112 Nausea with vomiting, unspecified: Secondary | ICD-10-CM | POA: Diagnosis not present

## 2024-06-21 DIAGNOSIS — F909 Attention-deficit hyperactivity disorder, unspecified type: Secondary | ICD-10-CM

## 2024-06-21 DIAGNOSIS — M797 Fibromyalgia: Secondary | ICD-10-CM

## 2024-06-21 MED ORDER — METHOCARBAMOL 1000 MG PO TABS: 1000.0000 mg | ORAL_TABLET | Freq: Every day | ORAL | 3 refills | Status: AC

## 2024-06-21 MED ORDER — ONDANSETRON 4 MG PO TBDP
4.0000 mg | ORAL_TABLET | Freq: Three times a day (TID) | ORAL | 0 refills | Status: DC | PRN
Start: 2024-06-21 — End: 2024-09-20

## 2024-06-21 MED ORDER — AMPHETAMINE-DEXTROAMPHETAMINE 20 MG PO TABS: 20.0000 mg | ORAL_TABLET | Freq: Three times a day (TID) | ORAL | 0 refills | Status: DC

## 2024-06-21 MED ORDER — ARIPIPRAZOLE 20 MG PO TABS: 20.0000 mg | ORAL_TABLET | Freq: Every day | ORAL | 1 refills | Status: AC

## 2024-06-21 NOTE — Progress Notes (Signed)
 Office Visit  BP 103/71   Pulse 80   Temp 97.7 F (36.5 C) (Oral)   Wt 210 lb 6.4 oz (95.4 kg)   LMP 06/04/2024 (Approximate)   SpO2 99%   BMI 36.12 kg/m    Subjective:    Patient ID: Tracy Whitaker, female    DOB: Nov 25, 1980, 43 y.o.   MRN: [id number]  HPI: Tracy Whitaker is a 43 y.o. female  Chief Complaint  Patient presents with   ADHD    Discussed the use of AI scribe software for clinical note transcription with the patient, who gave verbal consent to proceed.  History of Present Illness   Tracy Whitaker is a 43 year old female who presents with nausea related to Wegovy  use.  She has been experiencing significant nausea since starting the 2.4 mg dose of Wegovy , which she has been on for two weeks. The nausea is most pronounced the day after her injection, causing discomfort and a sensation of swollen glands and impending vomiting. She previously experienced similar symptoms on the 1.7 mg dose. She does not currently have Zofran  to manage the nausea.  She reports occasional acid reflux, for which she takes Tums and omeprazole  40 mg at night. She reports taking omeprazole  40 mg at night, which she feels helps her symptoms. She also experiences diarrhea at times and manages it with antidiarrheal medication.  She has been taking Robaxin , 500 mg, two tablets at night, but has run out of her supply. She has been spacing out her doses to avoid running out completely. She is also on Adderall, which she reports is going well, but she is currently out of it.  She is on Abilify  20 mg, prescribed by her neurologist, but she has been unable to see this medication on her [facility] app and needs a refill. Missing this medication significantly affects her well-being.  She has lost 40 pounds and reports feeling good with increased energy. She is working more hours at her job, now up to seven to seven and a half hours a day, three days a week, and is managing well. She aims to work  four days a week for financial reasons.  She has a family history of breast cancer, as her mother had the condition. She is smoke-free and is proud of her progress, noting that her daughter is also proud of her efforts.      Relevant past medical, surgical, family and social history reviewed and updated as indicated. Interim medical history since our last visit reviewed. Allergies and medications reviewed and updated.  ROS per HPI unless specifically indicated above     Objective:    BP 103/71   Pulse 80   Temp 97.7 F (36.5 C) (Oral)   Wt 210 lb 6.4 oz (95.4 kg)   LMP 06/04/2024 (Approximate)   SpO2 99%   BMI 36.12 kg/m   Wt Readings from Last 3 Encounters:  06/21/24 210 lb 6.4 oz (95.4 kg)  03/20/24 225 lb 3.2 oz (102.2 kg)  01/04/24 231 lb (104.8 kg)     Physical Exam Constitutional:      Appearance: Normal appearance.  Pulmonary:     Effort: Pulmonary effort is normal.  Musculoskeletal:        General: Normal range of motion.  Skin:    Comments: Normal skin color  Neurological:     General: No focal deficit present.     Mental Status: She is alert. Mental status is at baseline.  Psychiatric:        Mood and Affect: Mood normal.        Behavior: Behavior normal.        Thought Content: Thought content normal.         06/21/2024   10:46 AM 03/20/2024    9:59 AM 01/04/2024   11:27 AM 09/08/2023    3:44 PM 08/25/2023    3:07 PM  Depression screen PHQ 2/9  Decreased Interest 0 0 0 0 0  Down, Depressed, Hopeless 0 0  0 0  PHQ - 2 Score 0 0 0 0 0  Altered sleeping 1 0 0 0 2  Tired, decreased energy 0 0 3 0 2  Change in appetite 0 0 3 0 2  Feeling bad or failure about yourself  0 0 0 0 1  Trouble concentrating 0 0 0 0 3  Moving slowly or fidgety/restless 0 0 0 0 0  Suicidal thoughts 0 0 0 0 0  PHQ-9 Score 1 0 6 0 10  Difficult doing work/chores Not difficult at all Not difficult at all  Not difficult at all Somewhat difficult       06/21/2024   10:46 AM  03/20/2024    9:59 AM 01/04/2024   11:26 AM 09/08/2023    3:44 PM  GAD 7 : Generalized Anxiety Score  Nervous, Anxious, on Edge 0 0 0 0  Control/stop worrying 0 0 0 0  Worry too much - different things 0 0 0 0  Trouble relaxing 1 0 0 0  Restless 0 0 0 0  Easily annoyed or irritable 0 0 0 0  Afraid - awful might happen 0 0 0 0  Total GAD 7 Score 1 0 0 0  Anxiety Difficulty Not difficult at all Not difficult at all  Not difficult at all       Assessment & Plan:  Assessment & Plan   Attention deficit hyperactivity disorder (ADHD), unspecified ADHD type Assessment & Plan: Adderall is effective for managing ADHD symptoms. Prescribe Adderall with leeway on the next fill to avoid running out between visits.  Orders: -     Amphetamine -Dextroamphetamine ; Take 1 tablet (20 mg total) by mouth in the morning, at noon, and at bedtime.  Dispense: 90 tablet; Refill: 0  Obesity (BMI 30-39.9) Nausea and vomiting, unspecified vomiting type Assessment & Plan: Weight loss totals a 40-pound loss. She prefers to continue the current dose despite side effects, which are expected to improve. Consider switching to Zepbound if nausea persists. Prescribe Zofran  for nausea management as needed. Continue Wegovy  2.4 mg. Monitor weight loss and nausea; consider switching to Zepbound if nausea persists. Allow adjustment of injection schedule to manage nausea on non-work days. -     Ondansetron ; Take 1 tablet (4 mg total) by mouth every 8 (eight) hours as needed for nausea or vomiting.  Dispense: 12 tablet; Refill: 0  Fibromyalgia Assessment & Plan: She manages muscle spasms with Robaxin , spacing doses due to a prescription issue. Open to switching to a 1000 mg tablet to reduce pill burden. Prescribe Robaxin  1000 mg tablet for nightly use.  Orders: -     Methocarbamol ; Take 1,000 mg by mouth at bedtime.  Dispense: 30 tablet; Refill: 3 -     ARIPiprazole ; Take 1 tablet (20 mg total) by mouth daily.  Dispense: 30  tablet; Refill: 1  Polyp of colon, unspecified part of colon, unspecified type Overdue for colonoscopy. -     Ambulatory referral to Gastroenterology  Encounter for screening mammogram for malignant neoplasm of breast -     3D Screening Mammogram, Left and Right; Future  Gastroesophageal reflux disease, unspecified whether esophagitis present Assessment & Plan: Acid reflux is managed with omeprazole  and Tums. Increase omeprazole  to twice daily if symptoms worsen, especially with Wegovy  use. Continue omeprazole  40 mg at night. Increase to twice daily if GERD symptoms worsen.   Follow up plan: Return in about 3 months (around 09/21/2024) for wt management, ADHD, (virtual).  Hadassah [name], MD

## 2024-06-21 NOTE — Telephone Encounter (Signed)
 LVM to schedule orthotic fitting/ pu

## 2024-06-21 NOTE — Telephone Encounter (Signed)
Already filled, duplicate order

## 2024-06-21 NOTE — Patient Instructions (Signed)
 You have an order for:  []   2D Mammogram  [x]   3D Mammogram  []   Bone Density     Please call for appointment:  [facility]  [address] [place] [address] [phone] [facility] [address] [place], [place] [address] [phone] [facility] [address] Geanie Logan [place], [place] [address] [phone]   Make sure to wear two-piece clothing.  No lotions, powders, or deodorants the day of the appointment. Make sure to bring picture ID and insurance card.  Bring list of medications you are currently taking including any supplements.   Schedule your [facility] screening mammogram through MyChart!   Log into your MyChart account.  Go to 'Visit' (or 'Appointments' if on mobile App) --> Schedule an Appointment  Under 'Select a Reason for Visit' choose the Mammogram Screening option.  Complete the pre-visit questions and select the time and place that best fits your schedule.

## 2024-06-27 ENCOUNTER — Encounter: Payer: Self-pay | Admitting: Pediatrics

## 2024-06-27 MED ORDER — AMPHETAMINE-DEXTROAMPHETAMINE 20 MG PO TABS: 20.0000 mg | ORAL_TABLET | Freq: Three times a day (TID) | ORAL | 0 refills | Status: AC

## 2024-06-27 MED ORDER — AMPHETAMINE-DEXTROAMPHETAMINE 20 MG PO TABS: 20.0000 mg | ORAL_TABLET | Freq: Three times a day (TID) | ORAL | 0 refills | Status: DC

## 2024-06-27 NOTE — Assessment & Plan Note (Signed)
 Weight loss totals a 40-pound loss. She prefers to continue the current dose despite side effects, which are expected to improve. Consider switching to Zepbound if nausea persists. Prescribe Zofran  for nausea management as needed. Continue Wegovy  2.4 mg. Monitor weight loss and nausea; consider switching to Zepbound if nausea persists. Allow adjustment of injection schedule to manage nausea on non-work days.

## 2024-06-27 NOTE — Assessment & Plan Note (Signed)
 Acid reflux is managed with omeprazole  and Tums. Increase omeprazole  to twice daily if symptoms worsen, especially with Wegovy  use. Continue omeprazole  40 mg at night. Increase to twice daily if GERD symptoms worsen.

## 2024-06-27 NOTE — Assessment & Plan Note (Signed)
 Adderall is effective for managing ADHD symptoms. Prescribe Adderall with leeway on the next fill to avoid running out between visits.

## 2024-06-27 NOTE — Assessment & Plan Note (Signed)
 She manages muscle spasms with Robaxin , spacing doses due to a prescription issue. Open to switching to a 1000 mg tablet to reduce pill burden. Prescribe Robaxin  1000 mg tablet for nightly use.

## 2024-06-30 ENCOUNTER — Other Ambulatory Visit: Payer: Self-pay | Admitting: Pediatrics

## 2024-07-04 NOTE — Telephone Encounter (Signed)
 Requested Prescriptions  Pending Prescriptions Disp Refills   CRYSELLE -28 0.3-30 MG-MCG tablet [Pharmacy Med Name: CRYSELLE  TABLETS 28S] 84 tablet 0    Sig: TAKE 1 TABLET BY MOUTH EVERY DAY     OB/GYN:  Contraceptives Passed - 07/04/2024 10:45 AM      Passed - Last BP in normal range    BP Readings from Last 1 Encounters:  06/21/24 103/71         Passed - Valid encounter within last 12 months    Recent Outpatient Visits           1 week ago Attention deficit hyperactivity disorder (ADHD), unspecified ADHD type   [facility] Herold Hadassah SQUIBB, MD   3 months ago Polycystic disease, ovaries   [place] Herold Hadassah SQUIBB, MD   6 months ago Fibromyalgia   [place] [facility] Herold Hadassah SQUIBB, MD              Passed - Patient is not a smoker

## 2024-07-11 ENCOUNTER — Other Ambulatory Visit: Payer: Self-pay | Admitting: Pediatrics

## 2024-07-11 DIAGNOSIS — E669 Obesity, unspecified: Secondary | ICD-10-CM

## 2024-07-11 MED ORDER — TIRZEPATIDE-WEIGHT MANAGEMENT 2.5 MG/0.5ML ~~LOC~~ SOAJ: 2.5000 mg | SUBCUTANEOUS | 0 refills | Status: DC

## 2024-07-11 NOTE — Progress Notes (Signed)
 Switching to zepbound  due to GI side effects and lack of weight loss plateau seen with wegovy .  Tracy [name], MD

## 2024-07-12 ENCOUNTER — Telehealth: Payer: Self-pay

## 2024-07-12 ENCOUNTER — Other Ambulatory Visit (HOSPITAL_COMMUNITY): Payer: Self-pay

## 2024-07-12 DIAGNOSIS — R200 Anesthesia of skin: Secondary | ICD-10-CM | POA: Diagnosis not present

## 2024-07-12 DIAGNOSIS — M797 Fibromyalgia: Secondary | ICD-10-CM | POA: Diagnosis not present

## 2024-07-12 DIAGNOSIS — L299 Pruritus, unspecified: Secondary | ICD-10-CM | POA: Diagnosis not present

## 2024-07-12 DIAGNOSIS — R609 Edema, unspecified: Secondary | ICD-10-CM | POA: Diagnosis not present

## 2024-07-12 DIAGNOSIS — R251 Tremor, unspecified: Secondary | ICD-10-CM | POA: Diagnosis not present

## 2024-07-12 DIAGNOSIS — R208 Other disturbances of skin sensation: Secondary | ICD-10-CM | POA: Diagnosis not present

## 2024-07-12 DIAGNOSIS — R4189 Other symptoms and signs involving cognitive functions and awareness: Secondary | ICD-10-CM | POA: Diagnosis not present

## 2024-07-12 DIAGNOSIS — R531 Weakness: Secondary | ICD-10-CM | POA: Diagnosis not present

## 2024-07-12 DIAGNOSIS — G479 Sleep disorder, unspecified: Secondary | ICD-10-CM | POA: Diagnosis not present

## 2024-07-12 DIAGNOSIS — F413 Other mixed anxiety disorders: Secondary | ICD-10-CM | POA: Diagnosis not present

## 2024-07-12 DIAGNOSIS — R202 Paresthesia of skin: Secondary | ICD-10-CM | POA: Diagnosis not present

## 2024-07-12 NOTE — Telephone Encounter (Signed)
 Pharmacy Patient Advocate Encounter   Received notification from Onbase that prior authorization for Zepbound  2.5mg /0.65ml Pen is required/requested.   Insurance verification completed.   The patient is insured through [facility] .   Per test claim:  Wegovy   is preferred by the insurance.  If suggested medication is appropriate, Please send in a new RX and discontinue this one. If not, please advise as to why it's not appropriate so that we may request a Prior Authorization. Please note, some preferred medications may still require a PA.  If the suggested medications have not been trialed and there are no contraindications to their use, the PA will not be submitted, as it will not be approved.

## 2024-07-13 ENCOUNTER — Other Ambulatory Visit (HOSPITAL_COMMUNITY): Payer: Self-pay

## 2024-07-13 NOTE — Telephone Encounter (Signed)
 Thank you so much for catching that! I'll be more thorough in the future, I have submitted the PA with chart notes showing that pt is nauseated on current Wegovy  dose. Thank you again!

## 2024-07-13 NOTE — Telephone Encounter (Signed)
 Pt is currently on Wegovy , PA declined in error.  Pharmacy Patient Advocate Encounter   Received notification from Onbase that prior authorization for Zepbound  2.5MG /0.5ML pen-injectors is required/requested.   Insurance verification completed.   The patient is insured through [facility] .   Per test claim: PA required; PA submitted to above mentioned insurance via Latent Key/confirmation #/EOC [id number] Status is pending

## 2024-07-17 NOTE — Telephone Encounter (Signed)
 PA has been denied, [employer] are becoming increasingly stricter about requiring thorough documentation of lifestyle modifications in the patient's chart at each visit. This includes detailed records of diet recommendations (caloric intake, etc), exercise plans (amount of time/wk, etc), and an emphasis on the patient's commitment to continuing these efforts while on medication.  Without this additional documentation in the chart notes, a prior authorization will most likely be denied.

## 2024-07-20 ENCOUNTER — Other Ambulatory Visit: Payer: Self-pay | Admitting: Pediatrics

## 2024-07-20 DIAGNOSIS — E669 Obesity, unspecified: Secondary | ICD-10-CM

## 2024-07-20 MED ORDER — WEGOVY 2.4 MG/0.75ML ~~LOC~~ SOAJ: 2.4000 mg | SUBCUTANEOUS | 1 refills | Status: DC

## 2024-07-20 NOTE — Progress Notes (Signed)
 Wegovy  sent as we wait to hear for zepbound .  Hadassah [name], MD

## 2024-07-20 NOTE — Telephone Encounter (Unsigned)
 Copied from CRM #[id number]. Topic: Clinical - Prescription Issue >> Jul 20, 2024  9:35 AM Dawna HERO wrote: Reason for CRM: Patient calling about zepbound  script she says that it needs a pre auth form for the pharmacy. Stated she called her insurance and they stated she needs to contact the provider they gave her this number [phone] for provider to call to have the medication expedited. Says she needs it by Monday. Requests a call back or message

## 2024-07-23 ENCOUNTER — Other Ambulatory Visit: Payer: Self-pay | Admitting: Pediatrics

## 2024-07-23 DIAGNOSIS — G4700 Insomnia, unspecified: Secondary | ICD-10-CM

## 2024-07-24 NOTE — Telephone Encounter (Signed)
 Too soon for refill, LRF 10/04/23 for 90 and 3 RF.  Requested Prescriptions  Pending Prescriptions Disp Refills   traZODone  (DESYREL ) 100 MG tablet [Pharmacy Med Name: TRAZODONE  100MG  TABLETS] 90 tablet 3    Sig: TAKE 1/2 TO 1 TABLET(50 TO 100 MG) BY MOUTH AT BEDTIME     Psychiatry: Antidepressants - Serotonin Modulator Passed - 07/24/2024  2:24 PM      Passed - Completed PHQ-2 or PHQ-9 in the last 360 days      Passed - Valid encounter within last 6 months    Recent Outpatient Visits           1 month ago Attention deficit hyperactivity disorder (ADHD), unspecified ADHD type   [place] Herold Hadassah SQUIBB, MD   4 months ago Polycystic disease, ovaries   [facility] Herold Hadassah SQUIBB, MD   6 months ago Fibromyalgia   [place] [facility] Herold Hadassah SQUIBB, MD

## 2024-07-30 ENCOUNTER — Telehealth: Payer: Self-pay

## 2024-07-30 NOTE — Telephone Encounter (Unsigned)
 Copied from CRM [id number]. Topic: Clinical - Medical Advice >> Jul 30, 2024  3:45 PM Tracy Whitaker wrote: Pt is scheduled for a virtual visit to complete prior auth but want to be sure this appointment date will not cause a delay or stop her current med of Wegovy  while she waits fo Zebound.

## 2024-07-31 NOTE — Telephone Encounter (Signed)
 Handling in sperate tread

## 2024-08-01 ENCOUNTER — Other Ambulatory Visit (HOSPITAL_COMMUNITY): Payer: Self-pay

## 2024-08-02 ENCOUNTER — Telehealth: Payer: Self-pay

## 2024-08-02 ENCOUNTER — Other Ambulatory Visit (HOSPITAL_COMMUNITY): Payer: Self-pay

## 2024-08-02 NOTE — Telephone Encounter (Signed)
 Pharmacy Patient Advocate Encounter   Received notification from Onbase that prior authorization for Wegovy  2.4MG /0.75ML auto-injectors  is required/requested.   Insurance verification completed.   The patient is insured through [employer] .   Per test claim: PA required; PA submitted to above mentioned insurance via Latent Key/confirmation #/EOC [id number] Status is pending

## 2024-08-02 NOTE — Telephone Encounter (Signed)
 Pharmacy Patient Advocate Encounter  Received notification from [employer] that Prior Authorization for Wegovy  2.4MG /0.75ML auto-injectors  has been DENIED.  See denial reason below. No denial letter attached in CMM. Will attach denial letter to Media tab once received.   PA #/Case ID/Reference #: [id number]

## 2024-08-03 NOTE — Telephone Encounter (Signed)
 Unfortunately, per our team's rph, if chart notes were insufficient for medicaid coverage of zepbound , chart notes will likely be deemed insufficient for continuation of wegovy  as well.  Insurance companies are becoming increasingly stricter about requiring thorough documentation of lifestyle modifications in the patient's chart at each visit. This includes detailed records of diet recommendations (caloric intake, etc), exercise plans (amount of time/wk, etc), and an emphasis on the patient's commitment to continuing these efforts while on medication.  Without this additional documentation in the chart notes, a prior authorization will most likely be denied.  Please addend chart notes before attempting PA. Thank you

## 2024-08-06 ENCOUNTER — Other Ambulatory Visit (HOSPITAL_COMMUNITY): Payer: Self-pay

## 2024-08-07 ENCOUNTER — Telehealth (INDEPENDENT_AMBULATORY_CARE_PROVIDER_SITE_OTHER): Admitting: Pediatrics

## 2024-08-07 ENCOUNTER — Encounter: Payer: Self-pay | Admitting: Pediatrics

## 2024-08-07 VITALS — Wt 197.0 lb

## 2024-08-07 DIAGNOSIS — Z6831 Body mass index (BMI) 31.0-31.9, adult: Secondary | ICD-10-CM

## 2024-08-07 DIAGNOSIS — E669 Obesity, unspecified: Secondary | ICD-10-CM | POA: Diagnosis not present

## 2024-08-07 NOTE — Assessment & Plan Note (Signed)
 Most recent BMI 33. Weight 197lbs today. Patient continues to be glp1 candidate by BMI. She was previously on wegovy  but developed GI SE. Recommend appealing for zepbound . - Patient continues to display commitment to healthier lifestyle modifications including mediterrenean eating style, no sugary drinks and low carb low fat diet - She walks every day as main form of exercises however her chronic arthritic pain limits further mobility. Of note, this has improved with weight loss

## 2024-08-07 NOTE — Progress Notes (Signed)
 Telehealth Visit  I connected with  Tracy Whitaker on 08/07/24 by a video enabled telemedicine application and verified that I am speaking with the correct person using two identifiers.   I discussed the limitations of evaluation and management by telemedicine. The patient expressed understanding and agreed to proceed.  Subjective:    Patient ID: Whitaker LOISE Sharper, female    DOB: 1981/07/08, 43 y.o.   MRN: [id number]  HPI: Tracy Whitaker is a 44 y.o. female  Chief Complaint  Patient presents with   Prior Auth    Pt needs to get a PA for zepbound      Discussed the use of AI scribe software for clinical note transcription with the patient, who gave verbal consent to proceed.  History of Present Illness   Tracy Whitaker is a 43 year old female who presents for insurance documentation regarding her nutrition and exercise regimen.  She follows a nutritional plan that includes avoiding sugar drinks, consuming only water, and adhering to a low-carb diet. She also avoids fast food.  Her exercise routine consists of walking, which she performs most days of the week.  She missed her last scheduled injection last week and was due for her next injection this Monday.  She has experienced adverse side effects with the medication Wegovy  in the past.     Relevant past medical, surgical, family and social history reviewed and updated as indicated. Interim medical history since our last visit reviewed. Allergies and medications reviewed and updated.  ROS per HPI unless specifically indicated above     Objective:    Wt 197 lb (89.4 kg)   BMI 33.81 kg/m   Wt Readings from Last 3 Encounters:  08/07/24 197 lb (89.4 kg)  06/21/24 210 lb 6.4 oz (95.4 kg)  03/20/24 225 lb 3.2 oz (102.2 kg)     Physical Exam Constitutional:      General: She is not in acute distress.    Appearance: Normal appearance.  Neurological:     General: No focal deficit present.     Mental Status: She  is alert. Mental status is at baseline.      LIMITED EXAM GIVEN VIDEO VISIT     Assessment & Plan:  Assessment & Plan   Obesity (BMI 30-39.9) Assessment & Plan: Most recent BMI 33. Weight 197lbs today. Patient continues to be glp1 candidate by BMI. She was previously on wegovy  but developed GI SE. Recommend appealing for zepbound . - Patient continues to display commitment to healthier lifestyle modifications including mediterrenean eating style, no sugary drinks and low carb low fat diet - She walks every day as main form of exercises however her chronic arthritic pain limits further mobility. Of note, this has improved with weight loss     Follow up plan: No follow-ups on file.  Hadassah Tracy Nett, MD   This visit was completed via video visit through MyChart due to the restrictions of the COVID-19 pandemic. All issues as above were discussed and addressed. Physical exam was done as above through visual confirmation on video through MyChart. If it was felt that the patient should be evaluated in the office, they were directed there. The patient verbally consented to this visit.  Location of the patient: home Location of the provider: work Those involved with this call:  Provider: Hadassah Nett, MD CMA: Rollene Cane Time spent on call: 10 minutes on the phone discussing health concerns. 10 minutes total spent in review of patient's record and preparation  of their chart. Total time spent on this encounter: 20 minutes.

## 2024-08-13 ENCOUNTER — Telehealth: Payer: Self-pay

## 2024-08-13 ENCOUNTER — Other Ambulatory Visit (HOSPITAL_COMMUNITY): Payer: Self-pay

## 2024-08-13 NOTE — Telephone Encounter (Signed)
 Pharmacy Patient Advocate Encounter   Received notification from Onbase that prior authorization for Zepbound  2.5MG /0.5ML pen-injectors  is required/requested.   Insurance verification completed.   The patient is insured through [employer] .   Per test claim: PA required; PA submitted to above mentioned insurance via Latent Key/confirmation #/EOC [id number] Status is pending

## 2024-08-14 ENCOUNTER — Other Ambulatory Visit (HOSPITAL_COMMUNITY): Payer: Self-pay

## 2024-08-14 NOTE — Telephone Encounter (Signed)
 Pharmacy Patient Advocate Encounter  Received notification from [employer] that Prior Authorization for Zepbound  2.5MG /0.5ML pen-injectors  has been APPROVED from 08/13/24 to 02/09/25   PA #/Case ID/Reference #: [id number]

## 2024-09-06 ENCOUNTER — Other Ambulatory Visit: Payer: Self-pay | Admitting: Pediatrics

## 2024-09-06 DIAGNOSIS — F909 Attention-deficit hyperactivity disorder, unspecified type: Secondary | ICD-10-CM

## 2024-09-06 MED ORDER — AMPHETAMINE-DEXTROAMPHETAMINE 20 MG PO TABS
20.0000 mg | ORAL_TABLET | Freq: Three times a day (TID) | ORAL | 0 refills | Status: AC
Start: 2024-11-05 — End: 2024-12-05

## 2024-09-06 MED ORDER — AMPHETAMINE-DEXTROAMPHETAMINE 20 MG PO TABS
20.0000 mg | ORAL_TABLET | Freq: Three times a day (TID) | ORAL | 0 refills | Status: AC
Start: 2024-10-06 — End: 2024-11-05

## 2024-09-06 MED ORDER — AMPHETAMINE-DEXTROAMPHETAMINE 20 MG PO TABS: 20.0000 mg | ORAL_TABLET | Freq: Three times a day (TID) | ORAL | 0 refills | Status: AC

## 2024-09-06 NOTE — Progress Notes (Signed)
 Sent next 3 mo adderall refill.  Tracy [name], MD

## 2024-09-13 ENCOUNTER — Other Ambulatory Visit: Payer: Self-pay | Admitting: Pediatrics

## 2024-09-13 DIAGNOSIS — E669 Obesity, unspecified: Secondary | ICD-10-CM

## 2024-09-13 NOTE — Telephone Encounter (Unsigned)
 Copied from CRM #[id number]. Topic: Clinical - Medication Refill >> Sep 13, 2024  4:16 PM Leah C wrote: Medication: tirzepatide  (ZEPBOUND ) 2.5 MG/0.5ML Pen  Has the patient contacted their pharmacy? Yes  (Agent: If no, request that the patient contact the pharmacy for the refill. If patient does not wish to contact the pharmacy document the reason why and proceed with request.) (Agent: If yes, when and what did the pharmacy advise?)  This is the patient's preferred pharmacy:  [id number] [place], [place] - [address] AT [facility] OF [address] [place] [address] Phone: [phone] Fax: [phone]   Is this the correct pharmacy for this prescription? Yes If no, delete pharmacy and type the correct one.   Has the prescription been filled recently? Yes  Is the patient out of the medication? Patient is out of zepbound  completely.   Has the patient been seen for an appointment in the last year OR does the patient have an upcoming appointment? Yes  Can we respond through MyChart? Yes  Agent: Please be advised that Rx refills may take up to 3 business days. We ask that you follow-up with your pharmacy.

## 2024-09-14 ENCOUNTER — Other Ambulatory Visit: Payer: Self-pay | Admitting: Pediatrics

## 2024-09-14 DIAGNOSIS — E669 Obesity, unspecified: Secondary | ICD-10-CM

## 2024-09-14 MED ORDER — TIRZEPATIDE-WEIGHT MANAGEMENT 5 MG/0.5ML ~~LOC~~ SOLN: 5.0000 mg | SUBCUTANEOUS | 0 refills | Status: DC

## 2024-09-14 MED ORDER — TIRZEPATIDE-WEIGHT MANAGEMENT 7.5 MG/0.5ML ~~LOC~~ SOLN: 7.5000 mg | SUBCUTANEOUS | 0 refills | Status: AC

## 2024-09-14 MED ORDER — TIRZEPATIDE-WEIGHT MANAGEMENT 10 MG/0.5ML ~~LOC~~ SOLN: 10.0000 mg | SUBCUTANEOUS | 2 refills | Status: DC

## 2024-09-14 NOTE — Progress Notes (Signed)
 Sent zepbound  refills. Unclear if insurance will cover  Tracy [name], MD

## 2024-09-14 NOTE — Telephone Encounter (Signed)
 Already refilled on 09/14/24 in a separate refill encounter.  Requested Prescriptions  Pending Prescriptions Disp Refills   tirzepatide  (ZEPBOUND ) 2.5 MG/0.5ML Pen 2 mL 0    Sig: Inject 2.5 mg into the skin once a week.     Off-Protocol Failed - 09/14/2024 10:42 PM      Failed - Medication not assigned to a protocol, review manually.      Passed - Valid encounter within last 12 months    Recent Outpatient Visits           1 month ago Obesity (BMI 30-39.9)   [place] [facility] Herold Hadassah SQUIBB, MD   2 months ago Attention deficit hyperactivity disorder (ADHD), unspecified ADHD type   [place] [facility] Herold Hadassah SQUIBB, MD   5 months ago Polycystic disease, ovaries   [place] [facility] Herold Hadassah SQUIBB, MD   8 months ago Fibromyalgia    [facility] Herold Hadassah SQUIBB, MD

## 2024-09-20 ENCOUNTER — Telehealth: Admitting: Pediatrics

## 2024-09-20 ENCOUNTER — Encounter: Payer: Self-pay | Admitting: Pediatrics

## 2024-09-20 VITALS — Ht 64.0 in | Wt 197.0 lb

## 2024-09-20 DIAGNOSIS — E669 Obesity, unspecified: Secondary | ICD-10-CM

## 2024-09-20 DIAGNOSIS — F909 Attention-deficit hyperactivity disorder, unspecified type: Secondary | ICD-10-CM

## 2024-09-20 DIAGNOSIS — R112 Nausea with vomiting, unspecified: Secondary | ICD-10-CM

## 2024-09-20 DIAGNOSIS — Z6833 Body mass index (BMI) 33.0-33.9, adult: Secondary | ICD-10-CM

## 2024-09-20 MED ORDER — ONDANSETRON 4 MG PO TBDP
4.0000 mg | ORAL_TABLET | Freq: Three times a day (TID) | ORAL | 0 refills | Status: AC | PRN
Start: 2024-09-20 — End: ?

## 2024-09-20 NOTE — Progress Notes (Signed)
 Telehealth Visit  I connected with  Tracy Whitaker on 09/24/24 by a video enabled telemedicine application and verified that I am speaking with the correct person using two identifiers.   I discussed the limitations of evaluation and management by telemedicine. The patient expressed understanding and agreed to proceed.  Subjective:    Patient ID: Tracy Whitaker, female    DOB: [birth year], 43 y.o.   MRN: [id number]  HPI: Tracy Whitaker is a 43 y.o. female  Chief Complaint  Patient presents with   ADHD   Weight Management Screening    Discussed the use of AI scribe software for clinical note transcription with the patient, who gave verbal consent to proceed.  History of Present Illness   Tracy Whitaker is a 43 year old female who presents with issues regarding insurance coverage for her weight management treatment.  She is experiencing issues with her insurance coverage for the injectable medication Zepbound , which she had been using for weight management. Her insurance stopped covering the injectables unexpectedly on October 1st, despite having prior approval for six months. She received the first dose of Zepbound  at 2.5 mg, but no further doses have been covered since the change.  She is considering alternative options for weight management, including self-pay options and oral medications that might be covered by insurance. She has previously used phentermine  a long time ago and found it effective, but it cannot be prescribed concurrently with Adderall.  A friend with Medicaid is receiving the injectable through a program called 'cover my meds', which was also used to obtain her previous authorization.  No other changes or issues were reported.     Relevant past medical, surgical, family and social history reviewed and updated as indicated. Interim medical history since our last visit reviewed. Allergies and medications reviewed and updated.  ROS per HPI unless  specifically indicated above     Objective:    Ht 5' 4 (1.626 m)   Wt 197 lb (89.4 kg)   BMI 33.81 kg/m   Wt Readings from Last 3 Encounters:  09/20/24 197 lb (89.4 kg)  08/07/24 197 lb (89.4 kg)  06/21/24 210 lb 6.4 oz (95.4 kg)     Physical Exam Constitutional:      General: She is not in acute distress.    Appearance: Normal appearance.  Neurological:     General: No focal deficit present.     Mental Status: She is alert. Mental status is at baseline.      LIMITED EXAM GIVEN VIDEO VISIT     Assessment & Plan:  Assessment & Plan    Obesity (BMI 30-39.9) Insurance coverage for Zepbound  stopped despite prior approval. Alternatives include self-pay, other injectable programs, or oral medications like Contrave. Phentermine  not an option with concurrent Adderall use. - Investigate insurance coverage for Zepbound  and explore exceptions. - Send message with all weight management options, including injectables and oral medications. - Consider oral medications like Contrave if injectables are not covered. - Evaluate self-pay options and other programs for injectables.  Nausea and vomiting, unspecified vomiting type Zofran  refill requested contingent on continuing injectables. - Refill Zofran  prescription if continuing with injectables. -     Ondansetron ; Take 1 tablet (4 mg total) by mouth every 8 (eight) hours as needed for nausea or vomiting.  Dispense: 12 tablet; Refill: 0   Provider Transition Transition to Darice Petty after resolving weight management issues. - Coordinate transition to Darice Petty after resolving current treatment issues. -  Contact with an update on insurance coverage for Zepbound . - Schedule follow-up appointment after determining treatment plan.    Follow up plan: No follow-ups on file.  Hadassah [name], MD   This visit was completed via video visit through MyChart due to the restrictions of the COVID-19 pandemic. All issues as  above were discussed and addressed. Physical exam was done as above through visual confirmation on video through MyChart. If it was felt that the patient should be evaluated in the office, they were directed there. The patient verbally consented to this visit.  Location of the patient: parking lot Location of the provider: work Those involved with this call:  Provider: Hadassah Nett, MD CMA: Laymon Metro, CMA Time spent on call: 15 minutes with patient face to face via video conference. More than 50% of this time was spent in counseling and coordination of care. 15 minutes total spent in review of patient's record and preparation of their chart. Total time spent on this encounter: 30 minutes.

## 2024-09-24 ENCOUNTER — Other Ambulatory Visit: Payer: Self-pay | Admitting: Pediatrics

## 2024-09-24 ENCOUNTER — Encounter: Payer: Self-pay | Admitting: Pediatrics

## 2024-09-24 DIAGNOSIS — E282 Polycystic ovarian syndrome: Secondary | ICD-10-CM

## 2024-09-24 NOTE — Progress Notes (Signed)
 Called patient and left a message for her to call back to get scheduled with Darice. Needs transfer of care appt scheduled

## 2024-09-25 NOTE — Telephone Encounter (Signed)
 Requested Prescriptions  Pending Prescriptions Disp Refills   CRYSELLE -28 0.3-30 MG-MCG tablet [Pharmacy Med Name: CRYSELLE  TABLETS 28S] 84 tablet 0    Sig: TAKE 1 TABLET BY MOUTH EVERY DAY     OB/GYN:  Contraceptives Passed - 09/25/2024  4:17 PM      Passed - Last BP in normal range    BP Readings from Last 1 Encounters:  06/21/24 103/71         Passed - Valid encounter within last 12 months    Recent Outpatient Visits           5 days ago Obesity (BMI 30-39.9)   [place] [facility] Herold Hadassah SQUIBB, MD   1 month ago Obesity (BMI 30-39.9)   [place] [facility] Herold Hadassah SQUIBB, MD   3 months ago Attention deficit hyperactivity disorder (ADHD), unspecified ADHD type   [place] [facility] Herold Hadassah SQUIBB, MD   6 months ago Polycystic disease, ovaries   [place] [facility] Herold Hadassah SQUIBB, MD   8 months ago Fibromyalgia   [place] [facility] Herold Hadassah SQUIBB, MD              Passed - Patient is not a smoker       spironolactone  (ALDACTONE ) 100 MG tablet [Pharmacy Med Name: SPIRONOLACTONE  100MG  TABLETS] 90 tablet 0    Sig: TAKE 1 TABLET(100 MG) BY MOUTH DAILY     Cardiovascular: Diuretics - Aldosterone Antagonist Failed - 09/25/2024  4:17 PM      Failed - Cr in normal range and within 180 days    Creatinine  Date Value Ref Range Status  02/28/2023 22.0 20.0 - 300.0 mg/dL Final   Creatinine, Ser  Date Value Ref Range Status  08/25/2023 0.91 0.57 - 1.00 mg/dL Final         Failed - K in normal range and within 180 days    Potassium  Date Value Ref Range Status  08/25/2023 4.1 3.5 - 5.2 mmol/L Final         Failed - Na in normal range and within 180 days    Sodium  Date Value Ref Range Status  08/25/2023 141 134 - 144 mmol/L Final         Failed - eGFR is 30 or above and within 180 days    GFR calc Af Amer  Date Value Ref Range Status  10/28/2020 88 >59 mL/min/1.73 Final     Comment:    **In accordance with recommendations from the NKF-ASN Task force,**   [facility] is in the process of updating its eGFR calculation to the   2021 CKD-EPI creatinine equation that estimates kidney function   without a race variable.    GFR calc non Af Amer  Date Value Ref Range Status  10/28/2020 77 >59 mL/min/1.73 Final   eGFR  Date Value Ref Range Status  08/25/2023 81 >59 mL/min/1.73 Final         Passed - Last BP in normal range    BP Readings from Last 1 Encounters:  06/21/24 103/71         Passed - Valid encounter within last 6 months    Recent Outpatient Visits           5 days ago Obesity (BMI 30-39.9)   [place] [facility] Herold Hadassah SQUIBB, MD   1 month ago Obesity (BMI 30-39.9)    [facility] Herold Hadassah  P, MD   3 months ago Attention deficit hyperactivity disorder (ADHD), unspecified ADHD type   [place] [facility] Herold Hadassah SQUIBB, MD   6 months ago Polycystic disease, ovaries   [place] [facility] Herold Hadassah SQUIBB, MD   8 months ago Fibromyalgia   [place] [facility] Herold Hadassah SQUIBB, MD

## 2024-09-25 NOTE — Telephone Encounter (Signed)
 Requested medications are due for refill today.  yes  Requested medications are on the active medications list.  yes  Last refill. 08/19/2024 #90 0 rf  Future visit scheduled.   no  Notes to clinic.  Expired labs.    Requested Prescriptions  Pending Prescriptions Disp Refills   spironolactone  (ALDACTONE ) 100 MG tablet [Pharmacy Med Name: SPIRONOLACTONE  100MG  TABLETS] 90 tablet 0    Sig: TAKE 1 TABLET(100 MG) BY MOUTH DAILY     Cardiovascular: Diuretics - Aldosterone Antagonist Failed - 09/25/2024  4:17 PM      Failed - Cr in normal range and within 180 days    Creatinine  Date Value Ref Range Status  02/28/2023 22.0 20.0 - 300.0 mg/dL Final   Creatinine, Ser  Date Value Ref Range Status  08/25/2023 0.91 0.57 - 1.00 mg/dL Final         Failed - K in normal range and within 180 days    Potassium  Date Value Ref Range Status  08/25/2023 4.1 3.5 - 5.2 mmol/L Final         Failed - Na in normal range and within 180 days    Sodium  Date Value Ref Range Status  08/25/2023 141 134 - 144 mmol/L Final         Failed - eGFR is 30 or above and within 180 days    GFR calc Af Amer  Date Value Ref Range Status  10/28/2020 88 >59 mL/min/1.73 Final    Comment:    **In accordance with recommendations from the NKF-ASN Task force,**   [facility] is in the process of updating its eGFR calculation to the   2021 CKD-EPI creatinine equation that estimates kidney function   without a race variable.    GFR calc non Af Amer  Date Value Ref Range Status  10/28/2020 77 >59 mL/min/1.73 Final   eGFR  Date Value Ref Range Status  08/25/2023 81 >59 mL/min/1.73 Final         Passed - Last BP in normal range    BP Readings from Last 1 Encounters:  06/21/24 103/71         Passed - Valid encounter within last 6 months    Recent Outpatient Visits           5 days ago Obesity (BMI 30-39.9)   [place] [facility] Herold Hadassah SQUIBB, MD   1 month ago Obesity (BMI 30-39.9)    [place] [facility] Herold Hadassah SQUIBB, MD   3 months ago Attention deficit hyperactivity disorder (ADHD), unspecified ADHD type   [place] [facility] Herold Hadassah SQUIBB, MD   6 months ago Polycystic disease, ovaries   [place] [facility] Herold Hadassah SQUIBB, MD   8 months ago Fibromyalgia    [facility] Herold Hadassah SQUIBB, MD              Signed Prescriptions Disp Refills   CRYSELLE -28 0.3-30 MG-MCG tablet 84 tablet 0    Sig: TAKE 1 TABLET BY MOUTH EVERY DAY     OB/GYN:  Contraceptives Passed - 09/25/2024  4:17 PM      Passed - Last BP in normal range    BP Readings from Last 1 Encounters:  06/21/24 103/71         Passed - Valid encounter within last 12 months    Recent Outpatient Visits           5 days ago  Obesity (BMI 30-39.9)   [place] [facility] Herold Hadassah SQUIBB, MD   1 month ago Obesity (BMI 30-39.9)   [place] [facility] Herold Hadassah SQUIBB, MD   3 months ago Attention deficit hyperactivity disorder (ADHD), unspecified ADHD type    [facility] Herold Hadassah SQUIBB, MD   6 months ago Polycystic disease, ovaries    [place] [facility] Herold Hadassah SQUIBB, MD   8 months ago Fibromyalgia    [place] [facility] Herold Hadassah SQUIBB, MD              Passed - Patient is not a smoker

## 2024-10-02 NOTE — Progress Notes (Signed)
 Called patient and left a message for her to call back to get scheduled. TOC with Darice. In 3 months

## 2024-10-04 NOTE — Progress Notes (Signed)
 3 Attempts made to reach patient for scheduling

## 2024-10-05 ENCOUNTER — Telehealth: Payer: Self-pay

## 2024-10-05 NOTE — Telephone Encounter (Signed)
 Called patient to get her scheduled in the [facility] location to [facility]. Patient was called earlier in the year as well to have this appointment made. I did leave a VM Requesting that the patient gives a call back to schedule that [facility] appt in [place].

## 2024-10-11 DIAGNOSIS — R208 Other disturbances of skin sensation: Secondary | ICD-10-CM | POA: Diagnosis not present

## 2024-10-11 DIAGNOSIS — R4189 Other symptoms and signs involving cognitive functions and awareness: Secondary | ICD-10-CM | POA: Diagnosis not present

## 2024-10-11 DIAGNOSIS — Z1331 Encounter for screening for depression: Secondary | ICD-10-CM | POA: Diagnosis not present

## 2024-10-11 DIAGNOSIS — R251 Tremor, unspecified: Secondary | ICD-10-CM | POA: Diagnosis not present

## 2024-10-11 DIAGNOSIS — R531 Weakness: Secondary | ICD-10-CM | POA: Diagnosis not present

## 2024-10-11 DIAGNOSIS — G629 Polyneuropathy, unspecified: Secondary | ICD-10-CM | POA: Diagnosis not present

## 2024-10-11 DIAGNOSIS — M797 Fibromyalgia: Secondary | ICD-10-CM | POA: Diagnosis not present

## 2024-10-11 DIAGNOSIS — R200 Anesthesia of skin: Secondary | ICD-10-CM | POA: Diagnosis not present

## 2024-10-11 DIAGNOSIS — R202 Paresthesia of skin: Secondary | ICD-10-CM | POA: Diagnosis not present

## 2024-10-17 ENCOUNTER — Other Ambulatory Visit: Payer: Self-pay | Admitting: Pediatrics

## 2024-10-17 DIAGNOSIS — R400 Somnolence: Secondary | ICD-10-CM

## 2024-10-17 DIAGNOSIS — F909 Attention-deficit hyperactivity disorder, unspecified type: Secondary | ICD-10-CM

## 2024-10-17 NOTE — Progress Notes (Signed)
 Referral for psychiatry placed to help patient wean from adderall. Last attempt severely affected mood. Once able to wean interested in starting phentermine .   Chl Mychart Epworth Sleepiness Scale & Sleep Eval Questionnaire Question 10/09/2024 8:08 PM EST - Filed by Patient Sitting and reading? 2 Watching TV? 2 Sitting, inactive in a public place (e.g. a theatre...or a meeting)? 0 Chance of sleeping being a passenger in a vehicle? 2 Lying down to rest in the afternoon when circumstances permit? 3 Sitting and talking to someone? 0 Sitting quietly after lunch (without alcohol) 2 Chance of sleeping stopped at traffic light? 0   Epworth Total Score (range: 0 - 27) 11   Plan to send sleep test to r/o OSA. If qualifies, would like to trial zepbound .  Hadassah [name], MD

## 2024-10-31 ENCOUNTER — Other Ambulatory Visit: Payer: Self-pay | Admitting: Pediatrics

## 2024-10-31 DIAGNOSIS — E669 Obesity, unspecified: Secondary | ICD-10-CM

## 2024-10-31 MED ORDER — NALTREXONE-BUPROPION HCL ER 8-90 MG PO TB12: ORAL_TABLET | ORAL | 0 refills | Status: AC

## 2024-10-31 NOTE — Progress Notes (Signed)
 Sent contrave rx  Tracy [name], MD

## 2024-11-01 ENCOUNTER — Telehealth: Payer: Self-pay | Admitting: Pharmacy Technician

## 2024-11-01 ENCOUNTER — Other Ambulatory Visit (HOSPITAL_COMMUNITY): Payer: Self-pay

## 2024-11-01 NOTE — Telephone Encounter (Signed)
 Pharmacy Patient Advocate Encounter   Received notification from Onbase that prior authorization for Contrave ER 8-90mg  tablets is required/requested.   Insurance verification completed.   The patient is insured through [employer].   Per test claim:Contrave is not covered under the [place] Medicaid program.  Preferred/covered oral weight loss medications are below.

## 2024-11-01 NOTE — Telephone Encounter (Signed)
 Routing to provider to advise.

## 2024-12-15 ENCOUNTER — Other Ambulatory Visit: Payer: Self-pay | Admitting: Nurse Practitioner

## 2024-12-17 NOTE — Telephone Encounter (Signed)
 Requested Prescriptions  Pending Prescriptions Disp Refills   CRYSELLE -28 0.3-30 MG-MCG tablet [Pharmacy Med Name: CRYSELLE  TABLETS 28S] 84 tablet 0    Sig: TAKE 1 TABLET BY MOUTH EVERY DAY     OB/GYN:  Contraceptives Passed - 12/17/2024 12:31 PM      Passed - Last BP in normal range    BP Readings from Last 1 Encounters:  06/21/24 103/71         Passed - Valid encounter within last 12 months    Recent Outpatient Visits           2 months ago Obesity (BMI 30-39.9)   [place] [facility] Herold Hadassah SQUIBB, MD   4 months ago Obesity (BMI 30-39.9)   [place] [facility] [facility] Herold Hadassah SQUIBB, MD   5 months ago Attention deficit hyperactivity disorder (ADHD), unspecified ADHD type   [place] [facility] Herold Hadassah SQUIBB, MD   9 months ago Polycystic disease, ovaries   [place] [facility] Herold Hadassah SQUIBB, MD   11 months ago Fibromyalgia   [place] [facility] Herold Hadassah SQUIBB, MD              Passed - Patient is not a smoker

## 2024-12-21 ENCOUNTER — Other Ambulatory Visit: Payer: Self-pay | Admitting: Nurse Practitioner

## 2024-12-21 NOTE — Telephone Encounter (Signed)
 Too soon for refill, duplicate request.  Requested Prescriptions  Pending Prescriptions Disp Refills   CRYSELLE -28 0.3-30 MG-MCG tablet [Pharmacy Med Name: CRYSELLE  TABLETS 28S] 84 tablet 0    Sig: TAKE 1 TABLET BY MOUTH EVERY DAY     OB/GYN:  Contraceptives Passed - 12/21/2024  4:28 PM      Passed - Last BP in normal range    BP Readings from Last 1 Encounters:  06/21/24 103/71         Passed - Valid encounter within last 12 months    Recent Outpatient Visits           3 months ago Obesity (BMI 30-39.9)   [place] [facility] Herold Hadassah SQUIBB, MD   4 months ago Obesity (BMI 30-39.9)   [place] [facility] Herold Hadassah SQUIBB, MD   6 months ago Attention deficit hyperactivity disorder (ADHD), unspecified ADHD type   [place] [facility] Herold Hadassah SQUIBB, MD   9 months ago Polycystic disease, ovaries   [place] [facility] Herold Hadassah SQUIBB, MD   11 months ago Fibromyalgia   [place] [facility] Herold Hadassah SQUIBB, MD              Passed - Patient is not a smoker
